# Patient Record
Sex: Female | Born: 1935 | Race: White | Hispanic: No | Smoking: Never smoker
Health system: Southern US, Community
[De-identification: ages and names within clinical notes are randomized; demographics above are authoritative.]

## PROBLEM LIST (undated history)

## (undated) DIAGNOSIS — Z87828 Personal history of other (healed) physical injury and trauma: Secondary | ICD-10-CM

## (undated) DIAGNOSIS — F32A Depression, unspecified: Secondary | ICD-10-CM

## (undated) DIAGNOSIS — E039 Hypothyroidism, unspecified: Secondary | ICD-10-CM

## (undated) DIAGNOSIS — F419 Anxiety disorder, unspecified: Secondary | ICD-10-CM

## (undated) DIAGNOSIS — M1990 Unspecified osteoarthritis, unspecified site: Secondary | ICD-10-CM

## (undated) DIAGNOSIS — I10 Essential (primary) hypertension: Secondary | ICD-10-CM

## (undated) DIAGNOSIS — M719 Bursopathy, unspecified: Secondary | ICD-10-CM

## (undated) DIAGNOSIS — K56609 Unspecified intestinal obstruction, unspecified as to partial versus complete obstruction: Secondary | ICD-10-CM

## (undated) DIAGNOSIS — R1310 Dysphagia, unspecified: Secondary | ICD-10-CM

## (undated) DIAGNOSIS — C300 Malignant neoplasm of nasal cavity: Secondary | ICD-10-CM

## (undated) DIAGNOSIS — K5900 Constipation, unspecified: Secondary | ICD-10-CM

## (undated) DIAGNOSIS — M810 Age-related osteoporosis without current pathological fracture: Secondary | ICD-10-CM

## (undated) DIAGNOSIS — M75100 Unspecified rotator cuff tear or rupture of unspecified shoulder, not specified as traumatic: Secondary | ICD-10-CM

## (undated) DIAGNOSIS — K219 Gastro-esophageal reflux disease without esophagitis: Secondary | ICD-10-CM

## (undated) HISTORY — DX: Dysphagia, unspecified: R13.10

## (undated) HISTORY — PX: OTHER SURGICAL HISTORY: SHX169

## (undated) HISTORY — DX: Hypothyroidism, unspecified: E03.9

## (undated) HISTORY — PX: ABDOMINAL HYSTERECTOMY: SHX81

## (undated) HISTORY — PX: ROTATOR CUFF REPAIR: SHX139

## (undated) HISTORY — PX: CHOLECYSTECTOMY: SHX55

## (undated) HISTORY — DX: Personal history of other (healed) physical injury and trauma: Z87.828

## (undated) HISTORY — DX: Age-related osteoporosis without current pathological fracture: M81.0

## (undated) HISTORY — DX: Unspecified intestinal obstruction, unspecified as to partial versus complete obstruction: K56.609

## (undated) HISTORY — PX: JOINT REPLACEMENT: SHX530

## (undated) HISTORY — DX: Malignant neoplasm of nasal cavity: C30.0

## (undated) HISTORY — PX: HERNIA REPAIR: SHX51

## (undated) HISTORY — DX: Bursopathy, unspecified: M71.9

## (undated) HISTORY — DX: Unspecified rotator cuff tear or rupture of unspecified shoulder, not specified as traumatic: M75.100

## (undated) HISTORY — PX: SMALL INTESTINE SURGERY: SHX150

## (undated) HISTORY — DX: Unspecified osteoarthritis, unspecified site: M19.90

## (undated) HISTORY — DX: Constipation, unspecified: K59.00

---
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
## 2011-10-29 DIAGNOSIS — E785 Hyperlipidemia, unspecified: Secondary | ICD-10-CM | POA: Insufficient documentation

## 2011-10-29 DIAGNOSIS — M1990 Unspecified osteoarthritis, unspecified site: Secondary | ICD-10-CM | POA: Insufficient documentation

## 2011-10-29 DIAGNOSIS — D649 Anemia, unspecified: Secondary | ICD-10-CM | POA: Insufficient documentation

## 2011-10-29 DIAGNOSIS — E538 Deficiency of other specified B group vitamins: Secondary | ICD-10-CM | POA: Insufficient documentation

## 2011-10-29 DIAGNOSIS — J302 Other seasonal allergic rhinitis: Secondary | ICD-10-CM | POA: Insufficient documentation

## 2011-10-29 DIAGNOSIS — K2100 Gastro-esophageal reflux disease with esophagitis, without bleeding: Secondary | ICD-10-CM | POA: Insufficient documentation

## 2013-05-03 DIAGNOSIS — R609 Edema, unspecified: Secondary | ICD-10-CM | POA: Insufficient documentation

## 2014-02-06 DIAGNOSIS — E039 Hypothyroidism, unspecified: Secondary | ICD-10-CM | POA: Insufficient documentation

## 2014-02-06 DIAGNOSIS — I10 Essential (primary) hypertension: Secondary | ICD-10-CM | POA: Insufficient documentation

## 2016-11-30 ENCOUNTER — Encounter: Payer: Self-pay | Admitting: Obstetrics and Gynecology

## 2016-11-30 ENCOUNTER — Ambulatory Visit (INDEPENDENT_AMBULATORY_CARE_PROVIDER_SITE_OTHER): Payer: 59 | Admitting: Obstetrics and Gynecology

## 2016-11-30 DIAGNOSIS — N8189 Other female genital prolapse: Secondary | ICD-10-CM

## 2016-11-30 DIAGNOSIS — R300 Dysuria: Secondary | ICD-10-CM

## 2016-11-30 DIAGNOSIS — R32 Unspecified urinary incontinence: Secondary | ICD-10-CM | POA: Insufficient documentation

## 2016-11-30 DIAGNOSIS — N952 Postmenopausal atrophic vaginitis: Secondary | ICD-10-CM

## 2016-11-30 MED ORDER — ESTROGENS, CONJUGATED 0.625 MG/GM VA CREA: 1.0000 | TOPICAL_CREAM | Freq: Every day | VAGINAL | 12 refills | Status: DC

## 2016-11-30 NOTE — Progress Notes (Signed)
Kayla Lloyd presents today with c/o vaginal pressure, urinary frequency and dysuria. Pt reports a history of chronic UTI's Was treated for such in Sept and Oct by PCP.  She voids 6-10 times a day and 1-2 times a night She wears pads and has so for the last 2 yrs She has stress and some urge incont sx for the last several yrs Has interfered with her ADL's  She is not sexual active. Denies vaginal bleeding but does have problems at times with hemorrhoids secondary to chronic constipation.   She has a hysterectomy in 1955. She has also had several abd surgeries secondary to what sounds like bowel obstruction and hernia repair.  She moved to [place] over the summer from [place]  Last pap smear was years ago as well as mammogram  She has HTN, hypothyroidism anxiety and hyperlipidemia.  [id number]  TSVD x 8 largest 8 # 15 oz  PE AF VSS Lungs clear  Heart RRR Abd soft obese GU vaginal atrophy cystocele and rectocele noted with relaxation and with valsalva Cervix and uterus absent, + bladder tenderness   A/P Urinary Freq/dysuria        Urinary incont        Vaginal atrophy  Will start Premarin vaginal cream 1/2 applicator twice a week. U/R/B reviewed Will culture urine and treat accordingly F/U in 3-4 weeks. Consider referral to UroGyn

## 2016-12-01 ENCOUNTER — Emergency Department (HOSPITAL_COMMUNITY): Payer: 59

## 2016-12-01 ENCOUNTER — Ambulatory Visit (HOSPITAL_COMMUNITY): Payer: 59

## 2016-12-01 ENCOUNTER — Encounter (HOSPITAL_COMMUNITY): Payer: Self-pay | Admitting: Emergency Medicine

## 2016-12-01 ENCOUNTER — Emergency Department (HOSPITAL_COMMUNITY)
Admission: EM | Admit: 2016-12-01 | Discharge: 2016-12-01 | Discharge status: Against Medical Advice | Payer: 59 | Attending: Emergency Medicine | Admitting: Emergency Medicine

## 2016-12-01 DIAGNOSIS — Z5321 Procedure and treatment not carried out due to patient leaving prior to being seen by health care provider: Secondary | ICD-10-CM | POA: Diagnosis not present

## 2016-12-01 HISTORY — DX: Essential (primary) hypertension: I10
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
## 2016-12-02 ENCOUNTER — Telehealth: Payer: Self-pay | Admitting: *Deleted

## 2016-12-02 NOTE — Telephone Encounter (Signed)
-----   Message from Chancy Milroy, MD sent at 12/02/2016  7:51 AM EST ----- Regarding: FW: Order Clarification I tried to change the original Rx but it would not let me I want the Rx to say 1/2 applicator vaginal twice a week Thanks Legrand Como  ----- Message ----- From: Valene Bors, CMA Sent: 12/01/2016  10:33 AM To: Chancy Milroy, MD Subject: Order Clarification                            Dr Rip Harbour  I received a call from the pharmacy needing clarification on Premarin Cream order. It states to 1 applicator daily and 1/2 applicator twice weekly.  Please clarify and I will make pharmacy aware.  Thanks, Vinnie Level

## 2016-12-02 NOTE — Telephone Encounter (Signed)
Called pharmacy and made aware of Rx clarification.

## 2016-12-29 ENCOUNTER — Encounter: Payer: Self-pay | Admitting: Obstetrics and Gynecology

## 2016-12-29 ENCOUNTER — Ambulatory Visit (INDEPENDENT_AMBULATORY_CARE_PROVIDER_SITE_OTHER): Payer: 59 | Admitting: Obstetrics and Gynecology

## 2016-12-29 VITALS — BP 111/69 | HR 101 | Wt 133.0 lb

## 2016-12-29 DIAGNOSIS — R32 Unspecified urinary incontinence: Secondary | ICD-10-CM | POA: Diagnosis not present

## 2016-12-29 DIAGNOSIS — N952 Postmenopausal atrophic vaginitis: Secondary | ICD-10-CM

## 2016-12-29 NOTE — Progress Notes (Signed)
Patient is in the office for follow up.  Pt seen first of Nov with urinary incont and vaginal atrophy. Started on Premarin vaginal cream twice a week Pt reports Sx have improved since starting the cream. Still some fullness at times, less incont Sx Tolerating Premarin cream without problems  PE  Pt declined  A/P Vaginal atrophy        Urinary incont  Will continue with Premarin cream twice a week. Urine culture from previous visit not performed, will complete today F/U in 6 months.

## 2017-01-01 LAB — URINE CULTURE

## 2017-01-04 ENCOUNTER — Other Ambulatory Visit: Payer: Self-pay

## 2017-01-04 DIAGNOSIS — N390 Urinary tract infection, site not specified: Secondary | ICD-10-CM

## 2017-01-04 MED ORDER — SULFAMETHOXAZOLE-TRIMETHOPRIM 800-160 MG PO TABS: 1.0000 | ORAL_TABLET | Freq: Two times a day (BID) | ORAL | 1 refills | Status: DC

## 2017-01-05 ENCOUNTER — Telehealth: Payer: Self-pay

## 2017-01-05 NOTE — Telephone Encounter (Signed)
Rec'vd TC from pharmacy states Septra interacts with pt's Lorsartan causing abn Potassium levels. Consulted with provider. This is our only option to treat her infection for only 7 days. Pt aware and states she has taken Bactrium and Lorsartan together in the past. She will c/b for lab visit once med is delivered or once abx is complete to check potassium level.

## 2017-01-12 ENCOUNTER — Ambulatory Visit: Payer: 59 | Attending: Internal Medicine | Admitting: Physical Therapy

## 2017-01-12 ENCOUNTER — Encounter: Payer: Self-pay | Admitting: Physical Therapy

## 2017-01-12 DIAGNOSIS — G8929 Other chronic pain: Secondary | ICD-10-CM | POA: Insufficient documentation

## 2017-01-12 DIAGNOSIS — M545 Low back pain: Secondary | ICD-10-CM | POA: Insufficient documentation

## 2017-01-12 DIAGNOSIS — R262 Difficulty in walking, not elsewhere classified: Secondary | ICD-10-CM

## 2017-01-12 NOTE — Therapy (Signed)
[address] [place], [place], [address] Phone: [phone]   Fax:  [phone]  Physical Therapy Evaluation  Patient Details  Name: Kayla Lloyd MRN: [id number] Date of Birth: [birth year] Referring Provider: Velna Hatchet   Encounter Date: 01/12/2017  PT End of Session - 01/12/17 1523    Visit Number  1    Date for PT Re-Evaluation  03/15/17    PT Start Time  1355    PT Stop Time  1440    PT Time Calculation (min)  45 min    Activity Tolerance  Patient tolerated treatment well    Behavior During Therapy  [facility] for tasks assessed/performed       Past Medical History:  Diagnosis Date  . Cancer of septum of nose ([place])   . Hypertension   . Hypothyroidism     Past Surgical History:  Procedure Laterality Date  . hysterecrtomy    . left hip surgery    . ROTATOR CUFF REPAIR Right   . SMALL INTESTINE SURGERY      There were no vitals filed for this visit.   Subjective Assessment - 01/12/17 1400    Subjective  Patient reports 23 years of LBP after a fall.  She reports multiple abdominal surgeries, most recent 1975, the granddaughter is with her and reports to me that she is having urinary issues.  Reports no recent x-rays on the spine.      Limitations  Standing;House hold activities    Patient Stated Goals  have less pain    Currently in Pain?  Yes    Pain Score  1     Pain Location  Back    Pain Orientation  Lower    Pain Descriptors / Indicators  Aching    Pain Type  Chronic pain    Pain Onset  More than a month ago    Pain Frequency  Intermittent    Aggravating Factors   any standing, bending, activity, pain up to 9-10/10, has to have an electric cart for shopping    Pain Relieving Factors  sitting, and resting    Effect of Pain on Daily Activities  limits everything, difficult to go out         [facility] PT Assessment - 01/12/17 0001      Assessment   Medical Diagnosis  LBP    Referring Provider  Velna Hatchet    Onset Date/Surgical Date  12/13/16    Prior Therapy  no      Precautions   Precautions  None      Balance Screen   Has the patient fallen in the past 6 months  Yes    How many times?  1    Has the patient had a decrease in activity level because of a fear of falling?   No    Is the patient reluctant to leave their home because of a fear of falling?   Yes      Home Environment   Additional Comments  lives with granddaughter, has a few steps into the home, does light housework      Prior Function   Level of Independence  Independent with household mobility with device    Vocation  Retired      Mining engineer Comments  fwd head, rounded shoulders      ROM / Strength   AROM / PROM / Strength  AROM;Strength  Strength   Overall Strength Comments  4-/5 for the LE's with mild pain, it was very difficult for her to activate her abs      Ambulation/Gait   Gait Comments  uses a SPC, slow shuffling gait, has uneven hips      Standardized Balance Assessment   Standardized Balance Assessment  Timed Up and Go Test      Timed Up and Go Test   Normal TUG (seconds)  33             Objective measurements completed on examination: See above findings.              PT Education - 01/12/17 1523    Education provided  Yes    Education Details  gave HEP about Kegel's, Feet on ball K2C, trunk rotation and isometric abdominals    Person(s) Educated  Patient;Child(ren)    Methods  Explanation;Demonstration;Handout    Comprehension  Verbalized understanding       PT Short Term Goals - 01/12/17 1527      PT SHORT TERM GOAL #1   Title  independent with initial HEP    Time  2    Period  Weeks    Status  New        PT Long Term Goals - 01/12/17 1527      PT LONG TERM GOAL #1   Title  decrease TUG time to 20 seconds    Time  8    Period  Weeks    Status  New      PT LONG TERM GOAL #2   Title  decrease  pain 25% with standing    Time  8    Period  Weeks    Status  New      PT LONG TERM GOAL #3   Title  independent and safe with TENS    Time  8    Period  Weeks    Status  New      PT LONG TERM GOAL #4   Title  report able to stand to cook a meal without difficulty    Time  8    Period  Weeks    Status  New             Plan - 01/12/17 1524    Clinical Impression Statement  Patient reports LBP for 23 years, she reports moving recently to live with granddaughter.  She has a shuffling gait, TUG was 33 seconds, is unsteady on feet.  She has a great difficulty activating her abdominal mms, she reports many abdominal surgeries and recent incontinence.  She has tight LE's    Clinical Presentation  Stable    Clinical Decision Making  Low    Rehab Potential  Fair    PT Frequency  2x / week    PT Duration  8 weeks    PT Treatment/Interventions  ADLs/Self Care Home Management;Electrical Stimulation;Moist Heat;Gait training;Neuromuscular re-education;Balance training;Therapeutic exercise;Therapeutic activities;Functional mobility training;Patient/family education;Manual techniques    PT Next Visit Plan  pateint reports having a TENS but they are unsure how to use it, start exercises for core stability, work on balance too    Consulted and Agree with Plan of Care  Patient       Patient will benefit from skilled therapeutic intervention in order to improve the following deficits and impairments:  Abnormal gait, Cardiopulmonary status limiting activity, Decreased activity tolerance, Decreased balance, Decreased mobility, Decreased strength, Postural dysfunction, Improper body  mechanics, Impaired flexibility, Pain, Difficulty walking, Decreased range of motion  Visit Diagnosis: Chronic bilateral low back pain without sciatica - Plan: PT plan of care cert/re-cert  Difficulty in walking, not elsewhere classified - Plan: PT plan of care cert/re-cert  G-Codes - 95/39/67 1529    Functional  Assessment Tool Used (Outpatient Only)  foto 63%    Functional Limitation  Mobility: Walking and moving around    Mobility: Walking and Moving Around Current Status [phone])  At least 60 percent but less than 80 percent impaired, limited or restricted    Mobility: Walking and Moving Around Goal Status [phone])  At least 40 percent but less than 60 percent impaired, limited or restricted        Problem List Patient Active Problem List   Diagnosis Date Noted  . Urinary incontinence in female 11/30/2016  . Pelvic relaxation 11/30/2016  . Dysuria 11/30/2016  . Vaginal atrophy 11/30/2016    Sumner Boast., PT 01/12/2017, 3:33 PM  [address] [place], [place], [address] Phone: [phone]   Fax:  [phone]  Name: Kayla Lloyd MRN: [id number] Date of Birth: 1923-10-17

## 2017-01-13 ENCOUNTER — Telehealth: Payer: Self-pay | Admitting: Pediatrics

## 2017-01-13 DIAGNOSIS — Z79899 Other long term (current) drug therapy: Secondary | ICD-10-CM

## 2017-01-13 NOTE — Telephone Encounter (Signed)
Pt left message on nurse line stating she has finished her ATB and would like to make appointment for her follow up lab work.  Per previous note pt to have K drawn after she finished Bactrim.  I called patient back and offered lab appointment for next week.  She states she will have to call me back when her great grand daughter wakes up, she will be bringing her.  I advised of next weeks office hours. She voiced understanding and states she will call back today.

## 2017-01-19 ENCOUNTER — Other Ambulatory Visit: Payer: 59

## 2017-01-19 DIAGNOSIS — Z79899 Other long term (current) drug therapy: Secondary | ICD-10-CM

## 2017-01-20 LAB — BASIC METABOLIC PANEL
BUN / CREAT RATIO: 20 (ref 12–28)
BUN: 13 mg/dL (ref 10–36)
CALCIUM: 9.5 mg/dL (ref 8.7–10.3)
CHLORIDE: 98 mmol/L (ref 96–106)
CO2: 26 mmol/L (ref 20–29)
Creatinine, Ser: 0.66 mg/dL (ref 0.57–1.00)
GFR calc non Af Amer: 77 mL/min/{1.73_m2} (ref >59)
GFR, EST AFRICAN AMERICAN: 89 mL/min/{1.73_m2} (ref >59)
GLUCOSE: 96 mg/dL (ref 65–99)
POTASSIUM: 4.8 mmol/L (ref 3.5–5.2)
Sodium: 138 mmol/L (ref 134–144)

## 2017-02-07 ENCOUNTER — Encounter: Payer: Self-pay | Admitting: Physical Therapy

## 2017-02-07 ENCOUNTER — Ambulatory Visit: Payer: 59 | Attending: Internal Medicine | Admitting: Physical Therapy

## 2017-02-07 DIAGNOSIS — G8929 Other chronic pain: Secondary | ICD-10-CM | POA: Diagnosis present

## 2017-02-07 DIAGNOSIS — R262 Difficulty in walking, not elsewhere classified: Secondary | ICD-10-CM | POA: Diagnosis not present

## 2017-02-07 DIAGNOSIS — M545 Low back pain: Secondary | ICD-10-CM | POA: Diagnosis present

## 2017-02-07 NOTE — Therapy (Signed)
[address] [place], [place], [address] Phone: [phone]   Fax:  [phone]  Physical Therapy Treatment  Patient Details  Name: Kayla Lloyd MRN: [id number] Date of Birth: Mar 01, 1923 Referring Provider: Velna Hatchet   Encounter Date: 02/07/2017  PT End of Session - 02/07/17 1521    Visit Number  2    Date for PT Re-Evaluation  03/15/17    PT Start Time  1430    PT Stop Time  1519    PT Time Calculation (min)  49 min       Past Medical History:  Diagnosis Date  . Cancer of septum of nose ([place])   . Hypertension   . Hypothyroidism     Past Surgical History:  Procedure Laterality Date  . hysterecrtomy    . left hip surgery    . ROTATOR CUFF REPAIR Right   . SMALL INTESTINE SURGERY      There were no vitals filed for this visit.  Subjective Assessment - 02/07/17 1431    Subjective  Pt reports that's her days start off in pain, reports its take a few hours to get around and get going.     Currently in Pain?  No/denies    Pain Score  0-No pain                      [facility] Adult PT Treatment/Exercise - 02/07/17 0001      High Level Balance   High Level Balance Activities  Side stepping HHA x 2      Exercises   Exercises  Lumbar      Lumbar Exercises: Aerobic   Nustep  L1 x 5 min      Lumbar Exercises: Standing   Other Standing Lumbar Exercises  march x10 CGA & HHA x2     Other Standing Lumbar Exercises  Alt box taps 2in 2x5       Lumbar Exercises: Seated   Sit to Stand  5 reps RUE use     Other Seated Lumbar Exercises  abd crunches physo ball x10; Seated HS curls yellow 2x10     Other Seated Lumbar Exercises  seated march 2x10; fitter presses 1 blue 2x10               PT Short Term Goals - 02/07/17 1521      PT SHORT TERM GOAL #1   Title  independent with initial HEP    Status  Achieved        PT Long Term Goals - 01/12/17 1527      PT LONG  TERM GOAL #1   Title  decrease TUG time to 20 seconds    Time  8    Period  Weeks    Status  New      PT LONG TERM GOAL #2   Title  decrease pain 25% with standing    Time  8    Period  Weeks    Status  New      PT LONG TERM GOAL #3   Title  independent and safe with TENS    Time  8    Period  Weeks    Status  New      PT LONG TERM GOAL #4   Title  report able to stand to cook a meal without difficulty    Time  8    Period  Weeks  Status  New            Plan - 02/07/17 1521    Clinical Impression Statement  Pt tolerated an initial progression to exercises well, cues not to lean back with seated march. Pt reports a sore sensation in her bones with sit to stand. PT really liked NuStep and fitter pressed. Instructed pt and pt granddaughter on proper TENS use ans set up. Tole pt to avoid LE rotations with LE on physo ball from HEP.   Rehab Potential  Fair    PT Frequency  2x / week    PT Duration  8 weeks    PT Treatment/Interventions  ADLs/Self Care Home Management;Electrical Stimulation;Moist Heat;Gait training;Neuromuscular re-education;Balance training;Therapeutic exercise;Therapeutic activities;Functional mobility training;Patient/family education;Manual techniques    PT Next Visit Plan  exercises for core stability, work on balance too       Patient will benefit from skilled therapeutic intervention in order to improve the following deficits and impairments:  Abnormal gait, Cardiopulmonary status limiting activity, Decreased activity tolerance, Decreased balance, Decreased mobility, Decreased strength, Postural dysfunction, Improper body mechanics, Impaired flexibility, Pain, Difficulty walking, Decreased range of motion  Visit Diagnosis: Difficulty in walking, not elsewhere classified  Chronic bilateral low back pain without sciatica     Problem List Patient Active Problem List   Diagnosis Date Noted  . Urinary incontinence in female 11/30/2016  . Pelvic  relaxation 11/30/2016  . Dysuria 11/30/2016  . Vaginal atrophy 11/30/2016    Scot Jun 02/07/2017, 3:23 PM  [address] [place], [place], [address] Phone: [phone]   Fax:  [phone]  Name: Kayla Lloyd MRN: [id number] Date of Birth: 1923/12/31

## 2017-02-14 ENCOUNTER — Encounter: Payer: Self-pay | Admitting: Physical Therapy

## 2017-02-14 ENCOUNTER — Ambulatory Visit: Payer: 59 | Admitting: Physical Therapy

## 2017-02-14 DIAGNOSIS — M545 Low back pain, unspecified: Secondary | ICD-10-CM

## 2017-02-14 DIAGNOSIS — R262 Difficulty in walking, not elsewhere classified: Secondary | ICD-10-CM

## 2017-02-14 DIAGNOSIS — G8929 Other chronic pain: Secondary | ICD-10-CM

## 2017-02-14 NOTE — Therapy (Signed)
[address] [place], [place], [address] Phone: [phone]   Fax:  [phone]  Physical Therapy Treatment  Patient Details  Name: Kayla Lloyd MRN: [id number] Date of Birth: 04/07/1923 Referring Provider: Velna Hatchet   Encounter Date: 02/14/2017  PT End of Session - 02/14/17 1426    Visit Number  3    Date for PT Re-Evaluation  03/15/17    PT Start Time  1345    PT Stop Time  1430    PT Time Calculation (min)  45 min    Activity Tolerance  Patient tolerated treatment well    Behavior During Therapy  [facility] for tasks assessed/performed       Past Medical History:  Diagnosis Date  . Cancer of septum of nose ([place])   . Hypertension   . Hypothyroidism     Past Surgical History:  Procedure Laterality Date  . hysterecrtomy    . left hip surgery    . ROTATOR CUFF REPAIR Right   . SMALL INTESTINE SURGERY      There were no vitals filed for this visit.  Subjective Assessment - 02/14/17 1351    Subjective  Pt reports that's she is doing fine. Pt's granddaughter stated that's he said the exercises hurt her back and it was hurting last night, But Pt reported that she has not done her exercises since last week. Pt stated that laying in bed hurt her back last night    Currently in Pain?  No/denies    Pain Score  0-No pain                      [facility] Adult PT Treatment/Exercise - 02/14/17 0001      High Level Balance   High Level Balance Activities  Side stepping;Backward walking HHA x2       Lumbar Exercises: Aerobic   Nustep  L3 x 6 min      Lumbar Exercises: Seated   Long Arc Quad on Chair  2 sets;10 reps;Weights;Both    LAQ on Chair Weights (lbs)  2    Sit to Stand  5 reps x2, airex on mat table, UE on knees     Other Seated Lumbar Exercises   Seated HS curls red 2x12     Other Seated Lumbar Exercises  seated march 2x10; fitter presses 1 black 2x10                PT Short Term Goals - 02/07/17 1521      PT SHORT TERM GOAL #1   Title  independent with initial HEP    Status  Achieved        PT Long Term Goals - 01/12/17 1527      PT LONG TERM GOAL #1   Title  decrease TUG time to 20 seconds    Time  8    Period  Weeks    Status  New      PT LONG TERM GOAL #2   Title  decrease pain 25% with standing    Time  8    Period  Weeks    Status  New      PT LONG TERM GOAL #3   Title  independent and safe with TENS    Time  8    Period  Weeks    Status  New      PT LONG TERM GOAL #4   Title  report able to stand to cook a meal without difficulty    Time  8    Period  Weeks    Status  New            Plan - 02/14/17 1426    Clinical Impression Statement  Pt does well with today's exercises, she was able to tolerated increase resistance with fitter press HS curls, and LAQ. No reports of increase pain. HHA x2 with balance activities.    PT Frequency  2x / week    PT Duration  8 weeks    PT Treatment/Interventions  ADLs/Self Care Home Management;Electrical Stimulation;Moist Heat;Gait training;Neuromuscular re-education;Balance training;Therapeutic exercise;Therapeutic activities;Functional mobility training;Patient/family education;Manual techniques    PT Next Visit Plan  exercises for core stability, work on balance too       Patient will benefit from skilled therapeutic intervention in order to improve the following deficits and impairments:  Abnormal gait, Cardiopulmonary status limiting activity, Decreased activity tolerance, Decreased balance, Decreased mobility, Decreased strength, Postural dysfunction, Improper body mechanics, Impaired flexibility, Pain, Difficulty walking, Decreased range of motion  Visit Diagnosis: Difficulty in walking, not elsewhere classified  Chronic bilateral low back pain without sciatica     Problem List Patient Active Problem List   Diagnosis Date Noted  . Urinary  incontinence in female 11/30/2016  . Pelvic relaxation 11/30/2016  . Dysuria 11/30/2016  . Vaginal atrophy 11/30/2016    Scot Jun, PTA 02/14/2017, 2:28 PM  [address] [place], [place], [address] Phone: [phone]   Fax:  [phone]  Name: Harvey Lingo MRN: [id number] Date of Birth: Jan 29, 1923

## 2017-02-17 ENCOUNTER — Ambulatory Visit: Payer: 59 | Admitting: Physical Therapy

## 2017-02-17 ENCOUNTER — Encounter: Payer: Self-pay | Admitting: Physical Therapy

## 2017-02-17 DIAGNOSIS — R262 Difficulty in walking, not elsewhere classified: Secondary | ICD-10-CM

## 2017-02-17 DIAGNOSIS — M545 Low back pain: Principal | ICD-10-CM

## 2017-02-17 DIAGNOSIS — G8929 Other chronic pain: Secondary | ICD-10-CM

## 2017-02-17 NOTE — Therapy (Signed)
[address] [place], [place], [address] Phone: [phone]   Fax:  [phone]  Physical Therapy Treatment  Patient Details  Name: Kayla Lloyd MRN: [id number] Date of Birth: Dec 02, 1923 Referring Provider: Velna Hatchet   Encounter Date: 02/17/2017  PT End of Session - 02/17/17 1426    Visit Number  4    Date for PT Re-Evaluation  03/15/17    PT Start Time  1340    PT Stop Time  1430    PT Time Calculation (min)  50 min    Activity Tolerance  Patient tolerated treatment well    Behavior During Therapy  [facility] for tasks assessed/performed       Past Medical History:  Diagnosis Date  . Cancer of septum of nose ([place])   . Hypertension   . Hypothyroidism     Past Surgical History:  Procedure Laterality Date  . hysterecrtomy    . left hip surgery    . ROTATOR CUFF REPAIR Right   . SMALL INTESTINE SURGERY      There were no vitals filed for this visit.  Subjective Assessment - 02/17/17 1343    Subjective  "I have been doing pretty good"    Currently in Pain?  No/denies    Pain Score  0-No pain                      [facility] Adult PT Treatment/Exercise - 02/17/17 0001      High Level Balance   High Level Balance Activities  Side stepping;Backward walking      Lumbar Exercises: Aerobic   Nustep  L3 x 6 min      Lumbar Exercises: Standing   Other Standing Lumbar Exercises  marching       Lumbar Exercises: Seated   Long Arc Quad on Chair  2 sets;10 reps;Weights;Both    LAQ on Chair Weights (lbs)  2    Sit to Stand  5 reps UE on legs,cue to keep LE from pushing on mat table     Other Seated Lumbar Exercises   Seated HS curls red 2x12; rows red 2x10    Other Seated Lumbar Exercises  ball squeezes 2x10                PT Short Term Goals - 02/07/17 1521      PT SHORT TERM GOAL #1   Title  independent with initial HEP    Status  Achieved        PT Long Term  Goals - 02/17/17 1353      PT LONG TERM GOAL #2   Title  decrease pain 25% with standing    Status  Partially Met      PT LONG TERM GOAL #3   Title  independent and safe with TENS    Status  Achieved            Plan - 02/17/17 1427    Clinical Impression Statement  Pt is progressing towards goals, she becomes very fatigue with functional activities. CGA assist with all balance interventions. No reports of increase pain. One restroom break during today's treatment.    Rehab Potential  Fair    PT Frequency  2x / week    PT Duration  8 weeks    PT Treatment/Interventions  ADLs/Self Care Home Management;Electrical Stimulation;Moist Heat;Gait training;Neuromuscular re-education;Balance training;Therapeutic exercise;Therapeutic activities;Functional mobility training;Patient/family education;Manual techniques    PT Next  Visit Plan  exercises for core stability, work on balance too       Patient will benefit from skilled therapeutic intervention in order to improve the following deficits and impairments:  Abnormal gait, Cardiopulmonary status limiting activity, Decreased activity tolerance, Decreased balance, Decreased mobility, Decreased strength, Postural dysfunction, Improper body mechanics, Impaired flexibility, Pain, Difficulty walking, Decreased range of motion  Visit Diagnosis: Chronic bilateral low back pain without sciatica  Difficulty in walking, not elsewhere classified     Problem List Patient Active Problem List   Diagnosis Date Noted  . Urinary incontinence in female 11/30/2016  . Pelvic relaxation 11/30/2016  . Dysuria 11/30/2016  . Vaginal atrophy 11/30/2016    Scot Jun, PTA 02/17/2017, 2:29 PM  [address] [place], [place], [address] Phone: [phone]   Fax:  [phone]  Name: Kayla Lloyd MRN: [id number] Date of Birth: [birth year]

## 2017-02-23 ENCOUNTER — Ambulatory Visit: Payer: 59 | Admitting: Physical Therapy

## 2017-02-23 DIAGNOSIS — G8929 Other chronic pain: Secondary | ICD-10-CM

## 2017-02-23 DIAGNOSIS — R262 Difficulty in walking, not elsewhere classified: Secondary | ICD-10-CM | POA: Diagnosis not present

## 2017-02-23 DIAGNOSIS — M545 Low back pain: Principal | ICD-10-CM

## 2017-02-23 NOTE — Therapy (Signed)
[place], [place], [address] Phone: [phone]   Fax:  [phone]  Physical Therapy Treatment  Patient Details  Name: Kayla Lloyd MRN: [id number] Date of Birth: 1923/03/23 Referring Provider: Velna Hatchet   Encounter Date: 02/23/2017  PT End of Session - 02/23/17 1453    Visit Number  5    Date for PT Re-Evaluation  03/15/17    PT Start Time  1447    PT Stop Time  1525    PT Time Calculation (min)  38 min    Activity Tolerance  Patient tolerated treatment well    Behavior During Therapy  [facility] for tasks assessed/performed       Past Medical History:  Diagnosis Date  . Cancer of septum of nose ([place])   . Hypertension   . Hypothyroidism     Past Surgical History:  Procedure Laterality Date  . hysterecrtomy    . left hip surgery    . ROTATOR CUFF REPAIR Right   . SMALL INTESTINE SURGERY      There were no vitals filed for this visit.  Subjective Assessment - 02/23/17 1453    Subjective  Pt. doing well today, with no new complaints.      Patient Stated Goals  have less pain    Currently in Pain?  No/denies    Pain Score  0-No pain    Multiple Pain Sites  No                      [facility] Adult PT Treatment/Exercise - 02/23/17 1455      Neuro Re-ed    Neuro Re-ed Details   Side stepping with 2 HH assist from therapist 2 x 20 ft; forward/backward walk with 2 HH assist x 20 ft each       Lumbar Exercises: Aerobic   Nustep  L3 x 6 min      Lumbar Exercises: Standing   Other Standing Lumbar Exercises  Alternating toe-clears to "edge of rebounder" x 10 reps each LE with SPC and CGA from therapist       Lumbar Exercises: [place] on Chair  2 sets;10 reps;Weights;Both    LAQ on Chair Weights (lbs)  2    Sit to Stand  5 reps 2 sets; UE pushoff from knees     Other Seated Lumbar Exercises  Adduction ball squeeze 5" x 10 reps     Other Seated Lumbar  Exercises  Seated march x 15 reps each side       Knee/Hip Exercises: Seated   Other Seated Knee/Hip Exercises  Toe raises x 10 reps     Other Seated Knee/Hip Exercises  Heel raise x 10 reps     Hamstring Curl  Right;Left 12reps    Hamstring Limitations  red TB       Manual Therapy   Manual Therapy  Soft tissue mobilization    Manual therapy comments  seated     Soft tissue mobilization  STM to R rhomboids area over area of reported tenderness                PT Short Term Goals - 02/07/17 1521      PT SHORT TERM GOAL #1   Title  independent with initial HEP    Status  Achieved        PT Long Term Goals - 02/17/17 1353  PT LONG TERM GOAL #2   Title  decrease pain 25% with standing    Status  Partially Met      PT LONG TERM GOAL #3   Title  independent and safe with TENS    Status  Achieved            Plan - 02/23/17 1454    Clinical Impression Statement  Pt. doing well today with no new complaints.  Tolerated mild progression of LE strengthening and addition of staggered stance balance well today.  Pt. requiring CGA assist with all standing balance activities today.  Verbalized fatigue following session today.  Seems to be progressing well toward goals at this point.    PT Treatment/Interventions  ADLs/Self Care Home Management;Electrical Stimulation;Moist Heat;Gait training;Neuromuscular re-education;Balance training;Therapeutic exercise;Therapeutic activities;Functional mobility training;Patient/family education;Manual techniques    PT Next Visit Plan  exercises for core stability, work on balance too    Consulted and Agree with Plan of Care  Patient       Patient will benefit from skilled therapeutic intervention in order to improve the following deficits and impairments:  Abnormal gait, Cardiopulmonary status limiting activity, Decreased activity tolerance, Decreased balance, Decreased mobility, Decreased strength, Postural dysfunction, Improper body  mechanics, Impaired flexibility, Pain, Difficulty walking, Decreased range of motion  Visit Diagnosis: Chronic bilateral low back pain without sciatica  Difficulty in walking, not elsewhere classified     Problem List Patient Active Problem List   Diagnosis Date Noted  . Urinary incontinence in female 11/30/2016  . Pelvic relaxation 11/30/2016  . Dysuria 11/30/2016  . Vaginal atrophy 11/30/2016    Bess Harvest, PTA 02/23/17 3:34 PM  [place] [address] [place], [place], [address] Phone: [phone]   Fax:  [phone]  Name: Kayla Lloyd MRN: [id number] Date of Birth: 1923-02-26

## 2017-02-28 ENCOUNTER — Ambulatory Visit: Payer: 59 | Attending: Internal Medicine | Admitting: Physical Therapy

## 2017-02-28 ENCOUNTER — Encounter: Payer: Self-pay | Admitting: Physical Therapy

## 2017-02-28 DIAGNOSIS — R262 Difficulty in walking, not elsewhere classified: Secondary | ICD-10-CM | POA: Diagnosis present

## 2017-02-28 DIAGNOSIS — M545 Low back pain, unspecified: Secondary | ICD-10-CM

## 2017-02-28 DIAGNOSIS — G8929 Other chronic pain: Secondary | ICD-10-CM | POA: Diagnosis present

## 2017-02-28 NOTE — Therapy (Signed)
[place], [place], [address] Phone: [phone]   Fax:  [phone]  Physical Therapy Treatment  Patient Details  Name: Kayla Lloyd MRN: [id number] Date of Birth: 1923/01/26 Referring Provider: Velna Hatchet   Encounter Date: 02/28/2017  PT End of Session - 02/28/17 1426    Visit Number  6    Date for PT Re-Evaluation  03/15/17    PT Start Time  1350    PT Stop Time  1430    PT Time Calculation (min)  40 min    Activity Tolerance  Patient tolerated treatment well    Behavior During Therapy  [facility] for tasks assessed/performed       Past Medical History:  Diagnosis Date  . Cancer of septum of nose ([place])   . Hypertension   . Hypothyroidism     Past Surgical History:  Procedure Laterality Date  . hysterecrtomy    . left hip surgery    . ROTATOR CUFF REPAIR Right   . SMALL INTESTINE SURGERY      There were no vitals filed for this visit.  Subjective Assessment - 02/28/17 1355    Subjective  Pt reports that she drove today and she rode her exercise bike last night for twenty minutes    Currently in Pain?  No/denies    Pain Score  0-No pain         [facility] PT Assessment - 02/28/17 0001      Standardized Balance Assessment   Standardized Balance Assessment  Timed Up and Go Test SPC      Timed Up and Go Test   Normal TUG (seconds)  16.29                  [facility] Adult PT Treatment/Exercise - 02/28/17 0001      High Level Balance   High Level Balance Activities  Side stepping;Backward walking      Lumbar Exercises: Aerobic   Nustep  L3 x 6 min      Lumbar Exercises: Seated   Long Arc Quad on Chair  2 sets;10 reps;Weights;Both    LAQ on Chair Weights (lbs)  3    Sit to Stand  5 reps x3 UE on knees     Other Seated Lumbar Exercises  Adduction ball squeeze 5" x 10 reps     Other Seated Lumbar Exercises  Seated HS curls red tband 2x15               PT  Short Term Goals - 02/07/17 1521      PT SHORT TERM GOAL #1   Title  independent with initial HEP    Status  Achieved        PT Long Term Goals - 02/28/17 1404      PT LONG TERM GOAL #1   Title  decrease TUG time to 20 seconds    Status  Achieved            Plan - 02/28/17 1428    Clinical Impression Statement  Pt with some hesitation with balance exercises without cane or support, but able to completed well. verbal  and tactile cues given to keep his straight with side stepping. Pt has progressed completing his TUG goal. Pt does reports some LE fatigue with curls and extensions.    Rehab Potential  Fair    PT Frequency  2x / week    PT Duration  8  weeks    PT Treatment/Interventions  ADLs/Self Care Home Management;Electrical Stimulation;Moist Heat;Gait training;Neuromuscular re-education;Balance training;Therapeutic exercise;Therapeutic activities;Functional mobility training;Patient/family education;Manual techniques    PT Next Visit Plan  exercises for core stability, work on balance too       Patient will benefit from skilled therapeutic intervention in order to improve the following deficits and impairments:  Abnormal gait, Cardiopulmonary status limiting activity, Decreased activity tolerance, Decreased balance, Decreased mobility, Decreased strength, Postural dysfunction, Improper body mechanics, Impaired flexibility, Pain, Difficulty walking, Decreased range of motion  Visit Diagnosis: Chronic bilateral low back pain without sciatica  Difficulty in walking, not elsewhere classified     Problem List Patient Active Problem List   Diagnosis Date Noted  . Urinary incontinence in female 11/30/2016  . Pelvic relaxation 11/30/2016  . Dysuria 11/30/2016  . Vaginal atrophy 11/30/2016    Scot Jun, PTA 02/28/2017, 2:30 PM  [place] [address] [place], [place], [address] Phone: [phone]    Fax:  [phone]  Name: Kayla Lloyd MRN: [id number] Date of Birth: 04/21/1923

## 2017-03-02 ENCOUNTER — Ambulatory Visit: Payer: 59 | Admitting: Physical Therapy

## 2017-03-02 ENCOUNTER — Encounter: Payer: Self-pay | Admitting: Physical Therapy

## 2017-03-02 DIAGNOSIS — M545 Low back pain: Principal | ICD-10-CM

## 2017-03-02 DIAGNOSIS — G8929 Other chronic pain: Secondary | ICD-10-CM

## 2017-03-02 DIAGNOSIS — R262 Difficulty in walking, not elsewhere classified: Secondary | ICD-10-CM

## 2017-03-02 NOTE — Therapy (Signed)
[place], [place], [address] Phone: [phone]   Fax:  [phone]  Physical Therapy Treatment  Patient Details  Name: Kayla Lloyd MRN: [id number] Date of Birth: [birth year] Referring Provider: Velna Hatchet   Encounter Date: 03/02/2017  PT End of Session - 03/02/17 1428    Visit Number  7    Date for PT Re-Evaluation  03/15/17    PT Start Time  1347    PT Stop Time  1428    PT Time Calculation (min)  41 min    Activity Tolerance  Patient tolerated treatment well    Behavior During Therapy  [facility] for tasks assessed/performed       Past Medical History:  Diagnosis Date  . Cancer of septum of nose ([place])   . Hypertension   . Hypothyroidism     Past Surgical History:  Procedure Laterality Date  . hysterecrtomy    . left hip surgery    . ROTATOR CUFF REPAIR Right   . SMALL INTESTINE SURGERY      There were no vitals filed for this visit.  Subjective Assessment - 03/02/17 1355    Subjective  "I has some sore legs last time I worked here"    Pain Score  5     Pain Location  Shoulder    Pain Orientation  Left    Pain Descriptors / Indicators  Sore                      [facility] Adult PT Treatment/Exercise - 03/02/17 0001      High Level Balance   High Level Balance Activities  Side stepping      Lumbar Exercises: Aerobic   UBE (Upper Arm Bike)  L1 20frd/2rev    Nustep  L2 x 5 min      Lumbar Exercises: Machines for Strengthening   Cybex Knee Extension  5lb 2x10    Cybex Knee Flexion  15lb 2x10      Lumbar Exercises: Standing   Other Standing Lumbar Exercises  standing march HHA x2 2x10       Lumbar Exercises: Seated   Sit to Stand  10 reps x2 from lowered UBE seat     Other Seated Lumbar Exercises  Adduction ball squeeze 2x10 reps; Hip abd x15                PT Short Term Goals - 02/07/17 1521      PT SHORT TERM GOAL #1   Title  independent with  initial HEP    Status  Achieved        PT Long Term Goals - 02/28/17 1404      PT LONG TERM GOAL #1   Title  decrease TUG time to 20 seconds    Status  Achieved            Plan - 03/02/17 1428    Clinical Impression Statement  Progressed pt to some machine level interventions, pt reports that she can really feel her muscles with seated legs and curl and extensions. Better tolerance with sit to stand from a slightly higher seat.     Rehab Potential  Fair    PT Frequency  2x / week    PT Duration  8 weeks    PT Next Visit Plan  exercises for core stability, work on balance too       Patient will benefit from  skilled therapeutic intervention in order to improve the following deficits and impairments:  Abnormal gait, Cardiopulmonary status limiting activity, Decreased activity tolerance, Decreased balance, Decreased mobility, Decreased strength, Postural dysfunction, Improper body mechanics, Impaired flexibility, Pain, Difficulty walking, Decreased range of motion  Visit Diagnosis: Chronic bilateral low back pain without sciatica  Difficulty in walking, not elsewhere classified     Problem List Patient Active Problem List   Diagnosis Date Noted  . Urinary incontinence in female 11/30/2016  . Pelvic relaxation 11/30/2016  . Dysuria 11/30/2016  . Vaginal atrophy 11/30/2016    Scot Kayla, PTA 03/02/2017, 2:32 PM  [place] [address] [place], [place], [address] Phone: [phone]   Fax:  [phone]  Name: Kayla Lloyd MRN: [id number] Date of Birth: 29-Nov-1923

## 2017-03-07 ENCOUNTER — Ambulatory Visit: Payer: 59 | Admitting: Physical Therapy

## 2017-03-09 ENCOUNTER — Ambulatory Visit: Payer: 59 | Admitting: Physical Therapy

## 2017-03-09 ENCOUNTER — Encounter: Payer: Self-pay | Admitting: Physical Therapy

## 2017-03-09 DIAGNOSIS — M545 Low back pain, unspecified: Secondary | ICD-10-CM

## 2017-03-09 DIAGNOSIS — G8929 Other chronic pain: Secondary | ICD-10-CM

## 2017-03-09 DIAGNOSIS — R262 Difficulty in walking, not elsewhere classified: Secondary | ICD-10-CM

## 2017-03-09 NOTE — Therapy (Signed)
[place], [place], [address] Phone: [phone]   Fax:  [phone]  Physical Therapy Treatment  Patient Details  Name: Kayla Lloyd MRN: [id number] Date of Birth: 17-Apr-1923 Referring Provider: Velna Hatchet   Encounter Date: 03/09/2017  PT End of Session - 03/09/17 1348    Visit Number  8    Date for PT Re-Evaluation  03/15/17    PT Start Time  1300    PT Stop Time  1348    PT Time Calculation (min)  48 min    Activity Tolerance  Patient tolerated treatment well    Behavior During Therapy  [facility] for tasks assessed/performed       Past Medical History:  Diagnosis Date  . Cancer of septum of nose ([place])   . Hypertension   . Hypothyroidism     Past Surgical History:  Procedure Laterality Date  . hysterecrtomy    . left hip surgery    . ROTATOR CUFF REPAIR Right   . SMALL INTESTINE SURGERY      There were no vitals filed for this visit.  Subjective Assessment - 03/09/17 1309    Subjective  "I am feeling pretty good today" Pt reports that she has a sick stomach Monday causing her legs to be weak.    Currently in Pain?  No/denies    Pain Score  0-No pain                      [facility] Adult PT Treatment/Exercise - 03/09/17 0001      High Level Balance   High Level Balance Activities  Marching forwards;Backward walking      Lumbar Exercises: Aerobic   Nustep  L4 x 6 min      Lumbar Exercises: Seated   Long Arc Quad on Chair  2 sets;10 reps;Weights;Both    LAQ on Chair Weights (lbs)  3    Sit to Stand  10 reps    Other Seated Lumbar Exercises  Adduction ball squeeze 2x10 reps; Hip abd x15     Other Seated Lumbar Exercises  Seated HS curls red tband 2x15, seated rows 2x10 red tband               PT Short Term Goals - 02/07/17 1521      PT SHORT TERM GOAL #1   Title  independent with initial HEP    Status  Achieved        PT Long Term Goals -  02/28/17 1404      PT LONG TERM GOAL #1   Title  decrease TUG time to 20 seconds    Status  Achieved            Plan - 03/09/17 1350    Clinical Impression Statement  Pt reports some quad soreness with LAQ. HHA x 2 during forward marching at a comitance pace, pt becomes very fatigue with this. Sit to stand performed without UE from mat table.     Rehab Potential  Fair    PT Frequency  2x / week    PT Duration  8 weeks    PT Treatment/Interventions  ADLs/Self Care Home Management;Electrical Stimulation;Moist Heat;Gait training;Neuromuscular re-education;Balance training;Therapeutic exercise;Therapeutic activities;Functional mobility training;Patient/family education;Manual techniques    PT Next Visit Plan  exercises for core stability, work on balance too       Patient will benefit from skilled therapeutic intervention in order to improve  the following deficits and impairments:  Abnormal gait, Cardiopulmonary status limiting activity, Decreased activity tolerance, Decreased balance, Decreased mobility, Decreased strength, Postural dysfunction, Improper body mechanics, Impaired flexibility, Pain, Difficulty walking, Decreased range of motion  Visit Diagnosis: Chronic bilateral low back pain without sciatica  Difficulty in walking, not elsewhere classified     Problem List Patient Active Problem List   Diagnosis Date Noted  . Urinary incontinence in female 11/30/2016  . Pelvic relaxation 11/30/2016  . Dysuria 11/30/2016  . Vaginal atrophy 11/30/2016    Scot Jun, PTA 03/09/2017, 1:51 PM  [place] [address] [place] [place], [place], [address] Phone: [phone]   Fax:  [phone]  Name: Kayla Lloyd MRN: [id number] Date of Birth: 03/31/1923

## 2017-03-23 ENCOUNTER — Ambulatory Visit: Payer: 59 | Admitting: Physical Therapy

## 2017-03-23 ENCOUNTER — Encounter: Payer: Self-pay | Admitting: Physical Therapy

## 2017-03-23 DIAGNOSIS — M545 Low back pain, unspecified: Secondary | ICD-10-CM

## 2017-03-23 DIAGNOSIS — G8929 Other chronic pain: Secondary | ICD-10-CM

## 2017-03-23 DIAGNOSIS — R262 Difficulty in walking, not elsewhere classified: Secondary | ICD-10-CM

## 2017-03-23 NOTE — Therapy (Signed)
[place], [place], [address] Phone: [phone]   Fax:  [phone]  Physical Therapy Treatment  Patient Details  Name: Kayla Lloyd MRN: [id number] Date of Birth: April 18, 1923 Referring Provider: Velna Hatchet   Encounter Date: 03/23/2017  PT End of Session - 03/23/17 1513    Visit Number  9    Date for PT Re-Evaluation  04/22/17    PT Start Time  1345    PT Stop Time  1433    PT Time Calculation (min)  48 min    Activity Tolerance  Patient tolerated treatment well    Behavior During Therapy  [facility] for tasks assessed/performed       Past Medical History:  Diagnosis Date  . Cancer of septum of nose ([place])   . Hypertension   . Hypothyroidism     Past Surgical History:  Procedure Laterality Date  . hysterecrtomy    . left hip surgery    . ROTATOR CUFF REPAIR Right   . SMALL INTESTINE SURGERY      There were no vitals filed for this visit.  Subjective Assessment - 03/23/17 1343    Subjective  Patient continues to report "legs feel weak", she reports no falls, reports some cramps in the calves    Currently in Pain?  No/denies                      [facility] Adult PT Treatment/Exercise - 03/23/17 0001      High Level Balance   High Level Balance Activities  Backward walking;Side stepping;Marching forwards;Marching backwards      Lumbar Exercises: Stretches   Gastroc Stretch  4 reps;10 seconds      Lumbar Exercises: Aerobic   Nustep  L4 x 6 min      Lumbar Exercises: Machines for Strengthening   Cybex Knee Extension  no weight with therapist holding the pin 3x 7 reps    Cybex Knee Flexion  15lb 3x10      Lumbar Exercises: Seated   Other Seated Lumbar Exercises  Adduction ball squeeze 2x10 reps; Hip abd x15     Other Seated Lumbar Exercises  physioball in lap working on isometric abs               PT Short Term Goals - 02/07/17 1521      PT SHORT TERM  GOAL #1   Title  independent with initial HEP    Status  Achieved        PT Long Term Goals - 03/23/17 1528      PT LONG TERM GOAL #1   Title  decrease TUG time to 20 seconds    Status  Achieved      PT LONG TERM GOAL #2   Title  decrease pain 25% with standing    Status  Achieved      PT LONG TERM GOAL #3   Title  independent and safe with TENS    Status  Achieved      PT LONG TERM GOAL #4   Title  report able to stand to cook a meal without difficulty    Status  Partially Met      PT LONG TERM GOAL #5   Title  walk 500 feet without rest and with a smooth gait/minimal deviation    Time  8    Period  Weeks    Status  New  Plan - 03/23/17 1523    Clinical Impression Statement  Patient has been having difficulty with her appointments as she does not drive and her family is working, she has it set up with a service that brings her through the insurance she has but it has been difficult for her to try to set it up herself.  She reports no falls, reports that she is now using a SPC that she bought.  She has aches and pains all over.  Her gait is with a very short and fast step with the right, she reports that the pain is in the right hip, seems like it would be the left as she is getting off of the left leg faster.    PT Next Visit Plan  would like to continue to work on stability, balance and function, as she reports that she has stopped some things at home due to fear of falling    Consulted and Agree with Plan of Care  Patient       Patient will benefit from skilled therapeutic intervention in order to improve the following deficits and impairments:  Abnormal gait, Cardiopulmonary status limiting activity, Decreased activity tolerance, Decreased balance, Decreased mobility, Decreased strength, Postural dysfunction, Improper body mechanics, Impaired flexibility, Pain, Difficulty walking, Decreased range of motion  Visit Diagnosis: Chronic bilateral low back pain  without sciatica - Plan: PT plan of care cert/re-cert  Difficulty in walking, not elsewhere classified - Plan: PT plan of care cert/re-cert     Problem List Patient Active Problem List   Diagnosis Date Noted  . Urinary incontinence in female 11/30/2016  . Pelvic relaxation 11/30/2016  . Dysuria 11/30/2016  . Vaginal atrophy 11/30/2016    Sumner Boast., PT 03/23/2017, 3:30 PM  [place] [address] [place], [place], [address] Phone: [phone]   Fax:  [phone]  Name: Kayla Lloyd MRN: [id number] Date of Birth: 01/07/1924

## 2017-03-31 ENCOUNTER — Encounter: Payer: 59 | Admitting: Physical Therapy

## 2017-04-01 ENCOUNTER — Ambulatory Visit: Payer: 59 | Admitting: Physical Therapy

## 2017-04-06 ENCOUNTER — Encounter: Payer: Self-pay | Admitting: Physical Therapy

## 2017-04-06 ENCOUNTER — Ambulatory Visit: Payer: 59 | Attending: Internal Medicine | Admitting: Physical Therapy

## 2017-04-06 DIAGNOSIS — R262 Difficulty in walking, not elsewhere classified: Secondary | ICD-10-CM | POA: Diagnosis present

## 2017-04-06 DIAGNOSIS — M545 Low back pain, unspecified: Secondary | ICD-10-CM

## 2017-04-06 DIAGNOSIS — G8929 Other chronic pain: Secondary | ICD-10-CM | POA: Diagnosis present

## 2017-04-06 NOTE — Therapy (Signed)
[place], [place], [address] Phone: [phone]   Fax:  [phone]  Physical Therapy Treatment  Patient Details  Name: Kayla Lloyd MRN: [id number] Date of Birth: July 22, 1923 Referring Provider: Velna Hatchet   Encounter Date: 04/06/2017  PT End of Session - 04/06/17 1450    Visit Number  10    Date for PT Re-Evaluation  04/22/17    PT Start Time  1400    PT Stop Time  1449    PT Time Calculation (min)  49 min    Activity Tolerance  Patient tolerated treatment well    Behavior During Therapy  [facility] for tasks assessed/performed       Past Medical History:  Diagnosis Date  . Cancer of septum of nose ([place])   . Hypertension   . Hypothyroidism     Past Surgical History:  Procedure Laterality Date  . hysterecrtomy    . left hip surgery    . ROTATOR CUFF REPAIR Right   . SMALL INTESTINE SURGERY      There were no vitals filed for this visit.  Subjective Assessment - 04/06/17 1352    Subjective  Patient reports that today is the first time she drove in about 9 months, she drove herself here today.  She reports that over the weekend she had to go to [place] and really was worn out for a few day.     Currently in Pain?  Yes    Pain Score  3     Pain Location  Back left shoulder    Aggravating Factors   activity    Pain Relieving Factors  rest         [facility] PT Assessment - 04/06/17 0001      Strength   Overall Strength Comments  better ability to activate abs with the ball squeeze                  [facility] Adult PT Treatment/Exercise - 04/06/17 0001      Ambulation/Gait   Gait Comments  Gait with SPC x 80 feet some cues for step length, gait with FWW 180 feet with some cues for speed      Lumbar Exercises: Aerobic   Nustep  L4 x 6 min      Lumbar Exercises: Standing   Other Standing Lumbar Exercises  using walker 2# marches and hip abduction      Lumbar  Exercises: Seated   Long Arc Quad on Chair  2 sets;10 reps;Weights;Both    LAQ on Chair Weights (lbs)  3    Sit to Stand  10 reps    Other Seated Lumbar Exercises  Adduction ball squeeze 2x10 reps;    Other Seated Lumbar Exercises  physioball in lap working on isometric abs               PT Short Term Goals - 02/07/17 1521      PT SHORT TERM GOAL #1   Title  independent with initial HEP    Status  Achieved        PT Long Term Goals - 04/06/17 1453      PT LONG TERM GOAL #4   Title  report able to stand to cook a meal without difficulty    Status  Partially Met      PT LONG TERM GOAL #5   Title  walk 500 feet without rest and with a  smooth gait/minimal deviation    Status  On-going            Plan - 04/06/17 1451    Clinical Impression Statement  Patient drove herself here today.  She walked very well with the FWW.  She still needs cues for walking with the [facility].  Her abdominals are getting stronger.  She continues to have pain with some exercises but is able to complete.  Has shoulder, leg, hip and back pain with some activities    PT Next Visit Plan  would like to continue to work on stability, balance and function, as she reports that she has stopped some things at home due to fear of falling    Consulted and Agree with Plan of Care  Patient       Patient will benefit from skilled therapeutic intervention in order to improve the following deficits and impairments:  Abnormal gait, Cardiopulmonary status limiting activity, Decreased activity tolerance, Decreased balance, Decreased mobility, Decreased strength, Postural dysfunction, Improper body mechanics, Impaired flexibility, Pain, Difficulty walking, Decreased range of motion  Visit Diagnosis: Chronic bilateral low back pain without sciatica  Difficulty in walking, not elsewhere classified     Problem List Patient Active Problem List   Diagnosis Date Noted  . Urinary incontinence in female 11/30/2016   . Pelvic relaxation 11/30/2016  . Dysuria 11/30/2016  . Vaginal atrophy 11/30/2016    Sumner Boast., PT 04/06/2017, 2:54 PM  [place] [address] [place], [place], [address] Phone: [phone]   Fax:  [phone]  Name: Kayla Lloyd MRN: [id number] Date of Birth: 01/21/1924

## 2017-04-11 ENCOUNTER — Ambulatory Visit: Payer: 59

## 2017-04-14 ENCOUNTER — Ambulatory Visit: Payer: 59 | Admitting: Physical Therapy

## 2017-04-20 ENCOUNTER — Telehealth: Payer: Self-pay

## 2017-04-20 NOTE — Telephone Encounter (Signed)
S/w pt and she stated that she is having urinary pain and incontinence again, and would like to refill antibiotic. With provider approval, s/w pharmacy to refill med and pharmacy stated that they would contact pt when rx is ready.

## 2017-04-27 ENCOUNTER — Telehealth: Payer: Self-pay | Admitting: *Deleted

## 2017-04-27 NOTE — Telephone Encounter (Signed)
Pt request appt.

## 2017-05-17 ENCOUNTER — Ambulatory Visit: Payer: 59 | Admitting: Podiatry

## 2017-05-24 ENCOUNTER — Ambulatory Visit (INDEPENDENT_AMBULATORY_CARE_PROVIDER_SITE_OTHER): Payer: 59

## 2017-05-24 ENCOUNTER — Ambulatory Visit (INDEPENDENT_AMBULATORY_CARE_PROVIDER_SITE_OTHER): Payer: 59 | Admitting: Podiatry

## 2017-05-24 ENCOUNTER — Encounter: Payer: Self-pay | Admitting: Podiatry

## 2017-05-24 DIAGNOSIS — M79671 Pain in right foot: Secondary | ICD-10-CM

## 2017-05-24 DIAGNOSIS — M722 Plantar fascial fibromatosis: Secondary | ICD-10-CM

## 2017-05-24 DIAGNOSIS — M79675 Pain in left toe(s): Secondary | ICD-10-CM | POA: Diagnosis not present

## 2017-05-24 MED ORDER — DICLOFENAC SODIUM 1 % TD GEL: 2.0000 g | Freq: Four times a day (QID) | TRANSDERMAL | 2 refills | Status: DC

## 2017-05-24 NOTE — Progress Notes (Signed)
Subjective:    Patient ID: Kayla Lloyd, female    DOB: 1923/10/28, 84 y.o.   MRN: [id number]  HPI 84 year old female presents the office today for concerns of right foot pain and she can feel a stretch to her foot at times.  She denies any recent injury or trauma to the area.  She states about 5 to 6 years ago she was seen other doctors for her right ankle.  She says it hurts on the inside aspect of her foot.  She said no recent treatment otherwise.  She has no other concerns.   Review of Systems  All other systems reviewed and are negative.  Past Medical History:  Diagnosis Date  . Cancer of septum of nose ([place])   . Hypertension   . Hypothyroidism     Past Surgical History:  Procedure Laterality Date  . hysterecrtomy    . left hip surgery    . ROTATOR CUFF REPAIR Right   . SMALL INTESTINE SURGERY       Current Outpatient Medications:  .  ALPRAZolam (XANAX) 0.25 MG tablet, TAKE 1 TABLET BY MOUTH 3 TIMES DAILY AS NEEDED FOR ANXIETY, Disp: , Rfl:  .  bisacodyl (DULCOLAX) 5 MG EC tablet, Take by mouth., Disp: , Rfl:  .  cetirizine (ZYRTEC) 10 MG tablet, TAKE 1 TABLET BY MOUTH EVERY DAY, Disp: , Rfl:  .  Cholecalciferol (VITAMIN D3) 1000 units CAPS, Take by mouth., Disp: , Rfl:  .  conjugated estrogens (PREMARIN) vaginal cream, Place 1 Applicatorful daily vaginally. Place 1/2 applicator vaginally twice a week, Disp: 42.5 g, Rfl: 12 .  Cyanocobalamin 100 MCG LOZG, Take by mouth., Disp: , Rfl:  .  denosumab (PROLIA) 60 MG/ML SOLN injection, Inject into the skin., Disp: , Rfl:  .  diclofenac sodium (VOLTAREN) 1 % GEL, Apply 2 g topically 4 (four) times daily. Rub into affected area of foot 2 to 4 times daily, Disp: 100 g, Rfl: 2 .  docusate sodium (DULCOLAX) 100 MG capsule, Take by mouth., Disp: , Rfl:  .  esomeprazole (NEXIUM) 40 MG capsule, Take by mouth., Disp: , Rfl:  .  levothyroxine (SYNTHROID, LEVOTHROID) 75 MCG tablet, Take 75 mcg daily before breakfast by mouth., Disp:  , Rfl:  .  simvastatin (ZOCOR) 40 MG tablet, Take 40 mg daily by mouth., Disp: , Rfl:  .  sulfamethoxazole-trimethoprim (BACTRIM DS,SEPTRA DS) 800-160 MG tablet, Take 1 tablet by mouth 2 (two) times daily., Disp: 14 tablet, Rfl: 1 .  valsartan-hydrochlorothiazide (DIOVAN-HCT) 80-12.5 MG tablet, Take 1 tablet daily by mouth., Disp: , Rfl:   Allergies  Allergen Reactions  . Brimonidine   . Codeine Other (See Comments)  . Levofloxacin Other (See Comments)  . Lidocaine   . Meloxicam   . Oxycodone   . Pregabalin   . Sulfa Antibiotics Other (See Comments)  . Timolol Maleate   . Zolpidem     Social History   Socioeconomic History  . Marital status: Widowed    Spouse name: Not on file  . Number of children: Not on file  . Years of education: Not on file  . Highest education level: Not on file  Occupational History  . Not on file  Social Needs  . Financial resource strain: Not on file  . Food insecurity:    Worry: Not on file    Inability: Not on file  . Transportation needs:    Medical: Not on file    Non-medical: Not on file  Tobacco  Use  . Smoking status: Never Smoker  . Smokeless tobacco: Never Used  Substance and Sexual Activity  . Alcohol use: No    Frequency: Never    Comment: wine/rare  . Drug use: No  . Sexual activity: Not Currently  Lifestyle  . Physical activity:    Days per week: Not on file    Minutes per session: Not on file  . Stress: Not on file  Relationships  . Social connections:    Talks on phone: Not on file    Gets together: Not on file    Attends religious service: Not on file    Active member of club or organization: Not on file    Attends meetings of clubs or organizations: Not on file    Relationship status: Not on file  . Intimate partner violence:    Fear of current or ex partner: Not on file    Emotionally abused: Not on file    Physically abused: Not on file    Forced sexual activity: Not on file  Other Topics Concern  . Not on  file  Social History Narrative  . Not on file        Objective:   Physical Exam  General: AAO x3, NAD  Dermatological: Skin is warm, dry and supple bilateral. Nails x 10 are well manicured; remaining integument appears unremarkable at this time. There are no open sores, no preulcerative lesions, no rash or signs of infection present.  Vascular: Dorsalis Pedis artery and Posterior Tibial artery pedal pulses are palpable bilateral with immedate capillary fill time.. There is no pain with calf compression, swelling, warmth, erythema.   Neruologic: Grossly intact via light touch bilateral.   Musculoskeletal: There is tenderness palpation on the plantar medial tubercle of the calcaneus and insertion of the right foot.  there is no course of the foot.  No other areas of tenderness.  There is no significant edema, erythema, increase in warmth.  There is no pain on the Achilles tendon noted.  No pain with lateral compression of calcaneus.  muscular strength 5/5 in all groups tested bilateral.     Assessment & Plan:  84 year old female with right foot pain, likely plantar fasciitis -Treatment options discussed including all alternatives, risks, and complications -Etiology of symptoms were discussed -X-rays were obtained and reviewed with the patient. There is no evidence of acute fracture or stress fracture identified today.   -Prescribed Voltaren gel -Discussed stretching, icing exercises daily -Discussed shoe modifications and inserts. -Follow-up in 6 weeks or sooner if needed.  Call any questions or concerns.  Trula Slade DPM

## 2017-05-25 ENCOUNTER — Telehealth: Payer: Self-pay

## 2017-05-25 NOTE — Telephone Encounter (Signed)
TC to pt regarding message. Pt states no relief w/ UTI rx refill Pt needs eval I have tried to contact the pt twice Sent to scheduling to make appt.

## 2017-05-30 ENCOUNTER — Telehealth: Payer: Self-pay

## 2017-05-30 ENCOUNTER — Other Ambulatory Visit: Payer: Self-pay | Admitting: Obstetrics and Gynecology

## 2017-05-30 MED ORDER — PHENAZOPYRIDINE HCL 200 MG PO TABS: 200.0000 mg | ORAL_TABLET | Freq: Three times a day (TID) | ORAL | 1 refills | Status: DC | PRN

## 2017-05-30 NOTE — Telephone Encounter (Signed)
Contacted pt and advised that provider sent rx for urinary pain and also advised pt that she needs to schedule follow up appt, pt agreed and stated that she would call back.

## 2017-06-02 ENCOUNTER — Ambulatory Visit: Payer: 59 | Admitting: Podiatry

## 2017-06-07 ENCOUNTER — Telehealth: Payer: Self-pay | Admitting: Podiatry

## 2017-06-07 ENCOUNTER — Encounter: Payer: Self-pay | Admitting: Obstetrics & Gynecology

## 2017-06-07 ENCOUNTER — Ambulatory Visit (INDEPENDENT_AMBULATORY_CARE_PROVIDER_SITE_OTHER): Payer: 59 | Admitting: Obstetrics & Gynecology

## 2017-06-07 VITALS — BP 100/61 | HR 92 | Temp 97.8°F | Ht 59.0 in | Wt 130.8 lb

## 2017-06-07 DIAGNOSIS — N3946 Mixed incontinence: Secondary | ICD-10-CM | POA: Diagnosis not present

## 2017-06-07 DIAGNOSIS — N390 Urinary tract infection, site not specified: Secondary | ICD-10-CM

## 2017-06-07 LAB — POCT URINALYSIS DIPSTICK
Bilirubin, UA: NEGATIVE
Glucose, UA: NEGATIVE
NITRITE UA: POSITIVE
PH UA: 6 (ref 5.0–8.0)
Spec Grav, UA: 1.015 (ref 1.010–1.025)
UROBILINOGEN UA: 1 U/dL

## 2017-06-07 MED ORDER — CEFTRIAXONE SODIUM 250 MG IJ SOLR
250.0000 mg | Freq: Once | INTRAMUSCULAR | Status: AC
  Administered 2017-06-07: 250 mg via INTRAMUSCULAR

## 2017-06-07 MED ORDER — FLAVOXATE HCL 100 MG PO TABS: 100.0000 mg | ORAL_TABLET | Freq: Three times a day (TID) | ORAL | 1 refills | Status: DC | PRN

## 2017-06-07 NOTE — Progress Notes (Signed)
Administered Rocephin and pt tolerated well.. Administrations This Visit    cefTRIAXone (ROCEPHIN) injection 250 mg    Admin Date 06/07/2017 Action Given Dose 250 mg Route Intramuscular Administered By Hinton Lovely, RN

## 2017-06-07 NOTE — Progress Notes (Signed)
Pt is here with c/o vaginal pain and burning. Pt states this has been going on since the beginning of the year and the medications she has been prescribed thus far have not work. Pt has also tried over the counter Azo and states it is not working

## 2017-06-07 NOTE — Progress Notes (Signed)
   Subjective:    Patient ID: Kayla Lloyd, female    DOB: [birth year], 84 y.o.   MRN: [id number]  HPI 84 yo widowed P8 here today with recurrent bladder infections. This has been going on since since at least 12/18. She also has urinary incontinence for at least a year. She has been seen by Dr. Matilde Sprang but she did not enjoy their interaction.    Review of Systems     Objective:   Physical Exam Breathing, conversing, and ambulating normally with a cane Well nourished, well hydrated White female, no apparent distress No CVAT      Assessment & Plan:  UTI, recurrent- just finished the pyridium and bactrim, reports no help I have prescribed urispas, IM rocephin, sent a culture Refer to urogynecology

## 2017-06-07 NOTE — Telephone Encounter (Signed)
Patients granddaughter called and stated that [place] medication is not covered by insurance and She needs to know what to do next to get her grandmothers medication covered by [facility] insurance

## 2017-06-07 NOTE — Telephone Encounter (Signed)
I told pt's grddtr, Denton Ar have pharmacy fax a PA request for the diclofenac gel to our office.

## 2017-06-07 NOTE — Addendum Note (Signed)
Addended by: Emily Filbert on: 06/07/2017 03:28 PM   Modules accepted: Orders

## 2017-06-07 NOTE — Addendum Note (Signed)
Addended byCleotilde Neer on: 06/07/2017 03:21 PM   Modules accepted: Orders

## 2017-06-08 ENCOUNTER — Telehealth: Payer: Self-pay | Admitting: Podiatry

## 2017-06-08 NOTE — Telephone Encounter (Signed)
I'm calling on behalf of my grandmother. You called her earlier in the day and you really confused her a lot. I was wondering if you could call me back and tell me what you were telling her. If you can call me back at [phone]. Thank you. Have a nice day.

## 2017-06-08 NOTE — Telephone Encounter (Signed)
PA started for diclofenac gel 1%, with CoverMyMeds.

## 2017-06-08 NOTE — Telephone Encounter (Signed)
I informed pt's grddtr, Denton Ar, I had only spoken to her not the pt yesterday, and did not see anyone from our office had spoken with pt. I told her I did see in Epic, pt had been seen by Dr. Hulan Fray yesterday and possibly that office staff had called.

## 2017-06-08 NOTE — Telephone Encounter (Signed)
[employer] denied the PA for diclofenac gel, and sent appeal forms. I completed the forms with statement, that oral antiinflammatory medications may cause and increase in GI bleeds and other problems in pt's over 82 years old, and pt is 84 yo, and clinicals.

## 2017-06-09 ENCOUNTER — Telehealth: Payer: Self-pay | Admitting: Podiatry

## 2017-06-09 LAB — URINE CULTURE

## 2017-06-09 NOTE — Telephone Encounter (Signed)
This call is in regards to some clinical questions we have on a medicine appeal. The case number is [id number]. This is regarding the diclofenac 1% gel. We faxed the clinical questions to [phone]. If you could fill that out entirely answering the questions and return to [place] by noon tomorrow, Friday, 10 Jun 2017. Any questions, you can contact us at [phone]. Thank you and have a great day.

## 2017-06-09 NOTE — Telephone Encounter (Signed)
Faxed the required four sheet to [facility] with every last other form and clinical that had been faxed to the other PA sites.

## 2017-06-10 ENCOUNTER — Other Ambulatory Visit: Payer: Self-pay

## 2017-06-10 ENCOUNTER — Other Ambulatory Visit: Payer: Self-pay | Admitting: Obstetrics & Gynecology

## 2017-06-13 ENCOUNTER — Telehealth: Payer: Self-pay

## 2017-06-13 ENCOUNTER — Other Ambulatory Visit: Payer: Self-pay | Admitting: Obstetrics & Gynecology

## 2017-06-13 ENCOUNTER — Telehealth: Payer: Self-pay | Admitting: Podiatry

## 2017-06-13 MED ORDER — TETRACYCLINE HCL 500 MG PO CAPS: 500.0000 mg | ORAL_CAPSULE | Freq: Four times a day (QID) | ORAL | 0 refills | Status: DC

## 2017-06-13 MED ORDER — NONFORMULARY OR COMPOUNDED ITEM: 2 refills | Status: DC

## 2017-06-13 NOTE — Progress Notes (Signed)
Tetracycline called in

## 2017-06-13 NOTE — Addendum Note (Signed)
Addended by: Harriett Sine D on: 06/13/2017 01:47 PM   Modules accepted: Orders

## 2017-06-13 NOTE — Telephone Encounter (Signed)
I informed pt's grddtr, Kayla Lloyd pt's insurance would not cover the Diclofenac gel, Dr. Jacqualyn Posey had ordered the a compound from [facility], and they would call her with the insurance coverage and delivery information. Kayla Lloyd states understanding.

## 2017-06-13 NOTE — Telephone Encounter (Signed)
[employer] DENIED COVERAGE OF VOLTAREN GEL 1%.

## 2017-06-13 NOTE — Telephone Encounter (Signed)
Faxed rx to [facility]. 

## 2017-06-13 NOTE — Telephone Encounter (Signed)
TC from patient who let me know how pleased she was with seeing Dr.Dove pt continued to speak highly of her visit with Dr.Dove  she has finished her first bottle of (10 tabs ) Urispas.  She has began to notice relief finally  Pt wants to know should she continue second refill Rx.

## 2017-06-13 NOTE — Telephone Encounter (Signed)
This is [name] calling on behalf of Kayla Lloyd. I'm calling to see if the prior authorization for her foot cream was sent to her insurance company? Please call me back at [phone]. Thank you.

## 2017-06-15 ENCOUNTER — Other Ambulatory Visit: Payer: Self-pay | Admitting: Obstetrics & Gynecology

## 2017-06-15 ENCOUNTER — Telehealth: Payer: Self-pay

## 2017-06-15 NOTE — Telephone Encounter (Signed)
Patient already got the Urispas medication, she is on her second bottle.

## 2017-06-15 NOTE — Telephone Encounter (Signed)
-----   Message from Gretchen Short, [place] sent at 06/15/2017  8:08 AM EDT ----- Regarding: Kayla Lloyd I received a call from [facility] stating Kayla Lloyd was not a formulary medication and the pharmacy has faxed over alternate medications. Please see if fax came over. If not, the pharamacy will need to be called to follow up.   Thanks [employer]

## 2017-06-22 ENCOUNTER — Other Ambulatory Visit: Payer: Self-pay

## 2017-06-22 MED ORDER — DOXYCYCLINE HYCLATE 100 MG PO CAPS: 100.0000 mg | ORAL_CAPSULE | Freq: Two times a day (BID) | ORAL | 0 refills | Status: DC

## 2017-06-22 NOTE — Progress Notes (Signed)
I received a call from "Juliann Pulse" at [facility] asking to switch from Tetracycline to Doxycycline. She said insurance will not cover Tetracycline. Consulted with Dr. Hulan Fray. Change approved.

## 2017-07-05 ENCOUNTER — Ambulatory Visit (INDEPENDENT_AMBULATORY_CARE_PROVIDER_SITE_OTHER): Payer: 59 | Admitting: Podiatry

## 2017-07-05 DIAGNOSIS — M722 Plantar fascial fibromatosis: Secondary | ICD-10-CM | POA: Diagnosis not present

## 2017-07-06 ENCOUNTER — Telehealth: Payer: Self-pay | Admitting: *Deleted

## 2017-07-06 NOTE — Telephone Encounter (Signed)
Heather - [facility] states without insurance self-pay PT is $180.00-200.00 without skilled nursing task, but with pt's insurance Medicare, [employer] and medicaid it should be covered. Faxed orders to [facility].

## 2017-07-06 NOTE — Telephone Encounter (Signed)
There really isn't a wound or anything. There is no way to just get home PT?

## 2017-07-06 NOTE — Telephone Encounter (Signed)
-----   Message from Trula Slade, DPM sent at 07/05/2017  5:14 PM EDT ----- Can we try for home PT for plantar fasciitis, history of falls? Thanks.

## 2017-07-06 NOTE — Telephone Encounter (Signed)
Left message requesting a call back from [facility] for cost information for PT for plantar fasciitis and gait and balance training as personal pay.

## 2017-07-06 NOTE — Telephone Encounter (Signed)
She won't do self pay most likely from what they were telling me and going to formal PT is too much for her. Just thought it would be good to try to get someone to come out to help. Thanks for checking.

## 2017-07-07 NOTE — Progress Notes (Signed)
Subjective: 84 year old female presents the office for follow-up evaluation of right foot pain.  She states her pain has resolved he has been using a compound cream.  She has some occasional pain at nighttime to her heels but overall she is doing much better.  She denies any swelling or redness.  She does have a history of falls x1 but this is not been recent.  She is asking about shoes again today.  She has no other concerns. Denies any systemic complaints such as fevers, chills, nausea, vomiting. No acute changes since last appointment, and no other complaints at this time.   Objective: AAO x3, NAD DP/PT pulses palpable bilaterally, CRT less than 3 seconds At this time there is no area of tenderness identified to bilateral lower extremities.  There is no overlying edema, erythema, increased warmth.  There is no pain on the course or insertion of plantar fascia.  Achilles tendon appears to be intact.  Peroneals are intact.  No other areas of tenderness.  No edema to the foot. No open lesions or pre-ulcerative lesions.  No pain with calf compression, swelling, warmth, erythema  Assessment: Resolved bilateral foot pain  Plan: -All treatment options discussed with the patient including all alternatives, risks, complications.  -Continue with general stretching exercises for her feet.  We will try get home physical therapy for her to help with this and also given her history of fall this would be beneficial for her.  She is also not shoes today and she cannot find any shoes at the store to fit her feet as she does get some swelling to her legs which is been a chronic issue.  I have Liliane Channel evaluate her today to be measured for potential shoes. -She would like to be scheduled for routine care.  I will see her back at that time for routine care sooner if any issues are to arise. -Patient encouraged to call the office with any questions, concerns, change in symptoms.   Trula Slade DPM

## 2017-07-11 ENCOUNTER — Telehealth: Payer: Self-pay | Admitting: Podiatry

## 2017-07-11 DIAGNOSIS — R1313 Dysphagia, pharyngeal phase: Secondary | ICD-10-CM | POA: Insufficient documentation

## 2017-07-11 NOTE — Telephone Encounter (Signed)
This is [name] with [facility]. I'm calling in regards to the referral we received on Kayla Lloyd. I wanted to let Dr. Jacqualyn Posey know that we would be able to go out and see the pt tomorrow, Tuesday 18 June if that is okay with him. If someone would please give me a call back so I can document who did receive this message at [phone]. Thank you.

## 2017-07-11 NOTE — Telephone Encounter (Signed)
Left message with Lajuana Matte - [facility] that it would be okay to being pt care tomorrow.

## 2017-07-13 ENCOUNTER — Telehealth: Payer: Self-pay | Admitting: Podiatry

## 2017-07-13 DIAGNOSIS — Z9181 History of falling: Secondary | ICD-10-CM | POA: Diagnosis not present

## 2017-07-13 DIAGNOSIS — M722 Plantar fascial fibromatosis: Secondary | ICD-10-CM | POA: Diagnosis not present

## 2017-07-13 NOTE — Telephone Encounter (Signed)
I informed pt Dr. Jacqualyn Posey would be glad for her to have the PT as recommended. Jackelyn Poling states also pt has some significant leg length difference and is going to make an appt with Dr. Jacqualyn Posey.

## 2017-07-13 NOTE — Telephone Encounter (Signed)
This is Debbie, Community education officer with [facility]. I have a referral from Dr. Jacqualyn Posey on Ms. Mcclane. She was referred to [place] for plantar fasciitis and some balance issues. I saw her today and I just needed to clarify/confirm verbal orders. For physical therapy to continue with therapy for the plantar fasciitis and a balance program. I wanted to go twice a week for nine weeks which would be 18 visits. If you could get that confirmed and let me know. My number is [phone] this is my cell number so a verbal order could be left. Thank you.

## 2017-07-15 ENCOUNTER — Other Ambulatory Visit: Payer: Self-pay | Admitting: Otolaryngology

## 2017-07-15 DIAGNOSIS — R1310 Dysphagia, unspecified: Secondary | ICD-10-CM

## 2017-07-22 ENCOUNTER — Ambulatory Visit
Admission: RE | Admit: 2017-07-22 | Discharge: 2017-07-22 | Disposition: A | Discharge status: Home / Self Care | Payer: 59 | Source: Ambulatory Visit | Attending: Otolaryngology | Admitting: Otolaryngology

## 2017-07-22 DIAGNOSIS — R1310 Dysphagia, unspecified: Secondary | ICD-10-CM

## 2017-08-11 ENCOUNTER — Telehealth: Payer: Self-pay | Admitting: Podiatry

## 2017-08-11 DIAGNOSIS — M722 Plantar fascial fibromatosis: Secondary | ICD-10-CM

## 2017-08-11 DIAGNOSIS — R2681 Unsteadiness on feet: Secondary | ICD-10-CM

## 2017-08-11 NOTE — Telephone Encounter (Signed)
Faxed orders for walker with seat to [facility] and emailed to A. Barnet [place].

## 2017-08-11 NOTE — Addendum Note (Signed)
Addended by: Harriett Sine D on: 08/11/2017 02:39 PM   Modules accepted: Orders

## 2017-08-11 NOTE — Telephone Encounter (Signed)
I spoke with pt and informed her I would order the walker requested through [facility]. A female in the background stated the physical therapist stated it could be ordered from [facility] and delivered. I told pt, [facility] is who we send orders for durable medical equipment like walkers and they would contact her with pick up information and insurance coverage. Pt states understanding.

## 2017-08-11 NOTE — Telephone Encounter (Signed)
In need of a referral to be seen at [facility] for a walker that has a seat on it. Please call patient back at [phone]

## 2017-08-15 ENCOUNTER — Encounter: Payer: Self-pay | Admitting: Gastroenterology

## 2017-08-16 ENCOUNTER — Ambulatory Visit (INDEPENDENT_AMBULATORY_CARE_PROVIDER_SITE_OTHER): Payer: 59 | Admitting: Podiatry

## 2017-08-16 ENCOUNTER — Encounter: Payer: Self-pay | Admitting: Podiatry

## 2017-08-16 DIAGNOSIS — B351 Tinea unguium: Secondary | ICD-10-CM

## 2017-08-16 DIAGNOSIS — M79675 Pain in left toe(s): Secondary | ICD-10-CM | POA: Diagnosis not present

## 2017-08-16 DIAGNOSIS — M79674 Pain in right toe(s): Secondary | ICD-10-CM

## 2017-08-17 NOTE — Progress Notes (Signed)
Subjective: 84 y.o. returns the office today for painful, elongated, thickened toenails which she cannot trim herself. Denies any redness or drainage around the nails. She is still doing PT and has been helpful. She just got her scooter today. She states the shoes are fitting well and not causing any issues. She still has the compound cream and is helpful but it is too expensive to keep getting. Denies any acute changes since last appointment and no new complaints today. Denies any systemic complaints such as fevers, chills, nausea, vomiting.   PCP: Elenor Quinones C, DO  Objective: AAO 3, NAD DP/PT pulses palpable, CRT less than 3 seconds Nails hypertrophic, dystrophic, elongated, brittle, discolored 10. There is tenderness overlying the nails 1-5 bilaterally. There is no surrounding erythema or drainage along the nail sites. Erythema from irritation on the dorsal 2nd PIPJ without any skin breakdown. Hammertoe contracture is present.  No open lesions or pre-ulcerative lesions are identified. No other areas of tenderness bilateral lower extremities. No overlying edema, erythema, increased warmth. No pain with calf compression, swelling, warmth, erythema.  Assessment: Ms. Kundert presents with symptomatic onychomycosis  Plan: -Treatment options including alternatives, risks, complications were discussed -Nails sharply debrided 10 without complication/bleeding. -Continue PT as this has been helpful. She got her scooter today. She does have the compound cream which helps but due to cost we discussed Aspercreme.  -Discussed daily foot inspection. If there are any changes, to call the office immediately.  -Follow-up in 3 months or sooner if any problems are to arise. In the meantime, encouraged to call the office with any questions, concerns, changes symptoms.  Celesta Gentile, DPM

## 2017-09-13 DIAGNOSIS — M2550 Pain in unspecified joint: Secondary | ICD-10-CM | POA: Diagnosis not present

## 2017-09-13 DIAGNOSIS — M7050 Other bursitis of knee, unspecified knee: Secondary | ICD-10-CM | POA: Diagnosis not present

## 2017-09-13 DIAGNOSIS — M810 Age-related osteoporosis without current pathological fracture: Secondary | ICD-10-CM | POA: Diagnosis not present

## 2017-09-13 DIAGNOSIS — M1990 Unspecified osteoarthritis, unspecified site: Secondary | ICD-10-CM | POA: Diagnosis not present

## 2017-09-13 DIAGNOSIS — M25551 Pain in right hip: Secondary | ICD-10-CM | POA: Diagnosis not present

## 2017-09-13 DIAGNOSIS — M25562 Pain in left knee: Secondary | ICD-10-CM | POA: Diagnosis not present

## 2017-09-13 DIAGNOSIS — M7061 Trochanteric bursitis, right hip: Secondary | ICD-10-CM | POA: Diagnosis not present

## 2017-09-20 DIAGNOSIS — M810 Age-related osteoporosis without current pathological fracture: Secondary | ICD-10-CM | POA: Diagnosis not present

## 2017-09-21 DIAGNOSIS — N329 Bladder disorder, unspecified: Secondary | ICD-10-CM | POA: Diagnosis not present

## 2017-09-21 DIAGNOSIS — N390 Urinary tract infection, site not specified: Secondary | ICD-10-CM | POA: Diagnosis not present

## 2017-09-21 DIAGNOSIS — Z87440 Personal history of urinary (tract) infections: Secondary | ICD-10-CM | POA: Diagnosis not present

## 2017-09-21 DIAGNOSIS — N3281 Overactive bladder: Secondary | ICD-10-CM | POA: Insufficient documentation

## 2017-09-23 DIAGNOSIS — M19212 Secondary osteoarthritis, left shoulder: Secondary | ICD-10-CM | POA: Diagnosis not present

## 2017-09-27 DIAGNOSIS — H02055 Trichiasis without entropian left lower eyelid: Secondary | ICD-10-CM | POA: Diagnosis not present

## 2017-09-27 DIAGNOSIS — H02052 Trichiasis without entropian right lower eyelid: Secondary | ICD-10-CM | POA: Diagnosis not present

## 2017-09-30 DIAGNOSIS — R399 Unspecified symptoms and signs involving the genitourinary system: Secondary | ICD-10-CM | POA: Diagnosis not present

## 2017-10-12 DIAGNOSIS — L259 Unspecified contact dermatitis, unspecified cause: Secondary | ICD-10-CM | POA: Diagnosis not present

## 2017-10-12 DIAGNOSIS — L570 Actinic keratosis: Secondary | ICD-10-CM | POA: Diagnosis not present

## 2017-10-12 DIAGNOSIS — L821 Other seborrheic keratosis: Secondary | ICD-10-CM | POA: Diagnosis not present

## 2017-10-13 ENCOUNTER — Other Ambulatory Visit (INDEPENDENT_AMBULATORY_CARE_PROVIDER_SITE_OTHER): Payer: Medicare HMO

## 2017-10-13 ENCOUNTER — Encounter: Payer: Self-pay | Admitting: Gastroenterology

## 2017-10-13 ENCOUNTER — Ambulatory Visit (INDEPENDENT_AMBULATORY_CARE_PROVIDER_SITE_OTHER): Payer: Medicare HMO | Admitting: Gastroenterology

## 2017-10-13 VITALS — BP 124/64 | HR 72 | Ht 59.0 in | Wt 132.1 lb

## 2017-10-13 DIAGNOSIS — R933 Abnormal findings on diagnostic imaging of other parts of digestive tract: Secondary | ICD-10-CM | POA: Diagnosis not present

## 2017-10-13 DIAGNOSIS — K219 Gastro-esophageal reflux disease without esophagitis: Secondary | ICD-10-CM

## 2017-10-13 DIAGNOSIS — R1310 Dysphagia, unspecified: Secondary | ICD-10-CM | POA: Diagnosis not present

## 2017-10-13 DIAGNOSIS — K649 Unspecified hemorrhoids: Secondary | ICD-10-CM | POA: Diagnosis not present

## 2017-10-13 DIAGNOSIS — R1319 Other dysphagia: Secondary | ICD-10-CM

## 2017-10-13 DIAGNOSIS — K625 Hemorrhage of anus and rectum: Secondary | ICD-10-CM | POA: Diagnosis not present

## 2017-10-13 LAB — CBC
HCT: 39 % (ref 36.0–46.0)
Hemoglobin: 13 g/dL (ref 12.0–15.0)
MCHC: 33.4 g/dL (ref 30.0–36.0)
MCV: 94.8 fl (ref 78.0–100.0)
PLATELETS: 148 10*3/uL — AB (ref 150.0–400.0)
RBC: 4.12 Mil/uL (ref 3.87–5.11)
RDW: 14.4 % (ref 11.5–15.5)
WBC: 6.1 10*3/uL (ref 4.0–10.5)

## 2017-10-13 LAB — PROTIME-INR
INR: 1 ratio (ref 0.8–1.0)
PROTHROMBIN TIME: 12.1 s (ref 9.6–13.1)

## 2017-10-13 LAB — FERRITIN: FERRITIN: 38.3 ng/mL (ref 10.0–291.0)

## 2017-10-13 NOTE — Patient Instructions (Signed)
Normal BMI (Body Mass Index- based on height and weight) is between 23 and 30. Your BMI today is Body mass index is 26.69 kg/m. Marland Kitchen Please consider follow up  regarding your BMI with your Primary Care Provider.  Your provider has requested that you go to the basement level for lab work before leaving today. Press "B" on the elevator. The lab is located at the first door on the left as you exit the elevator.  We will contact you in the next few weeks to schedule a hospital endoscopy/dilation  for November  Thank you for entrusting me with your care and choosing [facility].  Dr Rush Landmark

## 2017-10-13 NOTE — Progress Notes (Signed)
[place] VISIT   Primary Care Provider Velna Hatchet, [place] [address] [phone]  Referring Provider Vernell Leep, [place] [address] [phone]  Patient Profile: Kayla Lloyd is a 84 y.o. female with a pmh significant for hypertension, hypothyroidism, prior bowel obstruction (no resection many years ago), melanoma of the nose, status post cholecystectomy, status post hysterectomy, sleep apnea.  The patient presents to the [facility] for an evaluation and management of problem(s) noted below:  Problem List 1. Abnormal barium swallow   2. Esophageal dysphagia   3. Gastroesophageal reflux disease, esophagitis presence not specified     History of Present Illness: This is the patient's first visit to the [facility] [place] [facility].  She describes multiple GI issues over the course of her life however, her biggest issue at today's visit includes her dysphasia, GERD, infrequent abdominal pain, and bloating.  She has dealt with issues of constipation and hemorrhoids/hemorrhoidal bleeding in the past.  She describes being given a diagnosis of irritable bowel syndrome years ago.  She describes a long-standing issue of dysphasia to solid foods as well as pills that occurs approximately every 2 weeks such that she has to regurgitate her food and that frequent of an occasion.  She does not have any issues to liquids.  Weight has overall been stable over the course the last few years.  Describes no significant issues of coffee-ground emesis or hematemesis.  She has pyrosis which is relatively controlled on her current dose of PPI (Nexium 40 mg daily).  She does not often have breakthrough.  In June 2019, she had a barium swallow performed for issues of dysphasia and although no significant stricture was noted with a 13 mm barium tablet did not pass.  She describes when she was in her early 4s  she had a bowel obstruction but did not undergo any resection.  She has bowel movements every other day.  She cannot recall if she has ever had an upper endoscopy performed.  She is previously had a colonoscopy she believes in [place] 18 to 20 years ago but does not recall the records or if she is previously had polyps.  GI Review of Systems Positive as above Negative for odynophagia, early satiety, melena, overt hematochezia, jaundice  Review of Systems General: Denies fevers/chills/weight loss HEENT: Denies oral lesions Cardiovascular: Denies chest pain Pulmonary: Denies shortness of breath Gastroenterological: See HPI Genitourinary: Denies darkened urine Hematological: Positive for easy bruising/bleeding Dermatological: Denies new skin changes Psychological: Always has some mild anxiety but is stable Musculoskeletal: Denies new significant arthralgias   Medications Current Outpatient Medications  Medication Sig Dispense Refill  . ALPRAZolam (XANAX) 0.25 MG tablet TAKE 1 TABLET BY MOUTH 3 TIMES DAILY AS NEEDED FOR ANXIETY    . bisacodyl (DULCOLAX) 5 MG EC tablet Take by mouth.    . cetirizine (ZYRTEC) 10 MG tablet TAKE 1 TABLET BY MOUTH EVERY DAY    . Cholecalciferol (VITAMIN D3) 1000 units CAPS Take by mouth.    . conjugated estrogens (PREMARIN) vaginal cream Place 1 Applicatorful daily vaginally. Place 1/2 applicator vaginally twice a week 42.5 g 12  . Cyanocobalamin 100 MCG LOZG Take by mouth.    . denosumab (PROLIA) 60 MG/ML SOLN injection Inject into the skin.    Marland Kitchen diclofenac sodium (VOLTAREN) 1 % GEL Apply 2 g topically 4 (four) times daily. Rub into affected area of foot 2 to 4 times daily  100 g 2  . docusate sodium (DULCOLAX) 100 MG capsule Take by mouth.    . doxycycline (VIBRAMYCIN) 100 MG capsule Take 1 capsule (100 mg total) by mouth 2 (two) times daily. 14 capsule 0  . esomeprazole (NEXIUM) 40 MG capsule Take by mouth.    . flavoxATE (URISPAS) 100 MG  tablet TAKE 1 TABLET BY MOUTH 3 TIMES DAILY AS NEEDED FOR BLADDER SPASMS 10 tablet 1  . levothyroxine (SYNTHROID, LEVOTHROID) 75 MCG tablet Take 75 mcg daily before breakfast by mouth.    . NONFORMULARY OR COMPOUNDED ITEM [facility]:  Diclofenac 3%, Baclofen 2%, NO LIDOCAINE, apply 1-2 grams to affected area 3-4 time daily. 120 each 2  . phenazopyridine (PYRIDIUM) 200 MG tablet Take 1 tablet (200 mg total) by mouth 3 (three) times daily as needed for pain (urethral spasm). 10 tablet 1  . simvastatin (ZOCOR) 40 MG tablet Take 40 mg daily by mouth.    . sulfamethoxazole-trimethoprim (BACTRIM DS,SEPTRA DS) 800-160 MG tablet Take 1 tablet by mouth 2 (two) times daily. 14 tablet 1  . tetracycline (ACHROMYCIN,SUMYCIN) 500 MG capsule Take 1 capsule (500 mg total) by mouth 4 (four) times daily. 28 capsule 0  . valsartan-hydrochlorothiazide (DIOVAN-HCT) 80-12.5 MG tablet Take 1 tablet daily by mouth.     No current facility-administered medications for this visit.     Allergies Allergies  Allergen Reactions  . Brimonidine   . Codeine Other (See Comments)  . Levofloxacin Other (See Comments)  . Lidocaine   . Meloxicam   . Oxycodone   . Pregabalin   . Sulfa Antibiotics Other (See Comments)  . Timolol Maleate   . Zolpidem     Histories Past Medical History:  Diagnosis Date  . Bowel obstruction ([place]) 1950  . Cancer of septum of nose (HCC)    melanoma  . Hypertension   . Hypothyroidism    Past Surgical History:  Procedure Laterality Date  . CHOLECYSTECTOMY    . hysterecrtomy    . left hip surgery    . ROTATOR CUFF REPAIR Right   . SMALL INTESTINE SURGERY     Social History   Socioeconomic History  . Marital status: Widowed    Spouse name: Not on file  . Number of children: 8  . Years of education: Not on file  . Highest education level: Not on file  Occupational History  . Not on file  Social Needs  . Financial resource strain: Not on file  . Food insecurity:     Worry: Not on file    Inability: Not on file  . Transportation needs:    Medical: Not on file    Non-medical: Not on file  Tobacco Use  . Smoking status: Never Smoker  . Smokeless tobacco: Never Used  Substance and Sexual Activity  . Alcohol use: No    Frequency: Never    Comment: wine/rare  . Drug use: No  . Sexual activity: Not Currently  Lifestyle  . Physical activity:    Days per week: Not on file    Minutes per session: Not on file  . Stress: Not on file  Relationships  . Social connections:    Talks on phone: Not on file    Gets together: Not on file    Attends religious service: Not on file    Active member of club or organization: Not on file    Attends meetings of clubs or organizations: Not on file    Relationship status: Not on  file  . Intimate partner violence:    Fear of current or ex partner: Not on file    Emotionally abused: Not on file    Physically abused: Not on file    Forced sexual activity: Not on file  Other Topics Concern  . Not on file  Social History Narrative  . Not on file   Family History  Problem Relation Age of Onset  . Uterine cancer Mother   . Hypertension Father   . Colon cancer Neg Hx   . Esophageal cancer Neg Hx   . Liver disease Neg Hx   . Inflammatory bowel disease Neg Hx   . Pancreatic cancer Neg Hx   . Rectal cancer Neg Hx   . Stomach cancer Neg Hx    I have reviewed her medical, social, and family history in detail and updated the electronic medical record as necessary.    PHYSICAL EXAMINATION  BP 124/64   Pulse 72   Ht 4\' 11"  (1.499 m)   Wt 132 lb 2 oz (59.9 kg)   BMI 26.69 kg/m  Wt Readings from Last 3 Encounters:  10/13/17 132 lb 2 oz (59.9 kg)  06/07/17 130 lb 12.8 oz (59.3 kg)  12/29/16 133 lb (60.3 kg)  GEN: NAD, appears younger than stated age, doesn't appear chronically ill, accompanied by granddaughter PSYCH: Cooperative, without pressured speech EYE: Conjunctivae pink, sclerae anicteric ENT: MMM,  without oral ulcers, no erythema or exudates noted NECK: Supple CV: RR without R/Gs  RESP: No adventitious sounds GI: NABS, soft, NT/ND, without rebound or guarding, no HSM appreciated, surgical scar appreciated in mid abdomen MSK/EXT: Very minimal trace bilateral lower extremity edema SKIN: No jaundice NEURO:  Alert & Oriented x 3, no focal deficits   REVIEW OF DATA  I reviewed the following data at the time of this encounter:  GI Procedures and Studies  No relevant studies to review  Laboratory Studies  Reviewed in epic  Imaging Studies  June 2019 barium swallow IMPRESSION: 1. Nonspecific esophageal motility disorder. 2. No hiatal hernia or GE reflux demonstrated. 3. No obvious mass or stricture. However, the 13 mm barium pill would not pass into the stomach and there must be some narrowing of the distal esophagus that is not well demonstrated.   ASSESSMENT  Ms. Albin is a 84 y.o. female with a pmh significant for hypertension, hypothyroidism, prior bowel obstruction (no resection many years ago), melanoma of the nose, status post cholecystectomy, status post hysterectomy, sleep apnea.  The patient is seen today for evaluation and management of:  1. Abnormal barium swallow   2. Esophageal dysphagia   3. Gastroesophageal reflux disease, esophagitis presence not specified    This is a patient who clinically has described issues of ongoing, long-standing, medically controlled GERD who describes issues of esophageal dysphasia.  She has underwent a barium study earlier this year that showed no overt stricture however 30 mm barium tablet did not pass.  Discussed with the patient and her granddaughter that an upper endoscopy is a reasonable next step to rule out stricturing disease as well as malignancy and other etiologies of esophageal dysphasia such as EOE.  We also briefly discussed that sometimes structurally can be no abnormalities but dysmotility is can be existent.  The risks  and benefits of endoscopic evaluation were discussed with the patient; these include but are not limited to the risk of perforation, infection, bleeding, missed lesions, lack of diagnosis, severe illness requiring hospitalization, as well as anesthesia and  sedation related illnesses.  The patient and granddaughter are agreeable to proceed.  I will obtain iron studies/iron indices to ensure that the patient will not need any other procedures if overt iron deficiency anemia is found.  All patient questions were answered, to the best of my ability, and the patient agrees to the aforementioned plan of action with follow-up as indicated.   PLAN  1. Esophageal dysphagia - Plan for EGD with possible Dilation in [place]; Future - CBC; Future - Ferritin; Future - Iron Binding Cap (TIBC); Future  2. Abnormal barium swallow  3. Gastroesophageal reflux disease, esophagitis presence not specified - Continue Nexium QD  Orders Placed This Encounter  Procedures  . Protime-INR  . CBC  . Ferritin  . Iron Binding Cap (TIBC)    New Prescriptions   No medications on file   Modified Medications   No medications on file    Planned Follow Up: No follow-ups on file.   Justice Britain, MD [facility] Office # [phone]

## 2017-10-14 LAB — IRON AND TIBC
Iron Saturation: 21 % (ref 15–55)
Iron: 61 ug/dL (ref 27–139)
TIBC: 284 ug/dL (ref 250–450)
UIBC: 223 ug/dL (ref 118–369)

## 2017-10-17 ENCOUNTER — Encounter: Payer: Self-pay | Admitting: Gastroenterology

## 2017-10-17 DIAGNOSIS — R933 Abnormal findings on diagnostic imaging of other parts of digestive tract: Secondary | ICD-10-CM | POA: Insufficient documentation

## 2017-10-17 DIAGNOSIS — K219 Gastro-esophageal reflux disease without esophagitis: Secondary | ICD-10-CM | POA: Insufficient documentation

## 2017-10-17 DIAGNOSIS — R1310 Dysphagia, unspecified: Secondary | ICD-10-CM | POA: Insufficient documentation

## 2017-10-28 ENCOUNTER — Telehealth: Payer: Self-pay

## 2017-10-28 NOTE — Telephone Encounter (Signed)
Returned call and pt stated that she would like an alternate rx for flavoxATE. Pt reports GI upset and still having bladder spasms. Advised that I would route to provider for review.

## 2017-11-07 DIAGNOSIS — M810 Age-related osteoporosis without current pathological fracture: Secondary | ICD-10-CM | POA: Diagnosis not present

## 2017-11-07 DIAGNOSIS — M2550 Pain in unspecified joint: Secondary | ICD-10-CM | POA: Diagnosis not present

## 2017-11-07 DIAGNOSIS — M7061 Trochanteric bursitis, right hip: Secondary | ICD-10-CM | POA: Diagnosis not present

## 2017-11-07 DIAGNOSIS — M1990 Unspecified osteoarthritis, unspecified site: Secondary | ICD-10-CM | POA: Diagnosis not present

## 2017-11-07 DIAGNOSIS — M25551 Pain in right hip: Secondary | ICD-10-CM | POA: Diagnosis not present

## 2017-11-10 ENCOUNTER — Other Ambulatory Visit: Payer: Self-pay | Admitting: Gastroenterology

## 2017-11-10 DIAGNOSIS — K219 Gastro-esophageal reflux disease without esophagitis: Secondary | ICD-10-CM

## 2017-11-10 DIAGNOSIS — R1310 Dysphagia, unspecified: Secondary | ICD-10-CM

## 2017-11-10 DIAGNOSIS — R933 Abnormal findings on diagnostic imaging of other parts of digestive tract: Secondary | ICD-10-CM

## 2017-11-10 DIAGNOSIS — R1319 Other dysphagia: Secondary | ICD-10-CM

## 2017-11-10 NOTE — Progress Notes (Signed)
Patient was seen in office 10/13/17,and was ordered for a hospital EGD in November. Patient is scheduled for 12/07/17, 11:00am at [place] over instructions by phone and mailed out. Patient voiced understanding.

## 2017-11-14 DIAGNOSIS — H02052 Trichiasis without entropian right lower eyelid: Secondary | ICD-10-CM | POA: Diagnosis not present

## 2017-11-14 DIAGNOSIS — H02055 Trichiasis without entropian left lower eyelid: Secondary | ICD-10-CM | POA: Diagnosis not present

## 2017-11-17 ENCOUNTER — Ambulatory Visit: Payer: 59 | Admitting: Podiatry

## 2017-11-18 ENCOUNTER — Ambulatory Visit (INDEPENDENT_AMBULATORY_CARE_PROVIDER_SITE_OTHER): Payer: Medicare HMO | Admitting: Podiatry

## 2017-11-18 ENCOUNTER — Ambulatory Visit: Payer: 59 | Admitting: Podiatry

## 2017-11-18 DIAGNOSIS — M79674 Pain in right toe(s): Secondary | ICD-10-CM

## 2017-11-18 DIAGNOSIS — B351 Tinea unguium: Secondary | ICD-10-CM | POA: Diagnosis not present

## 2017-11-18 DIAGNOSIS — M79675 Pain in left toe(s): Secondary | ICD-10-CM

## 2017-11-18 DIAGNOSIS — M2041 Other hammer toe(s) (acquired), right foot: Secondary | ICD-10-CM

## 2017-11-20 NOTE — Progress Notes (Signed)
Subjective: 84 y.o. returns the office today for painful, elongated, thickened toenails which she cannot trim herself. Denies any redness or drainage around the nails.  Overall the foot is doing better.  She has some occasional cramping, stiff on right foot.  She states that when she stretches that she will occasionally get a cramp. Denies any systemic complaints such as fevers, chills, nausea, vomiting.   PCP: Elenor Quinones C, DO  Objective: AAO 3, NAD DP/PT pulses palpable, CRT less than 3 seconds Nails hypertrophic, dystrophic, elongated, brittle, discolored 10. There is tenderness overlying the nails 1-5 bilaterally. There is no surrounding erythema or drainage along the nail sites. Hammertoe on the right 2nd toe with mild erythema from irritation but no skin breakdown.   No open lesions or pre-ulcerative lesions are identified. No pain with calf compression, swelling, warmth, erythema.  Assessment: Ms. Pennel presents with symptomatic onychomycosis  Plan: -Treatment options including alternatives, risks, complications were discussed -Nails sharply debrided 10 without complication/bleeding. -Offloading to the right second toe. -Continue, stretching exercises at home which we discussed today.  Overall her foot pain is much better. -Discussed daily foot inspection. If there are any changes, to call the office immediately.  -Follow-up in 3 months or sooner if any problems are to arise. In the meantime, encouraged to call the office with any questions, concerns, changes symptoms.  Celesta Gentile, DPM

## 2017-12-07 ENCOUNTER — Ambulatory Visit (HOSPITAL_COMMUNITY)
Admission: RE | Admit: 2017-12-07 | Discharge: 2017-12-07 | Disposition: A | Discharge status: Home / Self Care | Payer: Medicare HMO | Source: Ambulatory Visit | Attending: Gastroenterology | Admitting: Gastroenterology

## 2017-12-07 ENCOUNTER — Encounter (HOSPITAL_COMMUNITY): Payer: Self-pay | Admitting: *Deleted

## 2017-12-07 ENCOUNTER — Other Ambulatory Visit: Payer: Self-pay

## 2017-12-07 ENCOUNTER — Ambulatory Visit (HOSPITAL_COMMUNITY): Payer: Medicare HMO | Admitting: Anesthesiology

## 2017-12-07 ENCOUNTER — Encounter (HOSPITAL_COMMUNITY)
Admission: RE | Disposition: A | Discharge status: Home / Self Care | Payer: Self-pay | Source: Ambulatory Visit | Attending: Gastroenterology

## 2017-12-07 DIAGNOSIS — E039 Hypothyroidism, unspecified: Secondary | ICD-10-CM | POA: Insufficient documentation

## 2017-12-07 DIAGNOSIS — Z8049 Family history of malignant neoplasm of other genital organs: Secondary | ICD-10-CM | POA: Insufficient documentation

## 2017-12-07 DIAGNOSIS — K2950 Unspecified chronic gastritis without bleeding: Secondary | ICD-10-CM | POA: Insufficient documentation

## 2017-12-07 DIAGNOSIS — R1319 Other dysphagia: Secondary | ICD-10-CM

## 2017-12-07 DIAGNOSIS — K219 Gastro-esophageal reflux disease without esophagitis: Secondary | ICD-10-CM | POA: Diagnosis not present

## 2017-12-07 DIAGNOSIS — R12 Heartburn: Secondary | ICD-10-CM | POA: Diagnosis not present

## 2017-12-07 DIAGNOSIS — K259 Gastric ulcer, unspecified as acute or chronic, without hemorrhage or perforation: Secondary | ICD-10-CM | POA: Insufficient documentation

## 2017-12-07 DIAGNOSIS — Z9049 Acquired absence of other specified parts of digestive tract: Secondary | ICD-10-CM | POA: Diagnosis not present

## 2017-12-07 DIAGNOSIS — Z8249 Family history of ischemic heart disease and other diseases of the circulatory system: Secondary | ICD-10-CM | POA: Diagnosis not present

## 2017-12-07 DIAGNOSIS — Z882 Allergy status to sulfonamides status: Secondary | ICD-10-CM | POA: Insufficient documentation

## 2017-12-07 DIAGNOSIS — I10 Essential (primary) hypertension: Secondary | ICD-10-CM | POA: Insufficient documentation

## 2017-12-07 DIAGNOSIS — Z885 Allergy status to narcotic agent status: Secondary | ICD-10-CM | POA: Diagnosis not present

## 2017-12-07 DIAGNOSIS — R1310 Dysphagia, unspecified: Secondary | ICD-10-CM

## 2017-12-07 DIAGNOSIS — R933 Abnormal findings on diagnostic imaging of other parts of digestive tract: Secondary | ICD-10-CM

## 2017-12-07 DIAGNOSIS — K3189 Other diseases of stomach and duodenum: Secondary | ICD-10-CM | POA: Diagnosis not present

## 2017-12-07 DIAGNOSIS — K209 Esophagitis, unspecified: Secondary | ICD-10-CM | POA: Diagnosis not present

## 2017-12-07 DIAGNOSIS — Z9889 Other specified postprocedural states: Secondary | ICD-10-CM | POA: Insufficient documentation

## 2017-12-07 DIAGNOSIS — K228 Other specified diseases of esophagus: Secondary | ICD-10-CM | POA: Diagnosis not present

## 2017-12-07 DIAGNOSIS — R1314 Dysphagia, pharyngoesophageal phase: Secondary | ICD-10-CM | POA: Insufficient documentation

## 2017-12-07 DIAGNOSIS — Z85820 Personal history of malignant melanoma of skin: Secondary | ICD-10-CM | POA: Insufficient documentation

## 2017-12-07 DIAGNOSIS — K210 Gastro-esophageal reflux disease with esophagitis: Secondary | ICD-10-CM | POA: Diagnosis not present

## 2017-12-07 DIAGNOSIS — Z888 Allergy status to other drugs, medicaments and biological substances status: Secondary | ICD-10-CM | POA: Diagnosis not present

## 2017-12-07 HISTORY — PX: BIOPSY: SHX5522

## 2017-12-07 HISTORY — PX: ESOPHAGOGASTRODUODENOSCOPY (EGD) WITH PROPOFOL: SHX5813

## 2017-12-07 SURGERY — ESOPHAGOGASTRODUODENOSCOPY (EGD) WITH PROPOFOL
Anesthesia: Monitor Anesthesia Care

## 2017-12-07 MED ORDER — LACTATED RINGERS IV SOLN
INTRAVENOUS | Status: DC
  Administered 2017-12-07: 1000 mL via INTRAVENOUS

## 2017-12-07 MED ORDER — ESOMEPRAZOLE MAGNESIUM 40 MG PO CPDR: 40.0000 mg | DELAYED_RELEASE_CAPSULE | Freq: Two times a day (BID) | ORAL | 2 refills | Status: DC

## 2017-12-07 MED ORDER — ONDANSETRON HCL 4 MG/2ML IJ SOLN
INTRAMUSCULAR | Status: DC | PRN
  Administered 2017-12-07: 4 mg via INTRAVENOUS

## 2017-12-07 MED ORDER — PROPOFOL 10 MG/ML IV BOLUS
INTRAVENOUS | Status: AC
  Filled 2017-12-07: qty 40

## 2017-12-07 MED ORDER — PROPOFOL 500 MG/50ML IV EMUL
INTRAVENOUS | Status: DC | PRN
  Administered 2017-12-07: 75 ug/kg/min via INTRAVENOUS

## 2017-12-07 MED ORDER — SODIUM CHLORIDE 0.9 % IV SOLN: INTRAVENOUS | Status: DC

## 2017-12-07 SURGICAL SUPPLY — 14 items

## 2017-12-07 NOTE — Op Note (Signed)
[facility] Patient Name: Kayla Lloyd Procedure Date: 12/07/2017 MRN: [id number] Attending MD: Justice Britain , MD Date of Birth: 1923/02/20 CSN: [id number] Age: 84 Admit Type: Outpatient Procedure:                Upper GI endoscopy Indications:              Esophageal dysphagia, Heartburn, Abnormal UGI series Providers:                Justice Britain, MD, Vista Lawman, RN, Tinnie Gens, Technician, Virgia Land, CRNA Referring MD:              Medicines:                Monitored Anesthesia Care Complications:            No immediate complications. Estimated Blood Loss:     Estimated blood loss was minimal. Procedure:                Pre-Anesthesia Assessment:                           - Prior to the procedure, a History and Physical                            was performed, and patient medications and                            allergies were reviewed. The patient's tolerance of                            previous anesthesia was also reviewed. The risks                            and benefits of the procedure and the sedation                            options and risks were discussed with the patient.                            All questions were answered, and informed consent                            was obtained. Prior Anticoagulants: The patient has                            taken no previous anticoagulant or antiplatelet                            agents. ASA Grade Assessment: III - A patient with                            severe systemic disease. After reviewing the risks  and benefits, the patient was deemed in                            satisfactory condition to undergo the procedure.                           After obtaining informed consent, the endoscope was                            passed under direct vision. Throughout the                            procedure, the patient's blood  pressure, pulse, and                            oxygen saturations were monitored continuously. The                            GIF-H190 ([id number]) Olympus adult endoscope was                            introduced through the mouth, and advanced to the                            second part of duodenum. The upper GI endoscopy was                            accomplished without difficulty. The patient                            tolerated the procedure. Scope In: Scope Out: Findings:      No gross lesions were noted in the proximal esophagus.      Diffuse moderate mucosal changes characterized by discoloration and       white-paper flaking were found in the mid-esophagus and in the distal       esophagus. Cells for cytology were obtained by brushing to rule out       Monolial esophagitis.      Biopsies were taken with a cold forceps in the proximal esophagus and in       the mid-esophagus for histology to rule out EoE and placed in a separate       jar. Biopsies were taken with a cold forceps in the distal esophagus for       histology to rule out EoE and placed in a separate jar.      The Z-line was regular and was found 39 cm from the incisors.      There is no endoscopic evidence of stenosis or stricture in the entire       esophagus.      A few dispersed, small non-bleeding erosions were found in the gastric       antrum. There were no stigmata of recent bleeding.      No other gross lesions were noted in the entire examined stomach.       Biopsies were taken with a cold forceps for histology and Helicobacter       pylori testing from  the antrum/incisura/greater curve/lesser curve.      No gross lesions were noted in the duodenal bulb, in the first portion       of the duodenum and in the second portion of the duodenum. Impression:               - No gross lesions in proximal esophagus.                            Discolored, white-paper flaking mucosa in the                             esophagus. Cells for cytology obtained to rule out                            Candida. EoE biopsies obtained.                           - Z-line regular, 39 cm from the incisors.                           - Non-bleeding erosive gastropathy. Otherwise no                            gross lesions in the stomach. Biopsied for HP.                           - No gross lesions in the duodenal bulb, in the                            first portion of the duodenum and in the second                            portion of the duodenum. Moderate Sedation:      Not Applicable - Patient had care per Anesthesia. Recommendation:           - The patient will be observed post-procedure,                            until all discharge criteria are met.                           - Discharge patient to home.                           - Patient has a contact number available for                            emergencies. The signs and symptoms of potential                            delayed complications were discussed with the                            patient. Return to normal activities tomorrow.  Written discharge instructions were provided to the                            patient.                           - Resume previous diet.                           - Continue present medications.                           - Await pathology results.                           - Repeat upper endoscopy for surveillance based on                            pathology results.                           - Increase Nexium to 40 mg BID (Rx to be sent to                            pharmacy) for next 27-month and if positive for HP                            then will need treatment and then decrease back to                            once daily.                           - Dependent on patient's results, if she does not                            have Monolial esophagitis then will need to                             consider Manometry for further evaluation of                            dysphagia symptoms.                           - The findings and recommendations were discussed                            with the patient.                           - The findings and recommendations were discussed                            with the patient's family. Procedure Code(s):        --- Professional ---  60479, Esophagogastroduodenoscopy, flexible,                            transoral; with biopsy, single or multiple Diagnosis Code(s):        --- Professional ---                           K22.8, Other specified diseases of esophagus                           K20.9, Esophagitis, unspecified                           K31.89, Other diseases of stomach and duodenum                           R13.14, Dysphagia, pharyngoesophageal phase                           R12, Heartburn                           R93.3, Abnormal findings on diagnostic imaging of                            other parts of digestive tract CPT copyright 2018 American [employer] Association. All rights reserved. The codes documented in this report are preliminary and upon coder review may  be revised to meet current compliance requirements. Justice Britain, MD 12/07/2017 12:00:14 PM Number of Addenda: 0

## 2017-12-07 NOTE — Transfer of Care (Signed)
Immediate Anesthesia Transfer of Care Note  Patient: Kayla Lloyd  Procedure(s) Performed: ESOPHAGOGASTRODUODENOSCOPY (EGD) WITH PROPOFOL (N/A ) BIOPSY ESOPHAGEAL BRUSHING  Patient Location: PACU  Anesthesia Type:MAC  Level of Consciousness: awake, alert  and oriented  Airway & Oxygen Therapy: Patient Spontanous Breathing and Patient connected to face mask oxygen  Post-op Assessment: Report given to RN and Post -op Vital signs reviewed and stable  Post vital signs: Reviewed and stable  Last Vitals:  Vitals Value Taken Time  BP    Temp    Pulse    Resp    SpO2      Last Pain:  Vitals:   12/07/17 1045  TempSrc: Oral  PainSc: 0-No pain         Complications: No apparent anesthesia complications

## 2017-12-07 NOTE — Anesthesia Postprocedure Evaluation (Signed)
Anesthesia Post Note  Patient: [name]  Procedure(s) Performed: ESOPHAGOGASTRODUODENOSCOPY (EGD) WITH PROPOFOL (N/A ) BIOPSY ESOPHAGEAL BRUSHING     Patient location during evaluation: PACU Anesthesia Type: MAC Level of consciousness: awake and alert Pain management: pain level controlled Vital Signs Assessment: post-procedure vital signs reviewed and stable Respiratory status: spontaneous breathing, nonlabored ventilation and respiratory function stable Cardiovascular status: stable and blood pressure returned to baseline Anesthetic complications: no    Last Vitals:  Vitals:   12/07/17 1140 12/07/17 1150  BP: (!) 124/39 (!) 127/59  Pulse: 69 74  Resp: (!) 23 17  Temp: 36.7 C   SpO2: 93% 100%    Last Pain:  Vitals:   12/07/17 1150  TempSrc:   PainSc: 0-No pain                 Audry Pili

## 2017-12-07 NOTE — Anesthesia Preprocedure Evaluation (Addendum)
Anesthesia Evaluation  Patient identified by MRN, date of birth, ID band Patient awake    Reviewed: Allergy & Precautions, NPO status , Patient's Chart, lab work & pertinent test results  History of Anesthesia Complications Negative for: history of anesthetic complications  Airway Mallampati: II  TM Distance: >3 FB Neck ROM: Full    Dental  (+) Lower Dentures, Upper Dentures   Pulmonary neg pulmonary ROS,    breath sounds clear to auscultation       Cardiovascular hypertension, Pt. on medications  Rhythm:Regular Rate:Normal     Neuro/Psych negative neurological ROS  negative psych ROS   GI/Hepatic Neg liver ROS, GERD  Medicated,  Endo/Other  Hypothyroidism   Renal/GU negative Renal ROS     Musculoskeletal negative musculoskeletal ROS (+)   Abdominal   Peds  Hematology negative hematology ROS (+)   Anesthesia Other Findings   Reproductive/Obstetrics                            Anesthesia Physical Anesthesia Plan  ASA: II  Anesthesia Plan: MAC   Post-op Pain Management:    Induction: Intravenous  PONV Risk Score and Plan: 2 and Propofol infusion and Treatment may vary due to age or medical condition  Airway Management Planned: Nasal Cannula and Natural Airway  Additional Equipment: None  Intra-op Plan:   Post-operative Plan:   Informed Consent: I have reviewed the patients History and Physical, chart, labs and discussed the procedure including the risks, benefits and alternatives for the proposed anesthesia with the patient or authorized representative who has indicated his/her understanding and acceptance.     Plan Discussed with: CRNA and Anesthesiologist  Anesthesia Plan Comments:        Anesthesia Quick Evaluation

## 2017-12-07 NOTE — H&P (Signed)
GASTROENTEROLOGY OUTPATIENT PROCEDURE H&P NOTE   Primary Care Physician: Velna Hatchet, MD  HPI: Kayla Lloyd is a 84 y.o. female who presents for EGD with possible dilation.  Past Medical History:  Diagnosis Date  . Bowel obstruction ([place]) 1950  . Cancer of septum of nose (HCC)    melanoma  . Hypertension   . Hypothyroidism    Past Surgical History:  Procedure Laterality Date  . CHOLECYSTECTOMY    . hysterecrtomy    . left hip surgery    . ROTATOR CUFF REPAIR Right   . SMALL INTESTINE SURGERY     Current Facility-Administered Medications  Medication Dose Route Frequency Provider Last Rate Last Dose  . 0.9 %  sodium chloride infusion   Intravenous Continuous Mansouraty, Telford Nab., MD      . lactated ringers infusion   Intravenous Continuous Mansouraty, Telford Nab., MD       Allergies  Allergen Reactions  . Brimonidine   . Codeine Other (See Comments)  . Levofloxacin Other (See Comments)  . Lidocaine   . Meloxicam   . Oxycodone   . Pregabalin   . Sulfa Antibiotics Other (See Comments)  . Timolol Maleate   . Zolpidem    Family History  Problem Relation Age of Onset  . Uterine cancer Mother   . Hypertension Father   . Colon cancer Neg Hx   . Esophageal cancer Neg Hx   . Liver disease Neg Hx   . Inflammatory bowel disease Neg Hx   . Pancreatic cancer Neg Hx   . Rectal cancer Neg Hx   . Stomach cancer Neg Hx    Social History   Socioeconomic History  . Marital status: Widowed    Spouse name: Not on file  . Number of children: 8  . Years of education: Not on file  . Highest education level: Not on file  Occupational History  . Not on file  Social Needs  . Financial resource strain: Not on file  . Food insecurity:    Worry: Not on file    Inability: Not on file  . Transportation needs:    Medical: Not on file    Non-medical: Not on file  Tobacco Use  . Smoking status: Never Smoker  . Smokeless tobacco: Never Used  Substance and  Sexual Activity  . Alcohol use: No    Frequency: Never    Comment: wine/rare  . Drug use: No  . Sexual activity: Not Currently  Lifestyle  . Physical activity:    Days per week: Not on file    Minutes per session: Not on file  . Stress: Not on file  Relationships  . Social connections:    Talks on phone: Not on file    Gets together: Not on file    Attends religious service: Not on file    Active member of club or organization: Not on file    Attends meetings of clubs or organizations: Not on file    Relationship status: Not on file  . Intimate partner violence:    Fear of current or ex partner: Not on file    Emotionally abused: Not on file    Physically abused: Not on file    Forced sexual activity: Not on file  Other Topics Concern  . Not on file  Social History Narrative  . Not on file    Physical Exam: Vital signs in last 24 hours:     GEN: NAD EYE: Sclerae anicteric  ENT: MMM CV: RR without R/Gs  RESP: CTAB posteriorly GI: Soft, NT/ND NEURO:  Alert & Oriented x 3  Lab Results: No results for input(s): WBC, HGB, HCT, PLT in the last 72 hours. BMET No results for input(s): NA, K, CL, CO2, GLUCOSE, BUN, CREATININE, CALCIUM in the last 72 hours. LFT No results for input(s): PROT, ALBUMIN, AST, ALT, ALKPHOS, BILITOT, BILIDIR, IBILI in the last 72 hours. PT/INR No results for input(s): LABPROT, INR in the last 72 hours.   Impression / Plan: This is a 84 y.o.female who presents for EGD with possible dilation  The risks and benefits of endoscopic evaluation were discussed with the patient; these include but are not limited to the risk of perforation, infection, bleeding, missed lesions, lack of diagnosis, severe illness requiring hospitalization, as well as anesthesia and sedation related illnesses.  The patient is agreeable to proceed.    Justice Britain, MD [facility] Office # [phone]

## 2017-12-07 NOTE — Discharge Instructions (Signed)
YOU HAD AN ENDOSCOPIC PROCEDURE TODAY: Refer to the procedure report and other information in the discharge instructions given to you for any specific questions about what was found during the examination. If this information does not answer your questions, please call [facility] office at [phone] to clarify.   YOU SHOULD EXPECT: Some feelings of bloating in the abdomen. Passage of more gas than usual. Walking can help get rid of the air that was put into your GI tract during the procedure and reduce the bloating. If you had a lower endoscopy (such as a colonoscopy or flexible sigmoidoscopy) you may notice spotting of blood in your stool or on the toilet paper. Some abdominal soreness may be present for a day or two, also.  DIET: Your first meal following the procedure should be a light meal and then it is ok to progress to your normal diet. A half-sandwich or bowl of soup is an example of a good first meal. Heavy or fried foods are harder to digest and may make you feel nauseous or bloated. Drink plenty of fluids but you should avoid alcoholic beverages for 24 hours. If you had a esophageal dilation, please see attached instructions for diet.    ACTIVITY: Your care partner should take you home directly after the procedure. You should plan to take it easy, moving slowly for the rest of the day. You can resume normal activity the day after the procedure however YOU SHOULD NOT DRIVE, use power tools, machinery or perform tasks that involve climbing or major physical exertion for 24 hours (because of the sedation medicines used during the test).   SYMPTOMS TO REPORT IMMEDIATELY: A gastroenterologist can be reached at any hour. Please call [phone]  for any of the following symptoms:   Following upper endoscopy (EGD, EUS, ERCP, esophageal dilation) Vomiting of blood or coffee ground material  New, significant abdominal pain  New, significant chest pain or pain under the shoulder blades  Painful or  persistently difficult swallowing  New shortness of breath  Black, tarry-looking or red, bloody stools  FOLLOW UP:  If any biopsies were taken you will be contacted by phone or by letter within the next 1-3 weeks. Call [phone]  if you have not heard about the biopsies in 3 weeks.  Please also call with any specific questions about appointments or follow up tests.  

## 2017-12-09 ENCOUNTER — Telehealth: Payer: Self-pay | Admitting: Gastroenterology

## 2017-12-12 ENCOUNTER — Encounter: Payer: Self-pay | Admitting: Gastroenterology

## 2017-12-12 NOTE — Telephone Encounter (Signed)
Spoke to patient to inform her that dr Rush Landmark wants her to increase her Nexium to twice daily. After reviewing results from H Pylori he may decrease back to 1 tab daily. Patient voiced understanding.

## 2017-12-13 ENCOUNTER — Telehealth: Payer: Self-pay | Admitting: Gastroenterology

## 2017-12-13 NOTE — Telephone Encounter (Signed)
The pt was advised to take her nexium twice daily.  AM and PM.  The pt has been advised of the information and verbalized understanding.

## 2017-12-19 DIAGNOSIS — I10 Essential (primary) hypertension: Secondary | ICD-10-CM | POA: Diagnosis not present

## 2017-12-19 DIAGNOSIS — E038 Other specified hypothyroidism: Secondary | ICD-10-CM | POA: Diagnosis not present

## 2017-12-30 ENCOUNTER — Other Ambulatory Visit: Payer: Self-pay | Admitting: Internal Medicine

## 2017-12-30 DIAGNOSIS — E7849 Other hyperlipidemia: Secondary | ICD-10-CM | POA: Diagnosis not present

## 2017-12-30 DIAGNOSIS — R1032 Left lower quadrant pain: Secondary | ICD-10-CM

## 2017-12-30 DIAGNOSIS — D508 Other iron deficiency anemias: Secondary | ICD-10-CM | POA: Diagnosis not present

## 2017-12-30 DIAGNOSIS — I10 Essential (primary) hypertension: Secondary | ICD-10-CM | POA: Diagnosis not present

## 2017-12-30 DIAGNOSIS — F418 Other specified anxiety disorders: Secondary | ICD-10-CM | POA: Diagnosis not present

## 2017-12-30 DIAGNOSIS — Z0000 Encounter for general adult medical examination without abnormal findings: Secondary | ICD-10-CM | POA: Diagnosis not present

## 2017-12-30 DIAGNOSIS — E038 Other specified hypothyroidism: Secondary | ICD-10-CM | POA: Diagnosis not present

## 2017-12-30 DIAGNOSIS — M160 Bilateral primary osteoarthritis of hip: Secondary | ICD-10-CM | POA: Diagnosis not present

## 2017-12-30 DIAGNOSIS — M25512 Pain in left shoulder: Secondary | ICD-10-CM | POA: Diagnosis not present

## 2017-12-30 DIAGNOSIS — M810 Age-related osteoporosis without current pathological fracture: Secondary | ICD-10-CM | POA: Diagnosis not present

## 2018-01-02 ENCOUNTER — Telehealth: Payer: Self-pay | Admitting: Gastroenterology

## 2018-01-02 ENCOUNTER — Other Ambulatory Visit: Payer: Self-pay | Admitting: Obstetrics and Gynecology

## 2018-01-02 DIAGNOSIS — N952 Postmenopausal atrophic vaginitis: Secondary | ICD-10-CM

## 2018-01-03 ENCOUNTER — Ambulatory Visit
Admission: RE | Admit: 2018-01-03 | Discharge: 2018-01-03 | Disposition: A | Discharge status: Home / Self Care | Payer: Medicare HMO | Source: Ambulatory Visit | Attending: Internal Medicine | Admitting: Internal Medicine

## 2018-01-03 ENCOUNTER — Telehealth: Payer: Self-pay

## 2018-01-03 ENCOUNTER — Other Ambulatory Visit: Payer: Self-pay | Admitting: Gastroenterology

## 2018-01-03 DIAGNOSIS — R1032 Left lower quadrant pain: Secondary | ICD-10-CM

## 2018-01-03 DIAGNOSIS — K5790 Diverticulosis of intestine, part unspecified, without perforation or abscess without bleeding: Secondary | ICD-10-CM | POA: Diagnosis not present

## 2018-01-03 MED ORDER — IOPAMIDOL (ISOVUE-300) INJECTION 61%
100.0000 mL | Freq: Once | INTRAVENOUS | Status: AC | PRN
  Administered 2018-01-03: 100 mL via INTRAVENOUS

## 2018-01-03 MED ORDER — ESOMEPRAZOLE MAGNESIUM 40 MG PO CPDR: 40.0000 mg | DELAYED_RELEASE_CAPSULE | Freq: Two times a day (BID) | ORAL | 6 refills | Status: DC

## 2018-01-03 NOTE — Telephone Encounter (Signed)
Sent Nexium twice daily to pharmacy with refills

## 2018-01-03 NOTE — Telephone Encounter (Signed)
Attempted to return call, no answer, unable to leave vm.

## 2018-01-04 ENCOUNTER — Telehealth: Payer: Self-pay | Admitting: Gastroenterology

## 2018-01-04 ENCOUNTER — Telehealth: Payer: Self-pay

## 2018-01-04 NOTE — Telephone Encounter (Signed)
Returned call, pt would like refill on vaginal cream, routed to provider for review.

## 2018-01-04 NOTE — Telephone Encounter (Signed)
Spoke to patient. PA has been started

## 2018-01-05 ENCOUNTER — Telehealth: Payer: Self-pay

## 2018-01-05 DIAGNOSIS — N952 Postmenopausal atrophic vaginitis: Secondary | ICD-10-CM

## 2018-01-05 MED ORDER — ESTROGENS, CONJUGATED 0.625 MG/GM VA CREA: 1.0000 | TOPICAL_CREAM | Freq: Every day | VAGINAL | 3 refills | Status: DC

## 2018-01-05 NOTE — Telephone Encounter (Signed)
Spoke with pt and advised that provider approved refill for Premarin.

## 2018-01-05 NOTE — Telephone Encounter (Signed)
Ok to refill Premarin Cream with 3 refills.

## 2018-01-09 DIAGNOSIS — N3001 Acute cystitis with hematuria: Secondary | ICD-10-CM | POA: Diagnosis not present

## 2018-01-24 ENCOUNTER — Ambulatory Visit: Payer: Medicare HMO | Admitting: Gastroenterology

## 2018-02-08 ENCOUNTER — Ambulatory Visit: Payer: Medicare HMO | Admitting: Gastroenterology

## 2018-02-08 DIAGNOSIS — K219 Gastro-esophageal reflux disease without esophagitis: Secondary | ICD-10-CM | POA: Diagnosis not present

## 2018-02-08 DIAGNOSIS — R933 Abnormal findings on diagnostic imaging of other parts of digestive tract: Secondary | ICD-10-CM | POA: Diagnosis not present

## 2018-02-08 DIAGNOSIS — M722 Plantar fascial fibromatosis: Secondary | ICD-10-CM | POA: Diagnosis not present

## 2018-02-08 DIAGNOSIS — R1310 Dysphagia, unspecified: Secondary | ICD-10-CM | POA: Diagnosis not present

## 2018-02-08 DIAGNOSIS — R269 Unspecified abnormalities of gait and mobility: Secondary | ICD-10-CM | POA: Diagnosis not present

## 2018-02-09 ENCOUNTER — Encounter: Payer: Self-pay | Admitting: Gastroenterology

## 2018-02-09 ENCOUNTER — Other Ambulatory Visit (HOSPITAL_COMMUNITY): Payer: Self-pay

## 2018-02-09 ENCOUNTER — Ambulatory Visit (INDEPENDENT_AMBULATORY_CARE_PROVIDER_SITE_OTHER): Payer: Medicare HMO | Admitting: Gastroenterology

## 2018-02-09 VITALS — BP 108/64 | HR 70 | Ht 59.0 in | Wt 129.2 lb

## 2018-02-09 DIAGNOSIS — K219 Gastro-esophageal reflux disease without esophagitis: Secondary | ICD-10-CM

## 2018-02-09 DIAGNOSIS — R1310 Dysphagia, unspecified: Secondary | ICD-10-CM

## 2018-02-09 MED ORDER — ESOMEPRAZOLE MAGNESIUM 40 MG PO CPDR: 40.0000 mg | DELAYED_RELEASE_CAPSULE | Freq: Two times a day (BID) | ORAL | 3 refills | Status: DC

## 2018-02-09 NOTE — Progress Notes (Signed)
[facility] VISIT   Primary Care Provider Velna Hatchet, [place] [address] [phone]  Patient Profile: Kayla Lloyd is a 85 y.o. female with a pmh significant for hypertension, hypothyroidism, prior bowel obstruction (no resection many years ago), melanoma of the nose, status post cholecystectomy, status post hysterectomy, sleep apnea.  The patient presents to the [facility] for an evaluation and management of problem(s) noted below:  Problem List 1. Dysphagia, unspecified type   2. Gastroesophageal reflux disease, esophagitis presence not specified     History of Present Illness Please see initial consultation note for full details of HPI and plan of action.  Interval History The patient returns for scheduled follow-up.  Overall, the patient is stable from her initial presentation.  She is still experiencing at times issues of a sensation of solid food dysphagia.  She has to move her "stomach in the subxiphoid region to the right turn her head backwards and swallow distinctly to allow things to pass at times.  She has had an approximate 2 pound weight loss over this course in time but has not really felt any different or change in her clothing.  The patient denies overt acid reflux issues currently.  She has had issues with trying to get the high-dose Nexium and we had notation in the chart that a prior authorization had been in process but it looks like this was denied however we never received information about that.  We will work on trying to understand that further.  So she is only been taking Nexium once daily.  Patient feels that pills are still getting caught in the back of her throat just as they pass beyond the esophagus.  She is never had a speech-language pathology evaluation but she has had the barium study that was performed before her procedure.  Otherwise she remains as active as she can be  and wants to stay as healthy as possible.  She states that she is noting throat clearing in the morning is persisting and at times there is a significant amount of clear phlegm that she is bringing up.  GI Review of Systems Positive as above Negative for true odynophagia, early satiety, abdominal pain, melena, hematochezia, change in bowel habits  Review of Systems General: Denies fevers/chills HEENT: States that she has noted issues with a carpeting on her tongue at times that she needs to scrape off Cardiovascular: Denies chest pain Pulmonary: Denies shortness of breath Gastroenterological: See HPI Genitourinary: Denies darkened urine Hematological: Positive for easy bruising/bleeding Dermatological: Denies jaundice Psychological: Mood is stable   Medications Current Outpatient Medications  Medication Sig Dispense Refill  . ALPRAZolam (XANAX) 0.25 MG tablet Take 0.25 mg by mouth 3 (three) times daily.     . BC FAST PAIN RELIEF [phone] MG PACK Take 1 packet by mouth daily as needed (for pain.).    Marland Kitchen bisacodyl (DULCOLAX) 5 MG EC tablet Take 5 mg by mouth daily as needed (for constipation.).     Marland Kitchen castor oil liquid Take 15 mLs by mouth daily as needed for moderate constipation.    . cetirizine (ZYRTEC) 10 MG tablet Take 10 mg by mouth daily.     . Cholecalciferol (VITAMIN D3) 50 MCG (2000 UT) TABS Take 2,000 Units by mouth daily.    Marland Kitchen conjugated estrogens (PREMARIN) vaginal cream Place 1 Applicatorful vaginally daily. Place 1/2 applicator vaginally twice a week 42.5 g 3  . docusate sodium (DULCOLAX) 100 MG capsule Take  100 mg by mouth 2 (two) times daily as needed (for constipation).     Marland Kitchen esomeprazole (NEXIUM) 40 MG capsule Take 1 capsule (40 mg total) by mouth 2 (two) times daily. 60 capsule 6  . Ginkgo Biloba 120 MG CAPS Take 120 mg by mouth daily.    . Lactobacillus (PROBIOTIC ACIDOPHILUS PO) Take 1 capsule by mouth daily.    Marland Kitchen levothyroxine (SYNTHROID, LEVOTHROID) 75 MCG  tablet Take 75 mcg daily before breakfast by mouth.    . Liniments (SALONPAS PAIN RELIEF PATCH EX) Place 1 patch onto the skin daily as needed (for shoulder pain.).    Marland Kitchen loperamide (IMODIUM) 2 MG capsule Take by mouth as needed for diarrhea or loose stools.    Marland Kitchen losartan (COZAAR) 100 MG tablet Take 100 mg by mouth daily.  5  . Magnesium 500 MG TABS Take 500 mg by mouth 3 (three) times a week.    . Menthol, Topical Analgesic, 154 MG PADS Place 1 each onto the skin daily as needed (for hip pain.).    Marland Kitchen PROLIA 60 MG/ML SOSY injection Take 60 mg by mouth every 6 (six) months.  0  . Pumpkin Seed-Soy Germ (AZO BLADDER CONTROL/GO-LESS PO) Take 1 tablet by mouth daily as needed (for bladder pain.).    Marland Kitchen simvastatin (ZOCOR) 40 MG tablet Take 40 mg daily by mouth.    . traMADol (ULTRAM) 50 MG tablet Take 50 mg by mouth every 6 (six) hours as needed (pain).    Marland Kitchen trolamine salicylate (ASPERCREME) 10 % cream Apply 1 application topically 3 (three) times daily as needed for muscle pain.    . vitamin C (ASCORBIC ACID) 500 MG tablet Take 500 mg by mouth daily.    Marland Kitchen esomeprazole (NEXIUM) 40 MG capsule Take 1 capsule (40 mg total) by mouth 2 (two) times daily. 60 capsule 3   No current facility-administered medications for this visit.     Allergies Allergies  Allergen Reactions  . Brimonidine   . Codeine Other (See Comments)  . Levofloxacin Other (See Comments)  . Lidocaine   . Meloxicam   . Oxycodone   . Pregabalin   . Sulfa Antibiotics Other (See Comments)  . Timolol Maleate   . Zolpidem     Histories Past Medical History:  Diagnosis Date  . Bowel obstruction ([place]) 1950  . Cancer of septum of nose (HCC)    melanoma  . Hypertension   . Hypothyroidism    Past Surgical History:  Procedure Laterality Date  . BIOPSY  12/07/2017   Procedure: BIOPSY;  Surgeon: Rush Landmark Telford Nab., MD;  Location: [facility];  Service: Gastroenterology;;  . CHOLECYSTECTOMY    . ESOPHAGOGASTRODUODENOSCOPY  (EGD) WITH PROPOFOL N/A 12/07/2017   Procedure: ESOPHAGOGASTRODUODENOSCOPY (EGD) WITH PROPOFOL;  Surgeon: Rush Landmark Telford Nab., MD;  Location: [facility];  Service: Gastroenterology;  Laterality: N/A;  May need Dilation  . hysterecrtomy    . left hip surgery    . ROTATOR CUFF REPAIR Right   . SMALL INTESTINE SURGERY     Social History   Socioeconomic History  . Marital status: Widowed    Spouse name: Not on file  . Number of children: 8  . Years of education: Not on file  . Highest education level: Not on file  Occupational History  . Not on file  Social Needs  . Financial resource strain: Not on file  . Food insecurity:    Worry: Not on file    Inability: Not on file  .  Transportation needs:    Medical: Not on file    Non-medical: Not on file  Tobacco Use  . Smoking status: Never Smoker  . Smokeless tobacco: Never Used  Substance and Sexual Activity  . Alcohol use: No    Frequency: Never    Comment: wine/rare  . Drug use: No  . Sexual activity: Not Currently  Lifestyle  . Physical activity:    Days per week: Not on file    Minutes per session: Not on file  . Stress: Not on file  Relationships  . Social connections:    Talks on phone: Not on file    Gets together: Not on file    Attends religious service: Not on file    Active member of club or organization: Not on file    Attends meetings of clubs or organizations: Not on file    Relationship status: Not on file  . Intimate partner violence:    Fear of current or ex partner: Not on file    Emotionally abused: Not on file    Physically abused: Not on file    Forced sexual activity: Not on file  Other Topics Concern  . Not on file  Social History Narrative  . Not on file   Family History  Problem Relation Age of Onset  . Uterine cancer Mother   . Hypertension Father   . Colon cancer Neg Hx   . Esophageal cancer Neg Hx   . Liver disease Neg Hx   . Inflammatory bowel disease Neg Hx   . Pancreatic  cancer Neg Hx   . Rectal cancer Neg Hx   . Stomach cancer Neg Hx    I have reviewed her medical, social, and family history in detail and updated the electronic medical record as necessary.    PHYSICAL EXAMINATION  BP 108/64   Pulse 70   Ht 4\' 11"  (1.499 m)   Wt 129 lb 3 oz (58.6 kg)   BMI 26.09 kg/m  Wt Readings from Last 3 Encounters:  02/09/18 129 lb 3 oz (58.6 kg)  12/07/17 132 lb 0.9 oz (59.9 kg)  10/13/17 132 lb 2 oz (59.9 kg)  GEN: NAD, appears younger than stated age, doesn't appear chronically ill, accompanied by granddaughter PSYCH: Cooperative, without pressured speech EYE: Conjunctivae pink, sclerae anicteric ENT: MMM, without oral ulcers, no erythema or exudates noted and no evidence of thrush NECK: Supple CV: RR without R/Gs RESP: Clear to auscultation bilaterally without adventitious sounds present GI: NABS, soft, NT/ND, without rebound or guarding, no HSM appreciated, surgical scar appreciated in mid abdomen MSK/EXT: No significant lower extremity edema today SKIN: No jaundice NEURO:  Alert & Oriented x 3, no focal deficits   REVIEW OF DATA  I reviewed the following data at the time of this encounter:  GI Procedures and Studies  November 2019 EGD - No gross lesions in proximal esophagus. Discolored, white-paper flaking mucosa in the esophagus. Cells for cytology obtained to rule out Candida. EoE biopsies obtained. - Z-line regular, 39 cm from the incisors. - Non-bleeding erosive gastropathy. Otherwise no gross lesions in the stomach. Biopsied for HP. - No gross lesions in the duodenal bulb, in the first portion of the duodenum and in the second portion of the duodenum. Diagnosis 1. Stomach, biopsy - MILD CHRONIC GASTRITIS. - NEGATIVE FOR HELICOBACTER PYLORI. 2. Esophagus, biopsy, distal - SQUAMOUS ESOPHAGEAL EPITHELIUM WITH NO SIGNIFICANT PATHOLOGIC FINDINGS. - NEGATIVE FOR INCREASED INTRAEPITHELIAL EOSINOPHILS. 3. Esophagus, biopsy, mid proximal -  SQUAMOUS ESOPHAGEAL EPITHELIUM WITH NO SIGNIFICANT PATHOLOGIC FINDINGS. - NEGATIVE FOR INCREASED INTRAEPITHELIAL EOSINOPHILS. Diagnosis BRUSHING, ESOPHAGEAL (SPECIMEN 1 OF 1 COLLECTED 12/07/17): NO MALIGNANT CELLS IDENTIFIED.  Laboratory Studies  Reviewed in epic  Imaging Studies  No relevant studies to review   ASSESSMENT  Ms. Stille is a 85 y.o. female with a pmh significant for hypertension, hypothyroidism, prior bowel obstruction (no resection many years ago), melanoma of the nose, status post cholecystectomy, status post hysterectomy, sleep apnea.  The patient is seen today for evaluation and management of:  1. Dysphagia, unspecified type   2. Gastroesophageal reflux disease, esophagitis presence not specified    The patient is hemodynamically stable.  The patient's symptoms are not overtly true esophageal dysphagia but she may also have a component of oropharyngeal dysphagia.  Would like to further evaluate this with a modified barium swallow with speech language pathology evaluation as well.  If this is negative then I think an esophageal manometry may be reasonable for the patient.  I discussed with her and her granddaughter that the likelihood of finding a lesion that we can endoscopically treat would be low but if she had spasm that was occurring at times it may be we can try other medications to help with that.  If this is not the case then we would just monitor the patient closely and see how her symptoms were doing.  For now the patient is doing well otherwise.  We will work on trying to understand the prior authorization issues because I truly wanted the patient to be on twice daily PPI to have mitigated any of her symptoms as well.  If she has issues with obtaining a prior authorization happy to do a peer to peer to consider the twice daily dosing of Nexium versus the role of a transition to another PPI for twice daily dosing.  All patient questions were answered, to the best of my  ability, and the patient agrees to the aforementioned plan of action with follow-up as indicated.   PLAN  Proceed with Nexium 40 mg twice daily if necessary prior authorization can be considered SLP evaluation with modified barium swallow Consider manometry in the future   Orders Placed This Encounter  Procedures  . SLP modified barium swallow    New Prescriptions   ESOMEPRAZOLE (NEXIUM) 40 MG CAPSULE    Take 1 capsule (40 mg total) by mouth 2 (two) times daily.   Modified Medications   No medications on file    Planned Follow Up: No follow-ups on file.   Justice Britain, MD [facility] Office # [phone]

## 2018-02-09 NOTE — Patient Instructions (Addendum)
You have been scheduled for a modified barium swallow on 02/23/18 at 1pm at [facility]. Please arrive 15 minutes prior to your test for registration. You will go to  Radiology (1st Floor) for your appointment. Should you need to cancel or reschedule your appointment, please contact [phone] [address] [place]) or [phone] [place]). _____________________________________________________________________ A Modified Barium Swallow Study, or MBS, is a special x-ray that is taken to check swallowing skills. It is carried out by a Stage manager and a Psychologist, clinical (SLP). During this test, yourmouth, throat, and esophagus, a muscular tube which connects your mouth to your stomach, is checked. The test will help you, your doctor, and the SLP plan what types of foods and liquids are easier for you to swallow. The SLP will also identify positions and ways to help you swallow more easily and safely. What will happen during an MBS? You will be taken to an x-ray room and seated comfortably. You will be asked to swallow small amounts of food and liquid mixed with barium. Barium is a liquid or paste that allows images of your mouth, throat and esophagus to be seen on x-ray. The x-ray captures moving images of the food you are swallowing as it travels from your mouth through your throat and into your esophagus. This test helps identify whether food or liquid is entering your lungs (aspiration). The test also shows which part of your mouth or throat lacks strength or coordination to move the food or liquid in the right direction. This test typically takes 30 minutes to 1 hour to complete. _______________________________________________________________________ We have sent the following medications to your pharmacy for you to pick up at your convenience: Nexium  Please contact the office to schedule a follow up appointment for the end of February  Thank you for entrusting me with your care and  choosing [facility] care.  Dr Rush Landmark

## 2018-02-10 ENCOUNTER — Encounter: Payer: Self-pay | Admitting: Gastroenterology

## 2018-02-16 ENCOUNTER — Ambulatory Visit: Payer: Medicare HMO | Admitting: Podiatry

## 2018-02-21 ENCOUNTER — Encounter: Payer: Self-pay | Admitting: Podiatry

## 2018-02-21 ENCOUNTER — Ambulatory Visit (INDEPENDENT_AMBULATORY_CARE_PROVIDER_SITE_OTHER): Payer: Medicare HMO | Admitting: Podiatry

## 2018-02-21 DIAGNOSIS — B351 Tinea unguium: Secondary | ICD-10-CM

## 2018-02-21 DIAGNOSIS — M7661 Achilles tendinitis, right leg: Secondary | ICD-10-CM | POA: Diagnosis not present

## 2018-02-21 DIAGNOSIS — M79675 Pain in left toe(s): Secondary | ICD-10-CM

## 2018-02-21 DIAGNOSIS — M79674 Pain in right toe(s): Secondary | ICD-10-CM

## 2018-02-21 NOTE — Patient Instructions (Signed)

## 2018-02-22 NOTE — Progress Notes (Signed)
Subjective: 85 y.o. returns the office today for painful, elongated, thickened toenails which she cannot trim herelf. Denies any redness or drainage around the nails.  She also states that she is using pain more to the back of her ankle she points on the Achilles tendon.  She states that she gets pain to this area when do a lot of walking in the mornings when she first gets up.  She states it feels like it is pulling.  Denies any recent injury or trauma she denies any swelling.  No treatment.  Denies any acute changes since last appointment and no other complaints today. Denies any systemic complaints such as fevers, chills, nausea, vomiting.   PCP: Velna Hatchet, MD  Objective: AAO 3, NAD DP/PT pulses palpable, CRT less than 3 seconds Nails hypertrophic, dystrophic, elongated, brittle, discolored 10. There is tenderness overlying the nails 1-5 bilaterally. There is no surrounding erythema or drainage along the nail sites. No open lesions or pre-ulcerative lesions are identified. There is mild tenderness palpation on the Achilles tendon on the mid substance but overall the tendon appears to be intact.  There is a negative Thompson test today.  There is no pain with lateral compression of calcaneus.  No pain to the plantar heel.  No other areas of tenderness elicited at this time.  Equinus is present No other areas of tenderness bilateral lower extremities. No overlying edema, erythema, increased warmth. No pain with calf compression, swelling, warmth, erythema.  Assessment: Patient presents with symptomatic onychomycosis; right Achilles tendinitis  Plan: -Treatment options including alternatives, risks, complications were discussed -Nails sharply debrided 10 without complication/bleeding. -In regards to the Achilles tendinitis I dispensed a night splint.  I want her to continue with stretching, icing exercises well we discussed ice to the area.  She has a heel lift in her shoe which is been  somewhat helpful and continue with this as well. -Discussed daily foot inspection. If there are any changes, to call the office immediately.  -Follow-up in 3 months or sooner if any problems are to arise. In the meantime, encouraged to call the office with any questions, concerns, changes symptoms.  Celesta Gentile, DPM

## 2018-02-23 ENCOUNTER — Ambulatory Visit (HOSPITAL_COMMUNITY)
Admission: RE | Admit: 2018-02-23 | Discharge: 2018-02-23 | Disposition: A | Discharge status: Home / Self Care | Payer: Medicare HMO | Source: Ambulatory Visit | Attending: Gastroenterology | Admitting: Gastroenterology

## 2018-02-23 DIAGNOSIS — R1310 Dysphagia, unspecified: Secondary | ICD-10-CM | POA: Insufficient documentation

## 2018-02-23 DIAGNOSIS — K219 Gastro-esophageal reflux disease without esophagitis: Secondary | ICD-10-CM | POA: Diagnosis not present

## 2018-03-02 ENCOUNTER — Other Ambulatory Visit: Payer: Self-pay | Admitting: *Deleted

## 2018-03-02 MED ORDER — PHENAZOPYRIDINE HCL 200 MG PO TABS: 200.0000 mg | ORAL_TABLET | Freq: Three times a day (TID) | ORAL | 1 refills | Status: DC | PRN

## 2018-03-08 DIAGNOSIS — N9489 Other specified conditions associated with female genital organs and menstrual cycle: Secondary | ICD-10-CM | POA: Diagnosis not present

## 2018-03-08 DIAGNOSIS — N819 Female genital prolapse, unspecified: Secondary | ICD-10-CM | POA: Diagnosis not present

## 2018-03-08 DIAGNOSIS — N3946 Mixed incontinence: Secondary | ICD-10-CM | POA: Diagnosis not present

## 2018-03-08 DIAGNOSIS — N952 Postmenopausal atrophic vaginitis: Secondary | ICD-10-CM | POA: Diagnosis not present

## 2018-03-08 DIAGNOSIS — R8271 Bacteriuria: Secondary | ICD-10-CM | POA: Diagnosis not present

## 2018-03-08 DIAGNOSIS — K5900 Constipation, unspecified: Secondary | ICD-10-CM | POA: Diagnosis not present

## 2018-03-11 DIAGNOSIS — K219 Gastro-esophageal reflux disease without esophagitis: Secondary | ICD-10-CM | POA: Diagnosis not present

## 2018-03-11 DIAGNOSIS — M722 Plantar fascial fibromatosis: Secondary | ICD-10-CM | POA: Diagnosis not present

## 2018-03-11 DIAGNOSIS — R1310 Dysphagia, unspecified: Secondary | ICD-10-CM | POA: Diagnosis not present

## 2018-03-11 DIAGNOSIS — R269 Unspecified abnormalities of gait and mobility: Secondary | ICD-10-CM | POA: Diagnosis not present

## 2018-03-11 DIAGNOSIS — R933 Abnormal findings on diagnostic imaging of other parts of digestive tract: Secondary | ICD-10-CM | POA: Diagnosis not present

## 2018-03-20 DIAGNOSIS — M810 Age-related osteoporosis without current pathological fracture: Secondary | ICD-10-CM | POA: Diagnosis not present

## 2018-03-20 DIAGNOSIS — M1990 Unspecified osteoarthritis, unspecified site: Secondary | ICD-10-CM | POA: Diagnosis not present

## 2018-03-20 DIAGNOSIS — Z79899 Other long term (current) drug therapy: Secondary | ICD-10-CM | POA: Diagnosis not present

## 2018-03-20 DIAGNOSIS — M7061 Trochanteric bursitis, right hip: Secondary | ICD-10-CM | POA: Diagnosis not present

## 2018-03-20 DIAGNOSIS — M25551 Pain in right hip: Secondary | ICD-10-CM | POA: Diagnosis not present

## 2018-03-20 DIAGNOSIS — M2550 Pain in unspecified joint: Secondary | ICD-10-CM | POA: Diagnosis not present

## 2018-04-09 DIAGNOSIS — R1310 Dysphagia, unspecified: Secondary | ICD-10-CM | POA: Diagnosis not present

## 2018-04-09 DIAGNOSIS — M722 Plantar fascial fibromatosis: Secondary | ICD-10-CM | POA: Diagnosis not present

## 2018-04-09 DIAGNOSIS — R269 Unspecified abnormalities of gait and mobility: Secondary | ICD-10-CM | POA: Diagnosis not present

## 2018-04-09 DIAGNOSIS — K219 Gastro-esophageal reflux disease without esophagitis: Secondary | ICD-10-CM | POA: Diagnosis not present

## 2018-05-02 DIAGNOSIS — M810 Age-related osteoporosis without current pathological fracture: Secondary | ICD-10-CM | POA: Diagnosis not present

## 2018-05-10 DIAGNOSIS — R1310 Dysphagia, unspecified: Secondary | ICD-10-CM | POA: Diagnosis not present

## 2018-05-10 DIAGNOSIS — R269 Unspecified abnormalities of gait and mobility: Secondary | ICD-10-CM | POA: Diagnosis not present

## 2018-05-10 DIAGNOSIS — M722 Plantar fascial fibromatosis: Secondary | ICD-10-CM | POA: Diagnosis not present

## 2018-05-10 DIAGNOSIS — K219 Gastro-esophageal reflux disease without esophagitis: Secondary | ICD-10-CM | POA: Diagnosis not present

## 2018-05-23 ENCOUNTER — Encounter: Payer: Self-pay | Admitting: Podiatry

## 2018-05-23 ENCOUNTER — Other Ambulatory Visit: Payer: Self-pay

## 2018-05-23 ENCOUNTER — Ambulatory Visit (INDEPENDENT_AMBULATORY_CARE_PROVIDER_SITE_OTHER): Payer: Medicare HMO | Admitting: Podiatry

## 2018-05-23 VITALS — Temp 97.5°F

## 2018-05-23 DIAGNOSIS — M79675 Pain in left toe(s): Secondary | ICD-10-CM

## 2018-05-23 DIAGNOSIS — M7661 Achilles tendinitis, right leg: Secondary | ICD-10-CM | POA: Diagnosis not present

## 2018-05-23 DIAGNOSIS — M79674 Pain in right toe(s): Secondary | ICD-10-CM | POA: Diagnosis not present

## 2018-05-23 DIAGNOSIS — B351 Tinea unguium: Secondary | ICD-10-CM

## 2018-05-23 DIAGNOSIS — G629 Polyneuropathy, unspecified: Secondary | ICD-10-CM | POA: Diagnosis not present

## 2018-05-23 MED ORDER — GABAPENTIN 100 MG PO CAPS: 100.0000 mg | ORAL_CAPSULE | Freq: Every day | ORAL | 0 refills | Status: DC

## 2018-05-23 MED ORDER — GABAPENTIN 100 MG PO CAPS: 100.0000 mg | ORAL_CAPSULE | Freq: Three times a day (TID) | ORAL | 0 refills | Status: DC

## 2018-05-23 NOTE — Patient Instructions (Signed)
Gabapentin capsules or tablets What is this medicine? GABAPENTIN (GA ba pen tin) is used to control seizures in certain types of epilepsy. It is also used to treat certain types of nerve pain. This medicine may be used for other purposes; ask your health care provider or pharmacist if you have questions. COMMON BRAND NAME(S): Active-PAC with Gabapentin, Gabarone, Neurontin What should I tell my health care provider before I take this medicine? They need to know if you have any of these conditions: -kidney disease -suicidal thoughts, plans, or attempt; a previous suicide attempt by you or a family member -an unusual or allergic reaction to gabapentin, other medicines, foods, dyes, or preservatives -pregnant or trying to get pregnant -breast-feeding How should I use this medicine? Take this medicine by mouth with a glass of water. Follow the directions on the prescription label. You can take it with or without food. If it upsets your stomach, take it with food. Take your medicine at regular intervals. Do not take it more often than directed. Do not stop taking except on your doctor's advice. If you are directed to break the 600 or 800 mg tablets in half as part of your dose, the extra half tablet should be used for the next dose. If you have not used the extra half tablet within 28 days, it should be thrown away. A special MedGuide will be given to you by the pharmacist with each prescription and refill. Be sure to read this information carefully each time. Talk to your pediatrician regarding the use of this medicine in children. While this drug may be prescribed for children as young as 3 years for selected conditions, precautions do apply. Overdosage: If you think you have taken too much of this medicine contact a poison control center or emergency room at once. NOTE: This medicine is only for you. Do not share this medicine with others. What if I miss a dose? If you miss a dose, take it as soon  as you can. If it is almost time for your next dose, take only that dose. Do not take double or extra doses. What may interact with this medicine? Do not take this medicine with any of the following medications: -other gabapentin products This medicine may also interact with the following medications: -alcohol -antacids -antihistamines for allergy, cough and cold -certain medicines for anxiety or sleep -certain medicines for depression or psychotic disturbances -homatropine; hydrocodone -naproxen -narcotic medicines (opiates) for pain -phenothiazines like chlorpromazine, mesoridazine, prochlorperazine, thioridazine This list may not describe all possible interactions. Give your health care provider a list of all the medicines, herbs, non-prescription drugs, or dietary supplements you use. Also tell them if you smoke, drink alcohol, or use illegal drugs. Some items may interact with your medicine. What should I watch for while using this medicine? Visit your doctor or health care professional for regular checks on your progress. You may want to keep a record at home of how you feel your condition is responding to treatment. You may want to share this information with your doctor or health care professional at each visit. You should contact your doctor or health care professional if your seizures get worse or if you have any new types of seizures. Do not stop taking this medicine or any of your seizure medicines unless instructed by your doctor or health care professional. Stopping your medicine suddenly can increase your seizures or their severity. Wear a medical identification bracelet or chain if you are taking this medicine for   seizures, and carry a card that lists all your medications. You may get drowsy, dizzy, or have blurred vision. Do not drive, use machinery, or do anything that needs mental alertness until you know how this medicine affects you. To reduce dizzy or fainting spells, do not  sit or stand up quickly, especially if you are an older patient. Alcohol can increase drowsiness and dizziness. Avoid alcoholic drinks. Your mouth may get dry. Chewing sugarless gum or sucking hard candy, and drinking plenty of water will help. The use of this medicine may increase the chance of suicidal thoughts or actions. Pay special attention to how you are responding while on this medicine. Any worsening of mood, or thoughts of suicide or dying should be reported to your health care professional right away. Women who become pregnant while using this medicine may enroll in the [place] Antiepileptic Drug Pregnancy Registry by calling 1-888-233-2334. This registry collects information about the safety of antiepileptic drug use during pregnancy. What side effects may I notice from receiving this medicine? Side effects that you should report to your doctor or health care professional as soon as possible: -allergic reactions like skin rash, itching or hives, swelling of the face, lips, or tongue -worsening of mood, thoughts or actions of suicide or dying Side effects that usually do not require medical attention (report to your doctor or health care professional if they continue or are bothersome): -constipation -difficulty walking or controlling muscle movements -dizziness -nausea -slurred speech -tiredness -tremors -weight gain This list may not describe all possible side effects. Call your doctor for medical advice about side effects. You may report side effects to FDA at 1-800-FDA-1088. Where should I keep my medicine? Keep out of reach of children. This medicine may cause accidental overdose and death if it taken by other adults, children, or pets. Mix any unused medicine with a substance like cat litter or coffee grounds. Then throw the medicine away in a sealed container like a sealed bag or a coffee can with a lid. Do not use the medicine after the expiration date. Store at room  temperature between 15 and 30 degrees C (59 and 86 degrees F). NOTE: This sheet is a summary. It may not cover all possible information. If you have questions about this medicine, talk to your doctor, pharmacist, or health care provider.  2019 Elsevier/Gold Standard (2017-06-16 13:21:44)  

## 2018-05-25 NOTE — Progress Notes (Addendum)
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  The patient said that she is describing more burning pain to the back of her leg mostly at nighttime.  No significant swelling.  The pain is waking her up at night and she describes a hot sensation when it wakes her up.  She also has had orthotics ordered but she has not yet received them.  She is still been stretching daily.  Her granddaughter also has been rubbing her legs with over-the-counter pain cream. Denies any systemic complaints such as fevers, chills, nausea, vomiting. No acute changes since last appointment, and no other complaints at this time.   Objective: AAO x3, NAD DP/PT pulses palpable bilaterally, CRT less than 3 seconds There is mild escalation of the course of the Achilles tendon but the majority of her symptoms today she described more burning pain to the area that wakes her up at nighttime.  Thompson test was negative.  No edema, erythema to the Achilles tendon.  No area pinpoint tenderness identified Nails are hypertrophic, dystrophic with yellow-brown discoloration.  Subjectively there is tenderness nails 1-5 bilaterally.  No edema, erythema to the toenails. No open lesions or pre-ulcerative lesions.  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  -Nails debrided x10 without any complications or bleeding -In regards to the Achilles tendon pain I want her to continue stretching, icing daily.  Also due to the burning we discussed gabapentin.  She has been on this previously for unrelated issues.  After discussion she was to try this again.  We will  start gabapentin 100 mg at nighttime discussed side effects medication.  She lives with her granddaughter and daughter and they are to monitor her closely for any side effects.  She is to call me next week and see how she is doing and we may go up on the dose depending how she is tolerating the medicine.  RTC 3 months or sooner if needed  Trula Slade DPM

## 2018-06-01 ENCOUNTER — Telehealth: Payer: Self-pay | Admitting: Podiatry

## 2018-06-01 NOTE — Telephone Encounter (Signed)
Called and stated that she is doing well with Gabapentin, takes it at night but it is going well. No higher dose needed.

## 2018-06-09 DIAGNOSIS — R1310 Dysphagia, unspecified: Secondary | ICD-10-CM | POA: Diagnosis not present

## 2018-06-09 DIAGNOSIS — R269 Unspecified abnormalities of gait and mobility: Secondary | ICD-10-CM | POA: Diagnosis not present

## 2018-06-09 DIAGNOSIS — K219 Gastro-esophageal reflux disease without esophagitis: Secondary | ICD-10-CM | POA: Diagnosis not present

## 2018-06-09 DIAGNOSIS — M722 Plantar fascial fibromatosis: Secondary | ICD-10-CM | POA: Diagnosis not present

## 2018-06-15 DIAGNOSIS — M810 Age-related osteoporosis without current pathological fracture: Secondary | ICD-10-CM | POA: Diagnosis not present

## 2018-06-15 DIAGNOSIS — M2550 Pain in unspecified joint: Secondary | ICD-10-CM | POA: Diagnosis not present

## 2018-06-15 DIAGNOSIS — Z79899 Other long term (current) drug therapy: Secondary | ICD-10-CM | POA: Diagnosis not present

## 2018-06-15 DIAGNOSIS — M25551 Pain in right hip: Secondary | ICD-10-CM | POA: Diagnosis not present

## 2018-06-15 DIAGNOSIS — M7061 Trochanteric bursitis, right hip: Secondary | ICD-10-CM | POA: Diagnosis not present

## 2018-06-15 DIAGNOSIS — M1990 Unspecified osteoarthritis, unspecified site: Secondary | ICD-10-CM | POA: Diagnosis not present

## 2018-06-30 DIAGNOSIS — M810 Age-related osteoporosis without current pathological fracture: Secondary | ICD-10-CM | POA: Diagnosis not present

## 2018-06-30 DIAGNOSIS — K219 Gastro-esophageal reflux disease without esophagitis: Secondary | ICD-10-CM | POA: Diagnosis not present

## 2018-06-30 DIAGNOSIS — M160 Bilateral primary osteoarthritis of hip: Secondary | ICD-10-CM | POA: Diagnosis not present

## 2018-06-30 DIAGNOSIS — M722 Plantar fascial fibromatosis: Secondary | ICD-10-CM | POA: Diagnosis not present

## 2018-06-30 DIAGNOSIS — I10 Essential (primary) hypertension: Secondary | ICD-10-CM | POA: Diagnosis not present

## 2018-06-30 DIAGNOSIS — N390 Urinary tract infection, site not specified: Secondary | ICD-10-CM | POA: Diagnosis not present

## 2018-07-10 DIAGNOSIS — M722 Plantar fascial fibromatosis: Secondary | ICD-10-CM | POA: Diagnosis not present

## 2018-07-10 DIAGNOSIS — R269 Unspecified abnormalities of gait and mobility: Secondary | ICD-10-CM | POA: Diagnosis not present

## 2018-07-10 DIAGNOSIS — K219 Gastro-esophageal reflux disease without esophagitis: Secondary | ICD-10-CM | POA: Diagnosis not present

## 2018-07-10 DIAGNOSIS — R1310 Dysphagia, unspecified: Secondary | ICD-10-CM | POA: Diagnosis not present

## 2018-08-09 DIAGNOSIS — M722 Plantar fascial fibromatosis: Secondary | ICD-10-CM | POA: Diagnosis not present

## 2018-08-09 DIAGNOSIS — R269 Unspecified abnormalities of gait and mobility: Secondary | ICD-10-CM | POA: Diagnosis not present

## 2018-08-09 DIAGNOSIS — R1310 Dysphagia, unspecified: Secondary | ICD-10-CM | POA: Diagnosis not present

## 2018-08-09 DIAGNOSIS — K219 Gastro-esophageal reflux disease without esophagitis: Secondary | ICD-10-CM | POA: Diagnosis not present

## 2018-08-17 DIAGNOSIS — N390 Urinary tract infection, site not specified: Secondary | ICD-10-CM | POA: Diagnosis not present

## 2018-08-22 ENCOUNTER — Other Ambulatory Visit: Payer: Self-pay

## 2018-08-22 ENCOUNTER — Telehealth: Payer: Self-pay | Admitting: *Deleted

## 2018-08-22 ENCOUNTER — Ambulatory Visit (INDEPENDENT_AMBULATORY_CARE_PROVIDER_SITE_OTHER): Payer: Medicare HMO | Admitting: Podiatry

## 2018-08-22 ENCOUNTER — Encounter: Payer: Self-pay | Admitting: Podiatry

## 2018-08-22 VITALS — Temp 98.2°F

## 2018-08-22 DIAGNOSIS — B351 Tinea unguium: Secondary | ICD-10-CM

## 2018-08-22 DIAGNOSIS — M7661 Achilles tendinitis, right leg: Secondary | ICD-10-CM | POA: Diagnosis not present

## 2018-08-22 DIAGNOSIS — M79675 Pain in left toe(s): Secondary | ICD-10-CM

## 2018-08-22 DIAGNOSIS — M79661 Pain in right lower leg: Secondary | ICD-10-CM

## 2018-08-22 DIAGNOSIS — M79674 Pain in right toe(s): Secondary | ICD-10-CM

## 2018-08-22 DIAGNOSIS — M7989 Other specified soft tissue disorders: Secondary | ICD-10-CM

## 2018-08-22 DIAGNOSIS — M79662 Pain in left lower leg: Secondary | ICD-10-CM

## 2018-08-24 ENCOUNTER — Ambulatory Visit (HOSPITAL_COMMUNITY)
Admission: RE | Admit: 2018-08-24 | Discharge: 2018-08-24 | Disposition: A | Discharge status: Home / Self Care | Payer: Medicare HMO | Source: Ambulatory Visit | Attending: Internal Medicine | Admitting: Internal Medicine

## 2018-08-24 ENCOUNTER — Telehealth: Payer: Self-pay | Admitting: Podiatry

## 2018-08-24 ENCOUNTER — Other Ambulatory Visit: Payer: Self-pay

## 2018-08-24 DIAGNOSIS — M79662 Pain in left lower leg: Secondary | ICD-10-CM

## 2018-08-24 DIAGNOSIS — M79661 Pain in right lower leg: Secondary | ICD-10-CM | POA: Insufficient documentation

## 2018-08-24 NOTE — Telephone Encounter (Signed)
Thanks, please let the patient know.

## 2018-08-24 NOTE — Telephone Encounter (Signed)
Pt had test done for her right ankle and leg today and it was no signs of blood clots. She wanted Dr. Jacqualyn Posey to know.

## 2018-08-24 NOTE — Telephone Encounter (Signed)
Kayla Lloyd "THIS [place] MEDICAID MEMBER DOES NOT REQUIRE PRIOR AUTHORIZATION FOR OUTPATIENT RADIOLOGY THROUGH [place] OR [place] [facility] AT THIS TIME."  Orders for B/L lower extremities venous doppler [phone] faxed to [facility].

## 2018-08-25 DIAGNOSIS — H5200 Hypermetropia, unspecified eye: Secondary | ICD-10-CM | POA: Diagnosis not present

## 2018-08-28 ENCOUNTER — Telehealth: Payer: Self-pay | Admitting: *Deleted

## 2018-08-28 NOTE — Telephone Encounter (Signed)
-----   Message from Trula Slade, DPM sent at 08/28/2018  7:09 AM EDT ----- Negative for blood clot. Please let her know. Thanks.

## 2018-08-30 NOTE — Progress Notes (Signed)
Subjective: 85 year old female presents the office with her granddaughter for concerns of thick, painful, elongated toenails that she cannot trim her self.  Patient is describing some swelling and cramping to the back of the right calf.  No recent injury.  She is still doing exercises with tightness.  She has stopped gabapentin due to side effects.  This was causing her to become very lightheaded. Denies any systemic complaints such as fevers, chills, nausea, vomiting. No acute changes since last appointment, and no other complaints at this time.   Objective: AAO x3, NAD DP/PT pulses palpable bilaterally, CRT less than 3 seconds Nails are hypertrophic, dystrophic with yellow-brown discoloration.  Subjectively there is tenderness nails 1-5 bilaterally.  No edema, erythema to the toenails.  Mild swelling on the right lower extremity compared to left.  She describes tender sensation is mild no calf compression.  Calf is supple. No open lesions or pre-ulcerative lesions.  No pain with calf compression, swelling, warmth, erythema  Assessment: Symptomatic onychomycosis, neuropathy, right calf pain/swelling  Plan: -All treatment options discussed with the patient including all alternatives, risks, complications.  -Nails debrided x10 without any complications or bleeding -Order venous duplex to rule out DVT although low suspicion. -Continue stretching exercises for Achilles numbness.  Continue with icing as well and supportive shoes.  Return in about 3 months (around 11/22/2018).  Trula Slade DPM

## 2018-09-05 ENCOUNTER — Ambulatory Visit (INDEPENDENT_AMBULATORY_CARE_PROVIDER_SITE_OTHER): Payer: Medicare HMO | Admitting: Licensed Clinical Social Worker

## 2018-09-05 DIAGNOSIS — F419 Anxiety disorder, unspecified: Secondary | ICD-10-CM

## 2018-09-08 DIAGNOSIS — N9489 Other specified conditions associated with female genital organs and menstrual cycle: Secondary | ICD-10-CM | POA: Diagnosis not present

## 2018-09-08 DIAGNOSIS — K5900 Constipation, unspecified: Secondary | ICD-10-CM | POA: Diagnosis not present

## 2018-09-08 DIAGNOSIS — N952 Postmenopausal atrophic vaginitis: Secondary | ICD-10-CM | POA: Diagnosis not present

## 2018-09-08 DIAGNOSIS — N3946 Mixed incontinence: Secondary | ICD-10-CM | POA: Diagnosis not present

## 2018-09-08 DIAGNOSIS — N819 Female genital prolapse, unspecified: Secondary | ICD-10-CM | POA: Diagnosis not present

## 2018-09-09 DIAGNOSIS — K219 Gastro-esophageal reflux disease without esophagitis: Secondary | ICD-10-CM | POA: Diagnosis not present

## 2018-09-09 DIAGNOSIS — R1310 Dysphagia, unspecified: Secondary | ICD-10-CM | POA: Diagnosis not present

## 2018-09-09 DIAGNOSIS — M722 Plantar fascial fibromatosis: Secondary | ICD-10-CM | POA: Diagnosis not present

## 2018-09-09 DIAGNOSIS — R269 Unspecified abnormalities of gait and mobility: Secondary | ICD-10-CM | POA: Diagnosis not present

## 2018-09-19 DIAGNOSIS — H353133 Nonexudative age-related macular degeneration, bilateral, advanced atrophic without subfoveal involvement: Secondary | ICD-10-CM | POA: Diagnosis not present

## 2018-09-19 DIAGNOSIS — H04123 Dry eye syndrome of bilateral lacrimal glands: Secondary | ICD-10-CM | POA: Diagnosis not present

## 2018-09-19 DIAGNOSIS — H35373 Puckering of macula, bilateral: Secondary | ICD-10-CM | POA: Diagnosis not present

## 2018-09-20 ENCOUNTER — Telehealth: Payer: Self-pay | Admitting: Gastroenterology

## 2018-09-20 NOTE — Telephone Encounter (Signed)
Pt reported feeling pressure and pain in her abdomen.  She stated that she had a bowel obstruction in 1950 and is afraid of It happening again.

## 2018-09-20 NOTE — Telephone Encounter (Signed)
The pt states she has lower abd pressure and has very hard stools.  She takes some stool softener and "castor oil"  She says she has always had issues with constipation and also had a bowel obstruction in the 50's.  She is passing gas and stool (hard stool)- she does not have a fever or nausea.  She was scheduled to see Nevin Bloodgood on 9/2 and she also has an appt scheduled with PCP.  She will go to the ED if pain becomes severe.

## 2018-09-27 ENCOUNTER — Ambulatory Visit (INDEPENDENT_AMBULATORY_CARE_PROVIDER_SITE_OTHER): Payer: Medicare HMO | Admitting: Nurse Practitioner

## 2018-09-27 ENCOUNTER — Other Ambulatory Visit: Payer: Self-pay

## 2018-09-27 ENCOUNTER — Encounter: Payer: Self-pay | Admitting: Nurse Practitioner

## 2018-09-27 VITALS — BP 126/62 | HR 72 | Temp 98.0°F | Ht 59.0 in | Wt 127.0 lb

## 2018-09-27 DIAGNOSIS — K5909 Other constipation: Secondary | ICD-10-CM | POA: Diagnosis not present

## 2018-09-27 NOTE — Progress Notes (Signed)
Attending Physician's Attestation   I have reviewed the chart.   I agree with the Advanced Practitioner's note, impression, and recommendations with any updates as below.  If patient's dysphagia persists, would consider EGD with dilation.  If gas pains and discomfort persist although higher risk may consider role of diagnostic colonoscopy.  Otherwise if able to monitor and do well on prescribed therapies would be ideal to hold on repeat endoscopic evaluation if possible.  Justice Britain, MD [facility] Advanced Endoscopy Office # [id number]

## 2018-09-27 NOTE — Progress Notes (Signed)
Chief Complaint:    Constipation.   IMPRESSION and PLAN:     85 yo female with chronic constipation and left sided "gas" pains relieved with Gas-x and flatus.  -Just started daily fiber supplement, let's see how that works -Increase water to 6 glasses / day -Increase stool softener to 2 a day.  -Do not use MOM on a regular basis. Use only as a rescue option, preferable no more than once a week.  -low gas diet given -follow up prn  HPI:     Patient is a 85 yo female known to Dr. Rush Landmark. Her PMH is significant for HTN, hypothyroidism, melanoma, bowel obstruction, sleep apnea, cholecystectomy. She established care with [place] Sept 2019 for evaluation of dysphagia (chronic) and an abnormal esophagram with lodging of barium pill in distal esophagus. EGD performed, no gross esophageal lesions. Some white flaky material found there but biopsies negative . Gastric biopsies + for mild chronic gastritis without H.pylori.  Patient is here to get help with constipation. She takes a daily stool softener and variety of agents as needed including MOM, castor oil, dulcolax suppositories. Generally requires something for constipation 3-4 times a week. Just started fiber yesterday.   In addition to above she has been having left sided gas pain. Pain is sharp, alleviated with flatus. Gas-x helps too.   Her appetite is good, generally feels okay. She still has episodes of solid food dysphagia sometimes. Feels it is related to a fall on cement she had in the 1990's.   Review of systems:     No chest pain, no SOB, no fevers, no urinary sx   Past Medical History:  Diagnosis Date  . Bowel obstruction ([place]) 1950  . Cancer of septum of nose ([facility])    melanoma  . Hypertension   . Hypothyroidism     Patient's surgical history, family medical history, social history, medications and allergies were all reviewed in Epic    Current Outpatient Medications  Medication Sig Dispense Refill  .  ALPRAZolam (XANAX) 0.25 MG tablet Take 0.25 mg by mouth 3 (three) times daily.     . BC FAST PAIN RELIEF 650-195-33.3 MG PACK Take 1 packet by mouth daily as needed (for pain.).    Marland Kitchen bisacodyl (DULCOLAX) 5 MG EC tablet Take 5 mg by mouth daily as needed (for constipation.).     Marland Kitchen castor oil liquid Take 15 mLs by mouth daily as needed for moderate constipation.    . cetirizine (ZYRTEC) 10 MG tablet Take 10 mg by mouth daily.     . Cholecalciferol (VITAMIN D3) 50 MCG (2000 UT) TABS Take 2,000 Units by mouth daily.    Marland Kitchen conjugated estrogens (PREMARIN) vaginal cream Place 1 Applicatorful vaginally daily. Place 1/2 applicator vaginally twice a week 42.5 g 3  . docusate sodium (DULCOLAX) 100 MG capsule Take 100 mg by mouth 2 (two) times daily as needed (for constipation).     Marland Kitchen esomeprazole (NEXIUM) 40 MG capsule Take 1 capsule (40 mg total) by mouth 2 (two) times daily. 60 capsule 6  . esomeprazole (NEXIUM) 40 MG capsule Take 1 capsule (40 mg total) by mouth 2 (two) times daily. 60 capsule 3  . gabapentin (NEURONTIN) 100 MG capsule Take 1 capsule (100 mg total) by mouth at bedtime. 90 capsule 0  . Ginkgo Biloba 120 MG CAPS Take 120 mg by mouth daily.    . Lactobacillus (PROBIOTIC ACIDOPHILUS PO) Take 1 capsule by mouth daily.    Marland Kitchen  levothyroxine (SYNTHROID, LEVOTHROID) 75 MCG tablet Take 75 mcg daily before breakfast by mouth.    . Liniments (SALONPAS PAIN RELIEF PATCH EX) Place 1 patch onto the skin daily as needed (for shoulder pain.).    Marland Kitchen loperamide (IMODIUM) 2 MG capsule Take by mouth as needed for diarrhea or loose stools.    Marland Kitchen losartan (COZAAR) 100 MG tablet Take 100 mg by mouth daily.  5  . Magnesium 500 MG TABS Take 500 mg by mouth 3 (three) times a week.    . Menthol, Topical Analgesic, 154 MG PADS Place 1 each onto the skin daily as needed (for hip pain.).    Marland Kitchen phenazopyridine (PYRIDIUM) 200 MG tablet Take 1 tablet (200 mg total) by mouth 3 (three) times daily as needed for pain (urethral  spasm). 10 tablet 1  . PROLIA 60 MG/ML SOSY injection Take 60 mg by mouth every 6 (six) months.  0  . Pumpkin Seed-Soy Germ (AZO BLADDER CONTROL/GO-LESS PO) Take 1 tablet by mouth daily as needed (for bladder pain.).    Marland Kitchen simvastatin (ZOCOR) 40 MG tablet Take 40 mg daily by mouth.    . traMADol (ULTRAM) 50 MG tablet Take 50 mg by mouth every 6 (six) hours as needed (pain).    Marland Kitchen trolamine salicylate (ASPERCREME) 10 % cream Apply 1 application topically 3 (three) times daily as needed for muscle pain.    . vitamin C (ASCORBIC ACID) 500 MG tablet Take 500 mg by mouth daily.     No current facility-administered medications for this visit.     Physical Exam:     BP 126/62   Pulse 72   Temp 98 F (36.7 C)   Ht 4\' 11"  (1.499 m)   Wt 127 lb (57.6 kg)   BMI 25.65 kg/m   GENERAL:  Pleasant female in a wheelchair in NAD PSYCH: : Cooperative, normal affect EENT:  conjunctiva pink, mucous membranes moist, neck supple without masses CARDIAC:  RRR,  no peripheral edema PULM: Normal respiratory effort, lungs CTA bilaterally, no wheezing ABDOMEN:  Nondistended, soft, nontender.Limited exam in w/c. Normal bowel sounds SKIN:  turgor, no lesions seen NEURO: Alert and oriented x 3, no focal neurologic deficits   Tye Savoy , NP 09/27/2018, 1:42 PM

## 2018-09-27 NOTE — Patient Instructions (Signed)
If you are age 83 or older, your body mass index should be between 23-30. Your Body mass index is 25.65 kg/m. If this is out of the aforementioned range listed, please consider follow up with your Primary Care Provider.  If you are age 20 or younger, your body mass index should be between 19-25. Your Body mass index is 25.65 kg/m. If this is out of the aformentioned range listed, please consider follow up with your Primary Care Provider.   You have been given a Low Gas Diet.  Use Milk of Magnesia only when necessary.  Do not use more than once a week.  Continue Fiber in 8 ounces of water daily.  INCREASE water to 6 glasses daily.  Increase stool softener to 2 a day.  Call in three weeks if not better.  Thank you for choosing me and [facility].   Tye Savoy, NP

## 2018-09-28 ENCOUNTER — Ambulatory Visit: Payer: Medicare HMO | Admitting: Licensed Clinical Social Worker

## 2018-10-10 DIAGNOSIS — M722 Plantar fascial fibromatosis: Secondary | ICD-10-CM | POA: Diagnosis not present

## 2018-10-10 DIAGNOSIS — H04123 Dry eye syndrome of bilateral lacrimal glands: Secondary | ICD-10-CM | POA: Diagnosis not present

## 2018-10-10 DIAGNOSIS — R269 Unspecified abnormalities of gait and mobility: Secondary | ICD-10-CM | POA: Diagnosis not present

## 2018-10-10 DIAGNOSIS — K219 Gastro-esophageal reflux disease without esophagitis: Secondary | ICD-10-CM | POA: Diagnosis not present

## 2018-10-10 DIAGNOSIS — R1310 Dysphagia, unspecified: Secondary | ICD-10-CM | POA: Diagnosis not present

## 2018-10-18 ENCOUNTER — Other Ambulatory Visit: Payer: Self-pay

## 2018-10-18 ENCOUNTER — Encounter: Payer: Self-pay | Admitting: Family Medicine

## 2018-10-18 ENCOUNTER — Ambulatory Visit (INDEPENDENT_AMBULATORY_CARE_PROVIDER_SITE_OTHER): Payer: Medicare HMO | Admitting: Family Medicine

## 2018-10-18 VITALS — BP 130/71 | HR 83 | Temp 98.5°F | Ht 59.0 in | Wt 126.8 lb

## 2018-10-18 DIAGNOSIS — I10 Essential (primary) hypertension: Secondary | ICD-10-CM

## 2018-10-18 DIAGNOSIS — E782 Mixed hyperlipidemia: Secondary | ICD-10-CM | POA: Diagnosis not present

## 2018-10-18 DIAGNOSIS — E039 Hypothyroidism, unspecified: Secondary | ICD-10-CM | POA: Diagnosis not present

## 2018-10-18 DIAGNOSIS — Z760 Encounter for issue of repeat prescription: Secondary | ICD-10-CM | POA: Diagnosis not present

## 2018-10-18 NOTE — Progress Notes (Signed)
 New Patient Office Visit  Subjective:  Patient ID: Kayla Lloyd, female    DOB: 01/20/1924  Age: 85 y.o. MRN: [id number]  CC:  Chief Complaint  Patient presents with  . Establish Care  . tramadol    wants to talk about it    HPI Kayla Lloyd presents for   Enc for Med refill She picked up a new tramadol and the medication was a different pill, color and shape She states that she developed a sharp umbilical pain after taking the tramadol She also takes alprazolam tid  She takes her medications in a pill box  Hypertension: Patient here for follow-up of elevated blood pressure. She is not exercising and is adherent to low salt diet.  Blood pressure is well controlled at home. Cardiac symptoms none. Patient denies chest pain, claudication, dyspnea, fatigue, lower extremity edema and orthopnea.  Cardiovascular risk factors: hypertension. Use of agents associated with hypertension: none. History of target organ damage: none.  BP Readings from Last 3 Encounters:  10/18/18 130/71  09/27/18 126/62  02/09/18 108/64     She states that she picked up the tramadol 50mg this morning She reports that at times she feels like her bowels lock up on her She has a history of bowel obstruction   Thyroid: Patient presents for evaluation of hypothyroidism. Current symptoms include denies fatigue, weight changes, heat/cold intolerance, bowel/skin changes or CVS symptoms.  Lab Results  Component Value Date   TSH 1.680 10/18/2018      Past Medical History:  Diagnosis Date  . Bowel obstruction (HCC) 1950  . Cancer of septum of nose (HCC)    melanoma  . Hypertension   . Hypothyroidism     Past Surgical History:  Procedure Laterality Date  . BIOPSY  12/07/2017   Procedure: BIOPSY;  Surgeon: Mansouraty, Gabriel Jr., MD;  Location: [facility];  Service: Gastroenterology;;  . CHOLECYSTECTOMY    . ESOPHAGOGASTRODUODENOSCOPY (EGD) WITH PROPOFOL N/A 12/07/2017   Procedure: ESOPHAGOGASTRODUODENOSCOPY (EGD) WITH PROPOFOL;  Surgeon: Mansouraty, Gabriel Jr., MD;  Location: [facility];  Service: Gastroenterology;  Laterality: N/A;  May need Dilation  . hysterecrtomy    . left hip surgery    . ROTATOR CUFF REPAIR Right   . SMALL INTESTINE SURGERY      Family History  Problem Relation Age of Onset  . Uterine cancer Mother   . Hypertension Father   . Colon cancer Neg Hx   . Esophageal cancer Neg Hx   . Liver disease Neg Hx   . Inflammatory bowel disease Neg Hx   . Pancreatic cancer Neg Hx   . Rectal cancer Neg Hx   . Stomach cancer Neg Hx     Social History   Socioeconomic History  . Marital status: Widowed    Spouse name: Not on file  . Number of children: 8  . Years of education: Not on file  . Highest education level: Not on file  Occupational History  . Not on file  Social Needs  . Financial resource strain: Not on file  . Food insecurity    Worry: Not on file    Inability: Not on file  . Transportation needs    Medical: Not on file    Non-medical: Not on file  Tobacco Use  . Smoking status: Never Smoker  . Smokeless tobacco: Never Used  Substance and Sexual Activity  . Alcohol use: No    Frequency: Never    Comment: wine/rare  . Drug   use: No  . Sexual activity: Not Currently  Lifestyle  . Physical activity    Days per week: Not on file    Minutes per session: Not on file  . Stress: Not on file  Relationships  . Social connections    Talks on phone: Not on file    Gets together: Not on file    Attends religious service: Not on file    Active member of club or organization: Not on file    Attends meetings of clubs or organizations: Not on file    Relationship status: Not on file  . Intimate partner violence    Fear of current or ex partner: Not on file    Emotionally abused: Not on file    Physically abused: Not on file    Forced sexual activity: Not on file  Other Topics Concern  . Not on file  Social History  Narrative  . Not on file    ROS Review of Systems Review of Systems  Constitutional: Negative for activity change, appetite change, chills and fever.  HENT: Negative for congestion, nosebleeds, trouble swallowing and voice change.   Respiratory: Negative for cough, shortness of breath and wheezing.   Gastrointestinal: Negative for diarrhea, nausea and vomiting.  Genitourinary: Negative for difficulty urinating, dysuria, flank pain and hematuria.  Musculoskeletal: Negative for back pain, joint swelling and neck pain.  Neurological: Negative for dizziness, speech difficulty, light-headedness and numbness.  See HPI. All other review of systems negative.   Objective:   Today's Vitals: BP 130/71 (BP Location: Right Arm, Patient Position: Sitting, Cuff Size: Normal)   Pulse 83   Temp 98.5 F (36.9 C) (Oral)   Ht 4' 11" (1.499 m)   Wt 126 lb 12.8 oz (57.5 kg)   SpO2 95%   BMI 25.61 kg/m   Physical Exam Physical Exam  Constitutional: Oriented to person, place, and time. Appears well-developed and well-nourished.  HENT:  Head: Normocephalic and atraumatic.  Eyes: Conjunctivae and EOM are normal.  Cardiovascular: Normal rate, regular rhythm, normal heart sounds and intact distal pulses.  No murmur heard. Pulmonary/Chest: Effort normal and breath sounds normal. No stridor. No respiratory distress. Has no wheezes.  Abdomen: soft, nontender, no rebound, no masses Neurological: Is alert and oriented to person, place, and time.  Skin: Skin is warm. Capillary refill takes less than 2 seconds.  Psychiatric: Has a normal mood and affect. Behavior is normal. Judgment and thought content normal.   Assessment & Plan:   Problem List Items Addressed This Visit      Cardiovascular and Mediastinum   Essential hypertension - Primary Discussed med monitoring, no symptoms of hypotension   Relevant Orders   CMP14+EGFR (Completed)     Endocrine   Thyroid activity decreased - continue to  monitor    Relevant Orders   TSH (Completed)     Other   Hyperlipidemia - stable, CPM   Relevant Orders   CMP14+EGFR (Completed)   Lipid panel (Completed)      Outpatient Encounter Medications as of 10/18/2018  Medication Sig  . ALPRAZolam (XANAX) 0.25 MG tablet Take 0.25 mg by mouth 3 (three) times daily.   . BC FAST PAIN RELIEF 650-195-33.3 MG PACK Take 1 packet by mouth daily as needed (for pain.).  . bisacodyl (DULCOLAX) 5 MG EC tablet Take 5 mg by mouth daily as needed (for constipation.).   . castor oil liquid Take 15 mLs by mouth daily as needed for moderate constipation.  . cetirizine (  ZYRTEC) 10 MG tablet Take 10 mg by mouth daily.   . Cholecalciferol (VITAMIN D3) 50 MCG (2000 UT) TABS Take 2,000 Units by mouth daily.  . conjugated estrogens (PREMARIN) vaginal cream Place 1 Applicatorful vaginally daily. Place 1/2 applicator vaginally twice a week  . docusate sodium (DULCOLAX) 100 MG capsule Take 100 mg by mouth 2 (two) times daily as needed (for constipation).   . esomeprazole (NEXIUM) 40 MG capsule Take 1 capsule (40 mg total) by mouth 2 (two) times daily.  . Ginkgo Biloba 120 MG CAPS Take 120 mg by mouth daily.  . Lactobacillus (PROBIOTIC ACIDOPHILUS PO) Take 1 capsule by mouth daily.  . levothyroxine (SYNTHROID, LEVOTHROID) 75 MCG tablet Take 75 mcg daily before breakfast by mouth.  . Liniments (SALONPAS PAIN RELIEF PATCH EX) Place 1 patch onto the skin daily as needed (for shoulder pain.).  . loperamide (IMODIUM) 2 MG capsule Take by mouth as needed for diarrhea or loose stools.  . losartan (COZAAR) 100 MG tablet Take 100 mg by mouth daily.  . Magnesium 500 MG TABS Take 500 mg by mouth 3 (three) times a week.  . phenazopyridine (PYRIDIUM) 200 MG tablet Take 1 tablet (200 mg total) by mouth 3 (three) times daily as needed for pain (urethral spasm).  . PROLIA 60 MG/ML SOSY injection Take 60 mg by mouth every 6 (six) months.  . Pumpkin Seed-Soy Germ (AZO BLADDER  CONTROL/GO-LESS PO) Take 1 tablet by mouth daily as needed (for bladder pain.).  . simvastatin (ZOCOR) 40 MG tablet Take 40 mg daily by mouth.  . traMADol (ULTRAM) 50 MG tablet Take 50 mg by mouth every 6 (six) hours as needed (pain).  . trolamine salicylate (ASPERCREME) 10 % cream Apply 1 application topically 3 (three) times daily as needed for muscle pain.  . vitamin C (ASCORBIC ACID) 500 MG tablet Take 500 mg by mouth daily.  . Menthol, Topical Analgesic, 154 MG PADS Place 1 each onto the skin daily as needed (for hip pain.).  . [DISCONTINUED] esomeprazole (NEXIUM) 40 MG capsule Take 1 capsule (40 mg total) by mouth 2 (two) times daily.  . [DISCONTINUED] gabapentin (NEURONTIN) 100 MG capsule Take 1 capsule (100 mg total) by mouth at bedtime. (Patient not taking: Reported on 10/18/2018)   No facility-administered encounter medications on file as of 10/18/2018.     Follow-up: No follow-ups on file.   Zoe A Stallings, MD 

## 2018-10-18 NOTE — Patient Instructions (Signed)
° ° ° °  If you have lab work done today you will be contacted with your lab results within the next 2 weeks.  If you have not heard from us then please contact us. The fastest way to get your results is to register for My Chart. ° ° °IF you received an x-ray today, you will receive an invoice from [facility]. Please contact [facility] at [phone] with questions or concerns regarding your invoice.  ° °IF you received labwork today, you will receive an invoice from [facility]. Please contact [facility] at [phone] with questions or concerns regarding your invoice.  ° °Our billing staff will not be able to assist you with questions regarding bills from these companies. ° °You will be contacted with the lab results as soon as they are available. The fastest way to get your results is to activate your My Chart account. Instructions are located on the last page of this paperwork. If you have not heard from us regarding the results in 2 weeks, please contact this office. °  ° ° ° °

## 2018-10-19 LAB — CMP14+EGFR
ALT: 32 IU/L (ref 0–32)
AST: 17 IU/L (ref 0–40)
Albumin/Globulin Ratio: 1.7 (ref 1.2–2.2)
Albumin: 4.7 g/dL — ABNORMAL HIGH (ref 3.5–4.6)
Alkaline Phosphatase: 88 IU/L (ref 39–117)
BUN/Creatinine Ratio: 16 (ref 12–28)
BUN: 16 mg/dL (ref 10–36)
Bilirubin Total: 0.7 mg/dL (ref 0.0–1.2)
CO2: 24 mmol/L (ref 20–29)
Calcium: 9.7 mg/dL (ref 8.7–10.3)
Chloride: 101 mmol/L (ref 96–106)
Creatinine, Ser: 1.03 mg/dL — ABNORMAL HIGH (ref 0.57–1.00)
GFR calc Af Amer: 55 mL/min/{1.73_m2} — ABNORMAL LOW (ref >59)
GFR calc non Af Amer: 47 mL/min/{1.73_m2} — ABNORMAL LOW (ref >59)
Globulin, Total: 2.7 g/dL (ref 1.5–4.5)
Glucose: 78 mg/dL (ref 65–99)
Potassium: 4.6 mmol/L (ref 3.5–5.2)
Sodium: 138 mmol/L (ref 134–144)
Total Protein: 7.4 g/dL (ref 6.0–8.5)

## 2018-10-19 LAB — LIPID PANEL
Chol/HDL Ratio: 3.9 ratio (ref 0.0–4.4)
Cholesterol, Total: 189 mg/dL (ref 100–199)
HDL: 49 mg/dL (ref >39)
LDL Chol Calc (NIH): 129 mg/dL — ABNORMAL HIGH (ref 0–99)
Triglycerides: 60 mg/dL (ref 0–149)
VLDL Cholesterol Cal: 11 mg/dL (ref 5–40)

## 2018-10-19 LAB — TSH: TSH: 1.68 u[IU]/mL (ref 0.450–4.500)

## 2018-11-09 DIAGNOSIS — M810 Age-related osteoporosis without current pathological fracture: Secondary | ICD-10-CM | POA: Diagnosis not present

## 2018-11-09 DIAGNOSIS — K219 Gastro-esophageal reflux disease without esophagitis: Secondary | ICD-10-CM | POA: Diagnosis not present

## 2018-11-09 DIAGNOSIS — M722 Plantar fascial fibromatosis: Secondary | ICD-10-CM | POA: Diagnosis not present

## 2018-11-09 DIAGNOSIS — R269 Unspecified abnormalities of gait and mobility: Secondary | ICD-10-CM | POA: Diagnosis not present

## 2018-11-09 DIAGNOSIS — R1310 Dysphagia, unspecified: Secondary | ICD-10-CM | POA: Diagnosis not present

## 2018-11-21 DIAGNOSIS — H04123 Dry eye syndrome of bilateral lacrimal glands: Secondary | ICD-10-CM | POA: Diagnosis not present

## 2018-11-22 ENCOUNTER — Ambulatory Visit (INDEPENDENT_AMBULATORY_CARE_PROVIDER_SITE_OTHER): Payer: Medicare HMO | Admitting: Podiatry

## 2018-11-22 ENCOUNTER — Encounter: Payer: Self-pay | Admitting: Podiatry

## 2018-11-22 ENCOUNTER — Other Ambulatory Visit: Payer: Self-pay

## 2018-11-22 DIAGNOSIS — M79674 Pain in right toe(s): Secondary | ICD-10-CM | POA: Diagnosis not present

## 2018-11-22 DIAGNOSIS — B351 Tinea unguium: Secondary | ICD-10-CM

## 2018-11-22 DIAGNOSIS — M79675 Pain in left toe(s): Secondary | ICD-10-CM

## 2018-11-22 NOTE — Progress Notes (Signed)
Subjective: Kayla Lloyd is seen today for follow up painful, elongated, thickened toenails 1-5 b/l feet that she cannot cut. Pain interferes with daily activities. Aggravating factor includes wearing enclosed shoe gear and relieved with periodic debridement.  She is accompanied by her granddaughter on today's visit. She voices no new pedal problems on today's visit.  Current Outpatient Medications on File Prior to Visit  Medication Sig  . ALPRAZolam (XANAX) 0.25 MG tablet Take 0.25 mg by mouth 3 (three) times daily.   . BC FAST PAIN RELIEF 650-195-33.3 MG PACK Take 1 packet by mouth daily as needed (for pain.).  Marland Kitchen bisacodyl (DULCOLAX) 5 MG EC tablet Take 5 mg by mouth daily as needed (for constipation.).   Marland Kitchen castor oil liquid Take 15 mLs by mouth daily as needed for moderate constipation.  . CEQUA 0.09 % SOLN Apply 1 drop to eye 2 (two) times daily.  . cetirizine (ZYRTEC) 10 MG tablet Take 10 mg by mouth daily.   . Cholecalciferol (VITAMIN D3) 50 MCG (2000 UT) TABS Take 2,000 Units by mouth daily.  Marland Kitchen conjugated estrogens (PREMARIN) vaginal cream Place 1 Applicatorful vaginally daily. Place 1/2 applicator vaginally twice a week  . docusate sodium (DULCOLAX) 100 MG capsule Take 100 mg by mouth 2 (two) times daily as needed (for constipation).   Marland Kitchen esomeprazole (NEXIUM) 40 MG capsule Take 1 capsule (40 mg total) by mouth 2 (two) times daily.  . Ginkgo Biloba 120 MG CAPS Take 120 mg by mouth daily.  . Lactobacillus (PROBIOTIC ACIDOPHILUS PO) Take 1 capsule by mouth daily.  Marland Kitchen levothyroxine (SYNTHROID, LEVOTHROID) 75 MCG tablet Take 75 mcg daily before breakfast by mouth.  . Liniments (SALONPAS PAIN RELIEF PATCH EX) Place 1 patch onto the skin daily as needed (for shoulder pain.).  Marland Kitchen loperamide (IMODIUM) 2 MG capsule Take by mouth as needed for diarrhea or loose stools.  Marland Kitchen losartan (COZAAR) 100 MG tablet Take 100 mg by mouth daily.  . Magnesium 500 MG TABS Take 500 mg by mouth 3 (three)  times a week.  . Menthol, Topical Analgesic, 154 MG PADS Place 1 each onto the skin daily as needed (for hip pain.).  Marland Kitchen pantoprazole (PROTONIX) 40 MG tablet   . phenazopyridine (PYRIDIUM) 200 MG tablet Take 1 tablet (200 mg total) by mouth 3 (three) times daily as needed for pain (urethral spasm).  Marland Kitchen PROLIA 60 MG/ML SOSY injection Take 60 mg by mouth every 6 (six) months.  . Pumpkin Seed-Soy Germ (AZO BLADDER CONTROL/GO-LESS PO) Take 1 tablet by mouth daily as needed (for bladder pain.).  Marland Kitchen RESTASIS 0.05 % ophthalmic emulsion   . simvastatin (ZOCOR) 40 MG tablet Take 40 mg daily by mouth.  . traMADol (ULTRAM) 50 MG tablet Take 50 mg by mouth every 6 (six) hours as needed (pain).  Marland Kitchen trolamine salicylate (ASPERCREME) 10 % cream Apply 1 application topically 3 (three) times daily as needed for muscle pain.  . vitamin C (ASCORBIC ACID) 500 MG tablet Take 500 mg by mouth daily.   No current facility-administered medications on file prior to visit.      Allergies  Allergen Reactions  . Brimonidine   . Codeine Other (See Comments)  . Levofloxacin Other (See Comments)  . Lidocaine   . Meloxicam   . Oxycodone   . Pregabalin   . Sulfa Antibiotics Other (See Comments)  . Timolol Maleate   . Zolpidem      Objective:  Vascular Examination: Capillary refill time <3 seconds b/l.  Dorsalis pedis present b/l.  Posterior tibial pulses present b/l.  Digital hair absent b/l.  Skin temperature gradient WNL b/l.   Venous insufficiency b/l.  Dermatological Examination: Skin thin, shiny and atrophic b/l.  Toenails 1-5 b/l discolored, thick, dystrophic with subungual debris and pain with palpation to nailbeds due to thickness of nails.  Musculoskeletal: Muscle strength 5/5 to all LE muscle groups b/l.  No gross bony deformities b/l.  No pain, crepitus or joint limitation noted with ROM.   Neurological Examination: Protective sensation intact 5/5 with 10 gram monofilament  bilaterally.  Assessment: Painful onychomycosis toenails 1-5 b/l   Plan: 1. Toenails 1-5 b/l were debrided in length and girth without iatrogenic bleeding. 2. Patient to continue soft, supportive shoe gear 3. Patient to report any pedal injuries to medical professional immediately. 4. Follow up 3 months.  5. Patient/POA to call should there be a concern in the interim.

## 2018-12-01 DIAGNOSIS — Z0100 Encounter for examination of eyes and vision without abnormal findings: Secondary | ICD-10-CM | POA: Diagnosis not present

## 2018-12-10 DIAGNOSIS — R269 Unspecified abnormalities of gait and mobility: Secondary | ICD-10-CM | POA: Diagnosis not present

## 2018-12-10 DIAGNOSIS — R1310 Dysphagia, unspecified: Secondary | ICD-10-CM | POA: Diagnosis not present

## 2018-12-10 DIAGNOSIS — M722 Plantar fascial fibromatosis: Secondary | ICD-10-CM | POA: Diagnosis not present

## 2018-12-10 DIAGNOSIS — K219 Gastro-esophageal reflux disease without esophagitis: Secondary | ICD-10-CM | POA: Diagnosis not present

## 2018-12-12 DIAGNOSIS — R8290 Unspecified abnormal findings in urine: Secondary | ICD-10-CM | POA: Diagnosis not present

## 2018-12-12 DIAGNOSIS — N952 Postmenopausal atrophic vaginitis: Secondary | ICD-10-CM | POA: Diagnosis not present

## 2018-12-12 DIAGNOSIS — N3946 Mixed incontinence: Secondary | ICD-10-CM | POA: Diagnosis not present

## 2018-12-12 DIAGNOSIS — R82998 Other abnormal findings in urine: Secondary | ICD-10-CM | POA: Diagnosis not present

## 2018-12-12 DIAGNOSIS — N9489 Other specified conditions associated with female genital organs and menstrual cycle: Secondary | ICD-10-CM | POA: Diagnosis not present

## 2018-12-18 DIAGNOSIS — M7061 Trochanteric bursitis, right hip: Secondary | ICD-10-CM | POA: Diagnosis not present

## 2018-12-18 DIAGNOSIS — M810 Age-related osteoporosis without current pathological fracture: Secondary | ICD-10-CM | POA: Diagnosis not present

## 2018-12-18 DIAGNOSIS — M2550 Pain in unspecified joint: Secondary | ICD-10-CM | POA: Diagnosis not present

## 2018-12-18 DIAGNOSIS — M25551 Pain in right hip: Secondary | ICD-10-CM | POA: Diagnosis not present

## 2018-12-18 DIAGNOSIS — Z79899 Other long term (current) drug therapy: Secondary | ICD-10-CM | POA: Diagnosis not present

## 2018-12-18 DIAGNOSIS — M1990 Unspecified osteoarthritis, unspecified site: Secondary | ICD-10-CM | POA: Diagnosis not present

## 2019-01-03 ENCOUNTER — Telehealth: Payer: Self-pay | Admitting: Family Medicine

## 2019-01-03 ENCOUNTER — Other Ambulatory Visit: Payer: Self-pay

## 2019-01-03 ENCOUNTER — Other Ambulatory Visit: Payer: Self-pay | Admitting: Family Medicine

## 2019-01-03 DIAGNOSIS — J302 Other seasonal allergic rhinitis: Secondary | ICD-10-CM

## 2019-01-03 NOTE — Telephone Encounter (Signed)
Medication Refill - Medication: simvastatin (ZOCOR) 40 MG tablet  Patient is completely out of medication  Preferred Pharmacy (with phone number or street name):  [facility] - [place], [place] - [address] [phone] (Phone) [phone] (Fax)     Agent: Please be advised that RX refills may take up to 3 business days. We ask that you follow-up with your pharmacy.

## 2019-01-03 NOTE — Progress Notes (Signed)
Patient is requesting a refill of the following medications: Requested Prescriptions   Pending Prescriptions Disp Refills  . ALPRAZolam (XANAX) 0.25 MG tablet 30 tablet     Sig: Take 1 tablet (0.25 mg total) by mouth 3 (three) times daily.  . cetirizine (ZYRTEC) 10 MG tablet       Sig: Take 1 tablet (10 mg total) by mouth daily.    Date of patient request: 01/03/2019 Last office visit:10/18/2018 Date of last refill: 09/30/16 alprazolam and 11/30/16 cetirizine Last refill amount: no quantity noted Follow up time period per chart: N/a

## 2019-01-03 NOTE — Telephone Encounter (Signed)
Requested medication (s) are due for refill today: yes  Requested medication (s) are on the active medication list: yes  Last refill:  11/30/2016  Future visit scheduled: yes  Notes to clinic: last filled by historical provider    Requested Prescriptions  Pending Prescriptions Disp Refills   simvastatin (ZOCOR) 40 MG tablet 30 tablet     Sig: Take 1 tablet (40 mg total) by mouth daily.     Cardiovascular:  Antilipid - Statins Failed - 01/03/2019  2:57 PM      Failed - LDL in normal range and within 360 days    LDL Chol Calc (NIH)  Date Value Ref Range Status  10/18/2018 129 (H) 0 - 99 mg/dL Final         Passed - Total Cholesterol in normal range and within 360 days    Cholesterol, Total  Date Value Ref Range Status  10/18/2018 189 100 - 199 mg/dL Final         Passed - HDL in normal range and within 360 days    HDL  Date Value Ref Range Status  10/18/2018 49 >39 mg/dL Final         Passed - Triglycerides in normal range and within 360 days    Triglycerides  Date Value Ref Range Status  10/18/2018 60 0 - 149 mg/dL Final         Passed - Patient is not pregnant      Passed - Valid encounter within last 12 months    Recent Outpatient Visits          2 months ago Essential hypertension   [facility] at [place], MD      Future Appointments            In 9 months Forrest Moron, MD Primary [facility] at [place], [facility]

## 2019-01-03 NOTE — Telephone Encounter (Signed)
Patient requesting ALPRAZolam (XANAX) 0.25 MG tablet , informed please allow 48 to 72 hour turn around, patient states she didn't realize she was completely out, please advise

## 2019-01-04 ENCOUNTER — Other Ambulatory Visit: Payer: Self-pay | Admitting: Family Medicine

## 2019-01-04 MED ORDER — CETIRIZINE HCL 10 MG PO TABS: 10.0000 mg | ORAL_TABLET | Freq: Every day | ORAL | 3 refills | Status: DC

## 2019-01-04 MED ORDER — SIMVASTATIN 40 MG PO TABS: 40.0000 mg | ORAL_TABLET | Freq: Every day | ORAL | 0 refills | Status: DC

## 2019-01-04 NOTE — Telephone Encounter (Signed)
Please advise  Patient is requesting a refill of the following medications: Requested Prescriptions    No prescriptions requested or ordered in this encounter    Date of patient request: 01/04/19 Last office visit: 10/18/18 Date of last refill: 09/30/16 Last refill amount:  Follow up time period per chart: 10/19/19

## 2019-01-04 NOTE — Telephone Encounter (Signed)
Patient has been notified

## 2019-01-04 NOTE — Telephone Encounter (Signed)
Requested medication (s) are due for refill today: yes  Requested medication (s) are on the active medication list: yes  Last refill: 11/28/18  Future visit scheduled:yes  Notes to clinic:  refill cannot be delegated    Requested Prescriptions  Pending Prescriptions Disp Refills   ALPRAZolam (XANAX) 0.25 MG tablet [Pharmacy Med Name: alprazolam 0.25 mg tablet] 90 tablet 1    Sig: TAKE 1 TABLET BY MOUTH 3 TIMES DAILY      Not Delegated - Psychiatry:  Anxiolytics/Hypnotics Failed - 01/04/2019  9:18 AM      Failed - This refill cannot be delegated      Failed - Urine Drug Screen completed in last 360 days.      Passed - Valid encounter within last 6 months    Recent Outpatient Visits           2 months ago Essential hypertension   [facility] at [place], MD       Future Appointments             In 9 months Kayla Moron, MD Primary Care at [place], [facility]

## 2019-01-04 NOTE — Telephone Encounter (Signed)
Refill sent in. Please notify the patient.

## 2019-01-05 DIAGNOSIS — N952 Postmenopausal atrophic vaginitis: Secondary | ICD-10-CM | POA: Diagnosis not present

## 2019-01-09 ENCOUNTER — Telehealth: Payer: Self-pay

## 2019-01-09 DIAGNOSIS — M722 Plantar fascial fibromatosis: Secondary | ICD-10-CM | POA: Diagnosis not present

## 2019-01-09 DIAGNOSIS — R269 Unspecified abnormalities of gait and mobility: Secondary | ICD-10-CM | POA: Diagnosis not present

## 2019-01-09 DIAGNOSIS — R1310 Dysphagia, unspecified: Secondary | ICD-10-CM | POA: Diagnosis not present

## 2019-01-09 DIAGNOSIS — K219 Gastro-esophageal reflux disease without esophagitis: Secondary | ICD-10-CM | POA: Diagnosis not present

## 2019-01-09 NOTE — Telephone Encounter (Signed)
Please advise 

## 2019-01-09 NOTE — Telephone Encounter (Signed)
[facility] has been calling the pt. Requesting the pt. Let Dr. Nolon Rod know they need verbal conformation to help fill simvastatin and pantoprazole. They requested the pt. Have the Dr. call [phone].

## 2019-01-22 ENCOUNTER — Encounter: Payer: Self-pay | Admitting: Family Medicine

## 2019-01-31 ENCOUNTER — Other Ambulatory Visit: Payer: Self-pay | Admitting: Family Medicine

## 2019-01-31 NOTE — Telephone Encounter (Signed)
Requested medication (s) are due for refill today: yes  Requested medication (s) are on the active medication list: yes  Last refill:  01/04/2019  Future visit scheduled: yes  Notes to clinic:  This refill cannot be delegated   Requested Prescriptions  Pending Prescriptions Disp Refills   ALPRAZolam (XANAX) 0.25 MG tablet [Pharmacy Med Name: alprazolam 0.25 mg tablet] 90 tablet 1    Sig: TAKE 1 TABLET BY MOUTH 3 TIMES DAILY      Not Delegated - Psychiatry:  Anxiolytics/Hypnotics Failed - 01/31/2019  8:21 AM      Failed - This refill cannot be delegated      Failed - Urine Drug Screen completed in last 360 days.      Passed - Valid encounter within last 6 months    Recent Outpatient Visits           3 months ago Essential hypertension   [facility] at [place], MD       Future Appointments             In 5 days Forrest Moron, MD Primary Care at [place], [facility]   In 8 months Forrest Moron, MD Primary Care at [place], [facility]

## 2019-02-01 NOTE — Telephone Encounter (Signed)
Please verify if the patient is taking Nexium or Protonix.  Also since she is 73 we can let her know that she does not have to take the statin drug anymore.  If she would like to take one less pill we can stop it.  Her cholesterol levels are doing great.

## 2019-02-02 ENCOUNTER — Other Ambulatory Visit: Payer: Self-pay

## 2019-02-02 NOTE — Telephone Encounter (Signed)
Please let the patient know that I need her to make an appointment.  She had a mychart message with hypotension and headaches. I need to find out what is going on before I refill xanax.

## 2019-02-02 NOTE — Telephone Encounter (Signed)
Appt made 02/05/2019 4:20

## 2019-02-05 ENCOUNTER — Encounter: Payer: Self-pay | Admitting: Family Medicine

## 2019-02-05 ENCOUNTER — Other Ambulatory Visit: Payer: Self-pay

## 2019-02-05 ENCOUNTER — Ambulatory Visit (INDEPENDENT_AMBULATORY_CARE_PROVIDER_SITE_OTHER): Payer: Medicare HMO | Admitting: Family Medicine

## 2019-02-05 VITALS — BP 146/73 | HR 85 | Temp 97.6°F | Resp 17 | Ht 59.0 in | Wt 119.0 lb

## 2019-02-05 DIAGNOSIS — R300 Dysuria: Secondary | ICD-10-CM

## 2019-02-05 DIAGNOSIS — Z23 Encounter for immunization: Secondary | ICD-10-CM

## 2019-02-05 DIAGNOSIS — R21 Rash and other nonspecific skin eruption: Secondary | ICD-10-CM | POA: Diagnosis not present

## 2019-02-05 DIAGNOSIS — I10 Essential (primary) hypertension: Secondary | ICD-10-CM

## 2019-02-05 DIAGNOSIS — E782 Mixed hyperlipidemia: Secondary | ICD-10-CM | POA: Diagnosis not present

## 2019-02-05 DIAGNOSIS — Z13820 Encounter for screening for osteoporosis: Secondary | ICD-10-CM | POA: Diagnosis not present

## 2019-02-05 LAB — POCT URINALYSIS DIP (MANUAL ENTRY)
Bilirubin, UA: NEGATIVE
Glucose, UA: NEGATIVE mg/dL
Ketones, POC UA: NEGATIVE mg/dL
Nitrite, UA: NEGATIVE
Protein Ur, POC: 30 mg/dL — AB
Spec Grav, UA: 1.02 (ref 1.010–1.025)
Urobilinogen, UA: 0.2 E.U./dL
pH, UA: 6 (ref 5.0–8.0)

## 2019-02-05 MED ORDER — TRIAMCINOLONE ACETONIDE 0.025 % EX OINT: 1.0000 "application " | TOPICAL_OINTMENT | Freq: Two times a day (BID) | CUTANEOUS | 0 refills | Status: DC

## 2019-02-05 NOTE — Patient Instructions (Signed)
° ° ° °  If you have lab work done today you will be contacted with your lab results within the next 2 weeks.  If you have not heard from us then please contact us. The fastest way to get your results is to register for My Chart. ° ° °IF you received an x-ray today, you will receive an invoice from [facility]. Please contact [facility] at [phone] with questions or concerns regarding your invoice.  ° °IF you received labwork today, you will receive an invoice from [facility]. Please contact [facility] at [phone] with questions or concerns regarding your invoice.  ° °Our billing staff will not be able to assist you with questions regarding bills from these companies. ° °You will be contacted with the lab results as soon as they are available. The fastest way to get your results is to activate your My Chart account. Instructions are located on the last page of this paperwork. If you have not heard from us regarding the results in 2 weeks, please contact this office. °  ° ° ° °

## 2019-02-05 NOTE — Progress Notes (Signed)
Established Patient Office Visit  Subjective:  Patient ID: Kayla Lloyd, female    DOB: 1923-03-24  Age: 86 y.o. MRN: [id number]  CC:  Chief Complaint  Patient presents with  . discuss headaches, some are so bad that they kept her down a    Pt interested in colonoscopy and mammogram testing.  Referral to neurology for headaches  . therapy referral-older therapist preferred    wants a rx for cephalaxin, pt has vaginal atrophy and is having pain in her crouch.    HPI Kayla Lloyd presents for   Hypertension: Patient here for follow-up of elevated blood pressure. She is not exercising and is adherent to low salt diet.  Blood pressure is well controlled at home. Cardiac symptoms none. Patient denies chest pain, chest pressure/discomfort, dyspnea, fatigue, lower extremity edema and near-syncope.  Cardiovascular risk factors: advanced age (older than 56 for men, 68 for women) and hypertension. Use of agents associated with hypertension: none. History of target organ damage: none. BP Readings from Last 3 Encounters:  03/20/19 120/67  03/05/19 122/68  02/05/19 (!) 146/73   She would like to have mammogram, pap smear and colonoscopy She would like to see Neurology for her headaches She does not want a covid vaccine     Past Medical History:  Diagnosis Date  . Bowel obstruction ([place]) 1950  . Cancer of septum of nose (HCC)    melanoma  . Hypertension   . Hypothyroidism     Past Surgical History:  Procedure Laterality Date  . BIOPSY  12/07/2017   Procedure: BIOPSY;  Surgeon: Rush Landmark Telford Nab., MD;  Location: [facility];  Service: Gastroenterology;;  . CHOLECYSTECTOMY    . ESOPHAGOGASTRODUODENOSCOPY (EGD) WITH PROPOFOL N/A 12/07/2017   Procedure: ESOPHAGOGASTRODUODENOSCOPY (EGD) WITH PROPOFOL;  Surgeon: Rush Landmark Telford Nab., MD;  Location: [facility];  Service: Gastroenterology;  Laterality: N/A;  May need Dilation  . hysterecrtomy    . left  hip surgery    . ROTATOR CUFF REPAIR Right   . SMALL INTESTINE SURGERY      Family History  Problem Relation Age of Onset  . Uterine cancer Mother   . Hypertension Father   . Colon cancer Neg Hx   . Esophageal cancer Neg Hx   . Liver disease Neg Hx   . Inflammatory bowel disease Neg Hx   . Pancreatic cancer Neg Hx   . Rectal cancer Neg Hx   . Stomach cancer Neg Hx     Social History   Socioeconomic History  . Marital status: Widowed    Spouse name: Not on file  . Number of children: 8  . Years of education: Not on file  . Highest education level: Not on file  Occupational History  . Not on file  Tobacco Use  . Smoking status: Never Smoker  . Smokeless tobacco: Never Used  Substance and Sexual Activity  . Alcohol use: No    Comment: wine/rare  . Drug use: No  . Sexual activity: Not Currently  Other Topics Concern  . Not on file  Social History Narrative  . Not on file   Social Determinants of Health   Financial Resource Strain:   . Difficulty of Paying Living Expenses:   Food Insecurity:   . Worried About Charity fundraiser in the Last Year:   . Arboriculturist in the Last Year:   Transportation Needs:   . Film/video editor (Medical):   Marland Kitchen Lack of Transportation (Non-Medical):  Physical Activity:   . Days of Exercise per Week:   . Minutes of Exercise per Session:   Stress:   . Feeling of Stress :   Social Connections:   . Frequency of Communication with Friends and Family:   . Frequency of Social Gatherings with Friends and Family:   . Attends Religious Services:   . Active Member of Clubs or Organizations:   . Attends Archivist Meetings:   Marland Kitchen Marital Status:   Intimate Partner Violence:   . Fear of Current or Ex-Partner:   . Emotionally Abused:   Marland Kitchen Physically Abused:   . Sexually Abused:     Outpatient Medications Prior to Visit  Medication Sig Dispense Refill  . BC FAST PAIN RELIEF 650-195-33.3 MG PACK Take 1 packet by mouth  daily as needed (for pain.).    Marland Kitchen bisacodyl (DULCOLAX) 5 MG EC tablet Take 5 mg by mouth daily as needed (for constipation.).     Marland Kitchen castor oil liquid Take 15 mLs by mouth daily as needed for moderate constipation.    . CEQUA 0.09 % SOLN Apply 1 drop to eye 2 (two) times daily.    . cetirizine (ZYRTEC) 10 MG tablet Take 1 tablet (10 mg total) by mouth daily. 90 tablet 3  . Cholecalciferol (VITAMIN D3) 50 MCG (2000 UT) TABS Take 2,000 Units by mouth daily.    Marland Kitchen docusate sodium (DULCOLAX) 100 MG capsule Take 100 mg by mouth 2 (two) times daily as needed (for constipation).     Marland Kitchen esomeprazole (NEXIUM) 40 MG capsule Take 1 capsule (40 mg total) by mouth 2 (two) times daily. 60 capsule 6  . Ginkgo Biloba 120 MG CAPS Take 120 mg by mouth daily.    . hydrocortisone cream 1 % Apply 1 application topically 2 (two) times daily.    . Lactobacillus (PROBIOTIC ACIDOPHILUS PO) Take 1 capsule by mouth daily.    . Liniments (SALONPAS PAIN RELIEF PATCH EX) Place 1 patch onto the skin daily as needed (for shoulder pain.).    Marland Kitchen loperamide (IMODIUM) 2 MG capsule Take by mouth as needed for diarrhea or loose stools.    . Magnesium 500 MG TABS Take 500 mg by mouth 3 (three) times a week.    . Menthol, Topical Analgesic, 154 MG PADS Place 1 each onto the skin daily as needed (for hip pain.).    Marland Kitchen pantoprazole (PROTONIX) 40 MG tablet     . phenazopyridine (PYRIDIUM) 200 MG tablet Take 1 tablet (200 mg total) by mouth 3 (three) times daily as needed for pain (urethral spasm). 10 tablet 1  . PROLIA 60 MG/ML SOSY injection Take 60 mg by mouth every 6 (six) months.  0  . Pumpkin Seed-Soy Germ (AZO BLADDER CONTROL/GO-LESS PO) Take 1 tablet by mouth daily as needed (for bladder pain.).    Marland Kitchen RESTASIS 0.05 % ophthalmic emulsion     . simvastatin (ZOCOR) 40 MG tablet Take 1 tablet (40 mg total) by mouth daily. 30 tablet 0  . traMADol (ULTRAM) 50 MG tablet Take 50 mg by mouth every 6 (six) hours as needed (pain).    Marland Kitchen trolamine  salicylate (ASPERCREME) 10 % cream Apply 1 application topically 3 (three) times daily as needed for muscle pain.    . vitamin C (ASCORBIC ACID) 500 MG tablet Take 500 mg by mouth daily.    Marland Kitchen ALPRAZolam (XANAX) 0.25 MG tablet TAKE 1 TABLET BY MOUTH 3 TIMES DAILY 90 tablet 1  . conjugated  estrogens (PREMARIN) vaginal cream Place 1 Applicatorful vaginally daily. Place 1/2 applicator vaginally twice a week 42.5 g 3  . levothyroxine (SYNTHROID, LEVOTHROID) 75 MCG tablet Take 75 mcg daily before breakfast by mouth.    . losartan (COZAAR) 100 MG tablet Take 100 mg by mouth daily.  5   No facility-administered medications prior to visit.    Allergies  Allergen Reactions  . Brimonidine   . Codeine Other (See Comments)  . Levofloxacin Other (See Comments)  . Lidocaine   . Meloxicam   . Oxycodone   . Pregabalin   . Sulfa Antibiotics Other (See Comments)  . Timolol Maleate   . Zolpidem     ROS Review of Systems  Respiratory: Negative for chest tightness, shortness of breath and wheezing.   Cardiovascular: Negative for chest pain and palpitations.  Genitourinary: Positive for pelvic pain and vaginal pain. Negative for vaginal bleeding and vaginal discharge.  Neurological: Positive for headaches. Negative for seizures, speech difficulty, weakness and numbness.      Objective:    Physical Exam  BP (!) 146/73 (BP Location: Right Arm, Patient Position: Sitting, Cuff Size: Normal)   Pulse 85   Temp 97.6 F (36.4 C) (Oral)   Resp 17   Ht 4\' 11"  (1.499 m)   Wt 119 lb (54 kg)   SpO2 97%   BMI 24.04 kg/m  Wt Readings from Last 3 Encounters:  03/20/19 117 lb (53.1 kg)  03/05/19 117 lb (53.1 kg)  02/05/19 119 lb (54 kg)   Physical Exam  Constitutional: Oriented to person, place, and time. Appears thin with kyphosis HENT:  Head: Normocephalic and atraumatic.  Eyes: Conjunctivae and EOM are normal.  Cardiovascular: Normal rate, regular rhythm, normal heart sounds and intact distal  pulses.  No murmur heard. Pulmonary/Chest: Effort normal and breath sounds normal. No stridor. No respiratory distress. Has no wheezes.  Neurological: Is alert and oriented to person, place, and time.  Skin: Skin is warm. Capillary refill takes less than 2 seconds.  Psychiatric: Has a normal mood and affect. Behavior is normal. Judgment and thought content normal.    Health Maintenance Due  Topic Date Due  . TETANUS/TDAP  Never done  . DEXA SCAN  Never done  . PNA vac Low Risk Adult (2 of 2 - PPSV23) 11/19/2015  . INFLUENZA VACCINE  08/26/2018    There are no preventive care reminders to display for this patient.  Lab Results  Component Value Date   TSH 1.680 10/18/2018   Lab Results  Component Value Date   WBC 6.1 10/13/2017   HGB 13.0 10/13/2017   HCT 39.0 10/13/2017   MCV 94.8 10/13/2017   PLT 148.0 (L) 10/13/2017   Lab Results  Component Value Date   NA 138 10/18/2018   K 4.6 10/18/2018   CO2 24 10/18/2018   GLUCOSE 78 10/18/2018   BUN 16 10/18/2018   CREATININE 1.03 (H) 10/18/2018   BILITOT 0.7 10/18/2018   ALKPHOS 88 10/18/2018   AST 17 10/18/2018   ALT 32 10/18/2018   PROT 7.4 10/18/2018   ALBUMIN 4.7 (H) 10/18/2018   CALCIUM 9.7 10/18/2018   Lab Results  Component Value Date   CHOL 189 10/18/2018   Lab Results  Component Value Date   HDL 49 10/18/2018   Lab Results  Component Value Date   LDLCALC 129 (H) 10/18/2018   Lab Results  Component Value Date   TRIG 60 10/18/2018   Lab Results  Component Value Date  CHOLHDL 3.9 10/18/2018   No results found for: HGBA1C    Assessment & Plan:   Problem List Items Addressed This Visit      Cardiovascular and Mediastinum   Essential hypertension  - Patient's blood pressure is at goal of 139/89 or less. Condition is stable. Continue current medications and treatment plan. I recommend that you exercise for 30-45 minutes 5 days a week. I also recommend a balanced diet with fruits and vegetables  every day, lean meats, and little fried foods. The DASH diet (you can find this online) is a good example of this.      Other   Dysuria  -  Complete abx given by Gyne   Relevant Orders   POCT urinalysis dipstick (Completed)   Urine culture (Completed)   Hyperlipidemia    Other Visit Diagnoses    Screening for osteoporosis    -  Primary   Need for prophylactic vaccination against Streptococcus pneumoniae (pneumococcus)       Need for prophylactic vaccination and inoculation against influenza       Skin rash       Relevant Medications   triamcinolone (KENALOG) 0.025 % ointment      Meds ordered this encounter  Medications  . triamcinolone (KENALOG) 0.025 % ointment    Sig: Apply 1 application topically 2 (two) times daily.    Dispense:  30 g    Refill:  0    Follow-up: No follow-ups on file.    Forrest Moron, MD

## 2019-02-06 ENCOUNTER — Encounter: Payer: Self-pay | Admitting: *Deleted

## 2019-02-06 NOTE — Telephone Encounter (Signed)
Mychart message sent.

## 2019-02-07 LAB — URINE CULTURE

## 2019-02-09 DIAGNOSIS — K219 Gastro-esophageal reflux disease without esophagitis: Secondary | ICD-10-CM | POA: Diagnosis not present

## 2019-02-09 DIAGNOSIS — M722 Plantar fascial fibromatosis: Secondary | ICD-10-CM | POA: Diagnosis not present

## 2019-02-09 DIAGNOSIS — R1310 Dysphagia, unspecified: Secondary | ICD-10-CM | POA: Diagnosis not present

## 2019-02-09 DIAGNOSIS — R269 Unspecified abnormalities of gait and mobility: Secondary | ICD-10-CM | POA: Diagnosis not present

## 2019-02-12 ENCOUNTER — Encounter: Payer: Self-pay | Admitting: Family Medicine

## 2019-02-15 ENCOUNTER — Telehealth: Payer: Self-pay | Admitting: *Deleted

## 2019-02-15 NOTE — Telephone Encounter (Signed)
Schedule awv  

## 2019-02-17 ENCOUNTER — Encounter: Payer: Self-pay | Admitting: Family Medicine

## 2019-02-20 ENCOUNTER — Ambulatory Visit: Payer: Medicare HMO | Admitting: Podiatry

## 2019-02-25 ENCOUNTER — Encounter: Payer: Self-pay | Admitting: Family Medicine

## 2019-02-27 ENCOUNTER — Ambulatory Visit: Payer: Medicare HMO | Admitting: Podiatry

## 2019-02-27 ENCOUNTER — Encounter: Payer: Self-pay | Admitting: Family Medicine

## 2019-02-28 ENCOUNTER — Ambulatory Visit: Payer: Medicare HMO | Admitting: Family Medicine

## 2019-02-28 DIAGNOSIS — N952 Postmenopausal atrophic vaginitis: Secondary | ICD-10-CM | POA: Diagnosis not present

## 2019-02-28 DIAGNOSIS — N3289 Other specified disorders of bladder: Secondary | ICD-10-CM | POA: Diagnosis not present

## 2019-02-28 DIAGNOSIS — N9489 Other specified conditions associated with female genital organs and menstrual cycle: Secondary | ICD-10-CM | POA: Diagnosis not present

## 2019-02-28 DIAGNOSIS — N3281 Overactive bladder: Secondary | ICD-10-CM | POA: Diagnosis not present

## 2019-02-28 DIAGNOSIS — R300 Dysuria: Secondary | ICD-10-CM | POA: Diagnosis not present

## 2019-03-05 ENCOUNTER — Ambulatory Visit (INDEPENDENT_AMBULATORY_CARE_PROVIDER_SITE_OTHER): Payer: Medicare HMO | Admitting: Family Medicine

## 2019-03-05 ENCOUNTER — Other Ambulatory Visit: Payer: Self-pay

## 2019-03-05 ENCOUNTER — Ambulatory Visit: Payer: Medicare HMO | Admitting: Family Medicine

## 2019-03-05 ENCOUNTER — Encounter: Payer: Self-pay | Admitting: Family Medicine

## 2019-03-05 VITALS — BP 122/68 | HR 108 | Temp 97.2°F | Ht 59.0 in | Wt 117.0 lb

## 2019-03-05 DIAGNOSIS — N390 Urinary tract infection, site not specified: Secondary | ICD-10-CM

## 2019-03-05 DIAGNOSIS — N952 Postmenopausal atrophic vaginitis: Secondary | ICD-10-CM | POA: Diagnosis not present

## 2019-03-05 NOTE — Progress Notes (Signed)
Left leg darkness

## 2019-03-05 NOTE — Progress Notes (Signed)
Established Patient Office Visit  Subjective:  Patient ID: Rosamary Kenkel, female    DOB: 12/02/1923  Age: 86 y.o. MRN: [id number]  CC:  Chief Complaint  Patient presents with  . left leg blister like wound    ongoing for a few years.   . chrouch irratation  . Depression    PHQ9=16 due to pain     HPI Gennie Christopher presents for   She was prescribed betamethasone She should use periguard ointment after the steroid treatment She is using cold presses  She will follow up in March  She reports that she has a history of blister of the side of the left leg She states that she uses dermasil and the blister has resolved She was also using ice packs down to the ankle.  She states that she is frustrated with the pain from her vaginal irritation She reports that she is taking    She reports hurting so bad that it affects her mood Depression screen [facility] 2/9 04/24/2019 03/20/2019 03/05/2019 02/05/2019 10/18/2018  Decreased Interest 0 2 0 0 0  Down, Depressed, Hopeless 0 3 3 0 0  PHQ - 2 Score 0 5 3 0 0  Altered sleeping 0 3 3 - -  Tired, decreased energy 0 3 3 - -  Change in appetite - 1 1 - -  Feeling bad or failure about yourself  0 2 3 - -  Trouble concentrating 0 3 0 - -  Moving slowly or fidgety/restless 0 2 3 - -  Suicidal thoughts 0 0 0 - -  PHQ-9 Score 0 19 16 - -  Difficult doing work/chores Not difficult at all Not difficult at all Somewhat difficult - -      Past Medical History:  Diagnosis Date  . Bowel obstruction ([place]) 1950  . Cancer of septum of nose (HCC)    melanoma  . Hypertension   . Hypothyroidism     Past Surgical History:  Procedure Laterality Date  . BIOPSY  12/07/2017   Procedure: BIOPSY;  Surgeon: Rush Landmark Telford Nab., MD;  Location: [facility];  Service: Gastroenterology;;  . CHOLECYSTECTOMY    . ESOPHAGOGASTRODUODENOSCOPY (EGD) WITH PROPOFOL N/A 12/07/2017   Procedure: ESOPHAGOGASTRODUODENOSCOPY (EGD) WITH PROPOFOL;   Surgeon: Rush Landmark Telford Nab., MD;  Location: [facility];  Service: Gastroenterology;  Laterality: N/A;  May need Dilation  . hysterecrtomy    . left hip surgery    . ROTATOR CUFF REPAIR Right   . SMALL INTESTINE SURGERY      Family History  Problem Relation Age of Onset  . Uterine cancer Mother   . Hypertension Father   . Colon cancer Neg Hx   . Esophageal cancer Neg Hx   . Liver disease Neg Hx   . Inflammatory bowel disease Neg Hx   . Pancreatic cancer Neg Hx   . Rectal cancer Neg Hx   . Stomach cancer Neg Hx     Social History   Socioeconomic History  . Marital status: Widowed    Spouse name: Not on file  . Number of children: 8  . Years of education: Not on file  . Highest education level: Not on file  Occupational History  . Not on file  Tobacco Use  . Smoking status: Never Smoker  . Smokeless tobacco: Never Used  Substance and Sexual Activity  . Alcohol use: No    Comment: wine/rare  . Drug use: No  . Sexual activity: Not Currently  Other  Topics Concern  . Not on file  Social History Narrative  . Not on file   Social Determinants of Health   Financial Resource Strain:   . Difficulty of Paying Living Expenses:   Food Insecurity:   . Worried About Charity fundraiser in the Last Year:   . Arboriculturist in the Last Year:   Transportation Needs:   . Film/video editor (Medical):   Marland Kitchen Lack of Transportation (Non-Medical):   Physical Activity:   . Days of Exercise per Week:   . Minutes of Exercise per Session:   Stress:   . Feeling of Stress :   Social Connections:   . Frequency of Communication with Friends and Family:   . Frequency of Social Gatherings with Friends and Family:   . Attends Religious Services:   . Active Member of Clubs or Organizations:   . Attends Archivist Meetings:   Marland Kitchen Marital Status:   Intimate Partner Violence:   . Fear of Current or Ex-Partner:   . Emotionally Abused:   Marland Kitchen Physically Abused:   . Sexually  Abused:     Outpatient Medications Prior to Visit  Medication Sig Dispense Refill  . BC FAST PAIN RELIEF 650-195-33.3 MG PACK Take 1 packet by mouth daily as needed (for pain.).    Marland Kitchen betamethasone valerate ointment (VALISONE) 0.1 % 1 application.    . bisacodyl (DULCOLAX) 5 MG EC tablet Take 5 mg by mouth daily as needed (for constipation.).     Marland Kitchen castor oil liquid Take 15 mLs by mouth daily as needed for moderate constipation.    . CEQUA 0.09 % SOLN Apply 1 drop to eye 2 (two) times daily.    . cetirizine (ZYRTEC) 10 MG tablet Take 1 tablet (10 mg total) by mouth daily. 90 tablet 3  . Cholecalciferol (VITAMIN D3) 50 MCG (2000 UT) TABS Take 2,000 Units by mouth daily.    . clindamycin (CLEOCIN) 2 % vaginal cream Place 1 Applicatorful vaginally Nightly.    . docusate sodium (DULCOLAX) 100 MG capsule Take 100 mg by mouth 2 (two) times daily as needed (for constipation).     Marland Kitchen esomeprazole (NEXIUM) 40 MG capsule Take 1 capsule (40 mg total) by mouth 2 (two) times daily. 60 capsule 6  . fluconazole (DIFLUCAN) 200 MG tablet Take 1 tablet by mouth every other day.    . Ginkgo Biloba 120 MG CAPS Take 120 mg by mouth daily.    . hydrocortisone cream 1 % Apply 1 application topically 2 (two) times daily.    . Lactobacillus (PROBIOTIC ACIDOPHILUS PO) Take 1 capsule by mouth daily.    . Liniments (SALONPAS PAIN RELIEF PATCH EX) Place 1 patch onto the skin daily as needed (for shoulder pain.).    Marland Kitchen loperamide (IMODIUM) 2 MG capsule Take by mouth as needed for diarrhea or loose stools.    . Magnesium 500 MG TABS Take 500 mg by mouth 3 (three) times a week.    . Menthol, Topical Analgesic, 154 MG PADS Place 1 each onto the skin daily as needed (for hip pain.).    Marland Kitchen nitrofurantoin, macrocrystal-monohydrate, (MACROBID) 100 MG capsule Take 1 capsule by mouth 2 (two) times daily.    . phenazopyridine (PYRIDIUM) 200 MG tablet Take 1 tablet (200 mg total) by mouth 3 (three) times daily as needed for pain  (urethral spasm). 10 tablet 1  . PROLIA 60 MG/ML SOSY injection Take 60 mg by mouth every 6 (six)  months.  0  . Pumpkin Seed-Soy Germ (AZO BLADDER CONTROL/GO-LESS PO) Take 1 tablet by mouth daily as needed (for bladder pain.).    Marland Kitchen RESTASIS 0.05 % ophthalmic emulsion     . simvastatin (ZOCOR) 40 MG tablet Take 1 tablet (40 mg total) by mouth daily. 30 tablet 0  . traMADol (ULTRAM) 50 MG tablet Take 50 mg by mouth every 6 (six) hours as needed (pain).    . triamcinolone (KENALOG) 0.025 % ointment Apply 1 application topically 2 (two) times daily. 30 g 0  . trolamine salicylate (ASPERCREME) 10 % cream Apply 1 application topically 3 (three) times daily as needed for muscle pain.    . vitamin C (ASCORBIC ACID) 500 MG tablet Take 500 mg by mouth daily.    Marland Kitchen ALPRAZolam (XANAX) 0.25 MG tablet TAKE 1 TABLET BY MOUTH 3 TIMES DAILY 90 tablet 1  . conjugated estrogens (PREMARIN) vaginal cream Place 1 Applicatorful vaginally daily. Place 1/2 applicator vaginally twice a week 42.5 g 3  . levothyroxine (SYNTHROID, LEVOTHROID) 75 MCG tablet Take 75 mcg daily before breakfast by mouth.    . losartan (COZAAR) 100 MG tablet Take 100 mg by mouth daily.  5  . pantoprazole (PROTONIX) 40 MG tablet     . cephALEXin (KEFLEX) 500 MG capsule Take 1 capsule by mouth.     No facility-administered medications prior to visit.    Allergies  Allergen Reactions  . Brimonidine   . Codeine Other (See Comments)  . Levofloxacin Other (See Comments)  . Lidocaine   . Meloxicam   . Oxycodone   . Pregabalin   . Sulfa Antibiotics Other (See Comments)  . Timolol Maleate   . Zolpidem     ROS Review of Systems    Objective:    Physical Exam  BP 122/68   Pulse (!) 108   Temp (!) 97.2 F (36.2 C) (Temporal)   Ht 4\' 11"  (1.499 m)   Wt 117 lb (53.1 kg)   SpO2 95%   BMI 23.63 kg/m  Wt Readings from Last 3 Encounters:  03/20/19 117 lb (53.1 kg)  03/05/19 117 lb (53.1 kg)  02/05/19 119 lb (54 kg)   Physical Exam   Constitutional: Oriented to person, place, and time. Appears thin.  HENT:  Head: Normocephalic and atraumatic.  Eyes: Conjunctivae and EOM are normal.  Cardiovascular: Normal rate, regular rhythm, normal heart sounds and intact distal pulses.  No murmur heard. Pulmonary/Chest: Effort normal and breath sounds normal. No stridor. No respiratory distress. Has no wheezes.  Neurological: Is alert and oriented to person, place, and time.  Skin: Skin is warm. Capillary refill takes less than 2 seconds.  Psychiatric: Has a normal mood and affect. Behavior is normal. Judgment and thought content normal.    Health Maintenance Due  Topic Date Due  . COVID-19 Vaccine (1) Never done  . TETANUS/TDAP  Never done  . DEXA SCAN  Never done    There are no preventive care reminders to display for this patient.  Lab Results  Component Value Date   TSH 1.680 10/18/2018   Lab Results  Component Value Date   WBC 6.1 10/13/2017   HGB 13.0 10/13/2017   HCT 39.0 10/13/2017   MCV 94.8 10/13/2017   PLT 148.0 (L) 10/13/2017   Lab Results  Component Value Date   NA 138 10/18/2018   K 4.6 10/18/2018   CO2 24 10/18/2018   GLUCOSE 78 10/18/2018   BUN 16 10/18/2018   CREATININE  1.03 (H) 10/18/2018   BILITOT 0.7 10/18/2018   ALKPHOS 88 10/18/2018   AST 17 10/18/2018   ALT 32 10/18/2018   PROT 7.4 10/18/2018   ALBUMIN 4.7 (H) 10/18/2018   CALCIUM 9.7 10/18/2018   Lab Results  Component Value Date   CHOL 189 10/18/2018   Lab Results  Component Value Date   HDL 49 10/18/2018   Lab Results  Component Value Date   LDLCALC 129 (H) 10/18/2018   Lab Results  Component Value Date   TRIG 60 10/18/2018   Lab Results  Component Value Date   CHOLHDL 3.9 10/18/2018   No results found for: HGBA1C    Assessment & Plan:   Problem List Items Addressed This Visit      Genitourinary   Vaginal atrophy - Primary    Other Visit Diagnoses    Acute UTI       Relevant Medications   fluconazole  (DIFLUCAN) 200 MG tablet     Continue plan outlined by Gynecology  No orders of the defined types were placed in this encounter.   Follow-up: Return if symptoms worsen or fail to improve.    Forrest Moron, MD

## 2019-03-05 NOTE — Patient Instructions (Addendum)
   Take Nitrofurantoin -  1 capsule by mouth 2 times a day for 5 days Take Clindamycin 2% vaginal cream - place 1 applicator vaginally nightly for 7 days Fluconazole 200mg  tablet - take one tablet every other day for a total of 3 doses    If you have lab work done today you will be contacted with your lab results within the next 2 weeks.  If you have not heard from [place] then please contact us. The fastest way to get your results is to register for My Chart.   IF you received an x-ray today, you will receive an invoice from [facility]. Please contact [facility] at [phone] with questions or concerns regarding your invoice.   IF you received labwork today, you will receive an invoice from [facility]. Please contact [facility] at [phone] with questions or concerns regarding your invoice.   Our billing staff will not be able to assist you with questions regarding bills from these companies.  You will be contacted with the lab results as soon as they are available. The fastest way to get your results is to activate your My Chart account. Instructions are located on the last page of this paperwork. If you have not heard from [place] regarding the results in 2 weeks, please contact this office.

## 2019-03-08 ENCOUNTER — Telehealth: Payer: Self-pay | Admitting: Family Medicine

## 2019-03-08 NOTE — Telephone Encounter (Signed)
Requested medication (s) are due for refill today -yes  Requested medication (s) are on the active medication list -yes  Future visit scheduled -yes  Last refill: 01/04/19  Notes to clinic: Rx refill request for non delegated medication- sent for PCP review   Requested Prescriptions  Pending Prescriptions Disp Refills   ALPRAZolam (XANAX) 0.25 MG tablet [Pharmacy Med Name: alprazolam 0.25 mg tablet] 90 tablet 1    Sig: TAKE 1 TABLET BY MOUTH 3 TIMES DAILY      Not Delegated - Psychiatry:  Anxiolytics/Hypnotics Failed - 03/08/2019  9:02 AM      Failed - This refill cannot be delegated      Failed - Urine Drug Screen completed in last 360 days.      Passed - Valid encounter within last 6 months    Recent Outpatient Visits           3 days ago Vaginal atrophy   Primary Care at [facility], Arlie Solomons, MD   1 month ago Screening for osteoporosis   Primary Care at [facility], Arlie Solomons, MD   4 months ago Essential hypertension   Primary Care at [place], MD       Future Appointments             In 7 months Forrest Moron, MD Primary Care at [place], [facility]                Requested Prescriptions  Pending Prescriptions Disp Refills   ALPRAZolam (XANAX) 0.25 MG tablet [Pharmacy Med Name: alprazolam 0.25 mg tablet] 90 tablet 1    Sig: TAKE 1 TABLET BY MOUTH 3 TIMES DAILY      Not Delegated - Psychiatry:  Anxiolytics/Hypnotics Failed - 03/08/2019  9:02 AM      Failed - This refill cannot be delegated      Failed - Urine Drug Screen completed in last 360 days.      Passed - Valid encounter within last 6 months    Recent Outpatient Visits           3 days ago Vaginal atrophy   Primary Care at [facility], Arlie Solomons, MD   1 month ago Screening for osteoporosis   Primary Care at [place], MD   4 months ago Essential hypertension   Primary Care at [place], MD       Future Appointments             In 7 months  Forrest Moron, MD Primary Care at [place], [facility]

## 2019-03-08 NOTE — Telephone Encounter (Signed)
Patient is requesting a refill of the following medications: Requested Prescriptions   Pending Prescriptions Disp Refills   ALPRAZolam (XANAX) 0.25 MG tablet [Pharmacy Med Name: alprazolam 0.25 mg tablet] 90 tablet 1    Sig: TAKE 1 TABLET BY MOUTH 3 TIMES DAILY    Date of patient request: 03/08/19 Last office visit:03/05/19 Date of last refill:  01/04/19 Last refill amount: 90 Follow up time period per chart: 10/19/19

## 2019-03-13 ENCOUNTER — Encounter: Payer: Self-pay | Admitting: Family Medicine

## 2019-03-13 ENCOUNTER — Telehealth: Payer: Self-pay | Admitting: Family Medicine

## 2019-03-13 NOTE — Telephone Encounter (Signed)
What is the name of the medication?Kayla (XANAX) 0.25 MG tablet [id number]    Have you contacted your pharmacy to request a refill? The pharmacy is checking. Pharmacy  [facility] - [place], [place] - [address]  [address], [place] [address]  Phone:  [phone] Fax:  [phone]      Which pharmacy would you like this sent to? above  Patient notified that their request is being sent to the clinical staff for review and that they should receive a call once it is complete. If they do not receive a call within 72 hours they can check with their pharmacy or our office.

## 2019-03-13 NOTE — Telephone Encounter (Signed)
Please Advise

## 2019-03-13 NOTE — Telephone Encounter (Signed)
Pt called stating that the pharmacy has not received this request. Please advise.

## 2019-03-14 MED ORDER — ALPRAZOLAM 0.25 MG PO TABS: 0.2500 mg | ORAL_TABLET | Freq: Three times a day (TID) | ORAL | 1 refills | Status: DC

## 2019-03-14 NOTE — Telephone Encounter (Signed)
Notified patient.

## 2019-03-14 NOTE — Telephone Encounter (Signed)
[facility] to [facility] Please notify the patient

## 2019-03-14 NOTE — Telephone Encounter (Signed)
Spoke with patient and she has already left a message for her gynecologist , but ask could you refill her Alprazolam 0.25 mg because she is currently out.  Please Advise

## 2019-03-14 NOTE — Telephone Encounter (Signed)
Please notify the patient that I do not have any additional recommendation.  She should talk to her Editor, commissioning.

## 2019-03-15 ENCOUNTER — Other Ambulatory Visit: Payer: Self-pay | Admitting: Family Medicine

## 2019-03-19 NOTE — Telephone Encounter (Signed)
Pt has her meds

## 2019-03-19 NOTE — Telephone Encounter (Signed)
Patient is requesting a refill of the following medications: Requested Prescriptions   Pending Prescriptions Disp Refills  . ALPRAZolam (XANAX) 0.25 MG tablet [Pharmacy Med Name: alprazolam 0.25 mg tablet] 90 tablet 1    Sig: TAKE 1 TABLET BY MOUTH 3 TIMES DAILY    Date of patient request: 03/15/2019 Last office visit:03/05/2019 Date of last refill: 03/14/2019 Last refill amount: 90 tablets Follow up time period per chart: Follow up appointment 10/19/2019

## 2019-03-20 ENCOUNTER — Ambulatory Visit (INDEPENDENT_AMBULATORY_CARE_PROVIDER_SITE_OTHER): Payer: Medicare HMO | Admitting: Adult Health Nurse Practitioner

## 2019-03-20 ENCOUNTER — Encounter: Payer: Self-pay | Admitting: Family Medicine

## 2019-03-20 ENCOUNTER — Other Ambulatory Visit: Payer: Self-pay

## 2019-03-20 VITALS — BP 120/67 | HR 97 | Temp 98.1°F | Ht 59.0 in | Wt 117.0 lb

## 2019-03-20 DIAGNOSIS — R300 Dysuria: Secondary | ICD-10-CM

## 2019-03-20 DIAGNOSIS — N390 Urinary tract infection, site not specified: Secondary | ICD-10-CM

## 2019-03-20 DIAGNOSIS — N760 Acute vaginitis: Secondary | ICD-10-CM | POA: Diagnosis not present

## 2019-03-20 LAB — POCT WET + KOH PREP
Trich by wet prep: ABSENT
Yeast by KOH: ABSENT
Yeast by wet prep: ABSENT

## 2019-03-20 LAB — POCT URINALYSIS DIP (MANUAL ENTRY)
Bilirubin, UA: NEGATIVE
Glucose, UA: NEGATIVE mg/dL
Ketones, POC UA: NEGATIVE mg/dL
Nitrite, UA: NEGATIVE
Protein Ur, POC: 30 mg/dL — AB
Spec Grav, UA: 1.02 (ref 1.010–1.025)
Urobilinogen, UA: 0.2 E.U./dL
pH, UA: 6 (ref 5.0–8.0)

## 2019-03-20 MED ORDER — FLUCONAZOLE 150 MG PO TABS: ORAL_TABLET | ORAL | 1 refills | Status: DC

## 2019-03-20 MED ORDER — CEPHALEXIN 125 MG/5ML PO SUSR: 125.0000 mg | Freq: Every day | ORAL | 3 refills | Status: AC

## 2019-03-20 MED ORDER — CEPHALEXIN 500 MG PO CAPS: 500.0000 mg | ORAL_CAPSULE | Freq: Four times a day (QID) | ORAL | 0 refills | Status: AC

## 2019-03-20 NOTE — Patient Instructions (Signed)
° ° ° °  If you have lab work done today you will be contacted with your lab results within the next 2 weeks.  If you have not heard from us then please contact us. The fastest way to get your results is to register for My Chart. ° ° °IF you received an x-ray today, you will receive an invoice from [facility]. Please contact [facility] at [phone] with questions or concerns regarding your invoice.  ° °IF you received labwork today, you will receive an invoice from [facility]. Please contact [facility] at [phone] with questions or concerns regarding your invoice.  ° °Our billing staff will not be able to assist you with questions regarding bills from these companies. ° °You will be contacted with the lab results as soon as they are available. The fastest way to get your results is to activate your My Chart account. Instructions are located on the last page of this paperwork. If you have not heard from us regarding the results in 2 weeks, please contact this office. °  ° ° ° °

## 2019-03-20 NOTE — Progress Notes (Signed)
   03/20/2019  Kayla Lloyd 1923-08-30 [id number]  SUBJECTIVE: Kayla Lloyd is a 86 y.o. female who complains of urinary frequency, urgency and dysuria x3 days, without flank pain, fever, chills, or abnormal vaginal discharge or bleeding.   OBJECTIVE: Appears well, in no apparent distress.  Vital signs are normal. The abdomen is soft without tenderness, guarding, mass, rebound or organomegaly. No CVA tenderness or inguinal adenopathy noted. Urine dipstick shows positive for WBC's, positive for protein and positive for leukocytes.  Micro exam: not done.   ASSESSMENT: UTI uncomplicated without evidence of pyelonephritis  PLAN: 1. Dysuria   2. Vaginitis and vulvovaginitis   3. Chronic UTI    Meds ordered this encounter  Medications  . cephALEXin (KEFLEX) 125 MG/5ML suspension    Sig: Take 5 mLs (125 mg total) by mouth daily.    Dispense:  3750 mL    Refill:  3    Daily prophylaxis for UTI  . cephALEXin (KEFLEX) 500 MG capsule    Sig: Take 1 capsule (500 mg total) by mouth 4 (four) times daily for 7 days.    Dispense:  28 capsule    Refill:  0  . fluconazole (DIFLUCAN) 150 MG tablet    Sig: I tab q 3 days    Dispense:  3 tablet    Refill:  1   I took about 25 minutes reviewing all meds used for UTIs and her vaginal atrophy with patient and her daughter  I have put her on long term suppression for UTIs.  She is inline with this plan.

## 2019-03-21 NOTE — Telephone Encounter (Signed)
Pt was seen in office by Jens Som, NP concerning issue.

## 2019-03-22 ENCOUNTER — Telehealth: Payer: Self-pay | Admitting: Family Medicine

## 2019-03-22 NOTE — Telephone Encounter (Signed)
Call came from [facility]  What is the name of the medication? cephALEXin (KEFLEX) 125 MG/5ML suspension     Have you contacted your pharmacy to request a refill? Called in to let us know that the suspension only has 14 day once mixed .the lowest mg strength is 250 mg capsule /  Which pharmacy would you like this sent to?  [facility] - [place], [place] - [address]  [address], [place] [address]  Phone:  [phone] Fax:  [phone]  DEA #:  --  Patient notified that their request is being sent to the clinical staff for review and that they should receive a call once it is complete. If they do not receive a call within 72 hours they can check with their pharmacy or our office.

## 2019-03-23 LAB — URINE CULTURE

## 2019-03-27 ENCOUNTER — Telehealth: Payer: Self-pay | Admitting: Family Medicine

## 2019-03-27 NOTE — Telephone Encounter (Signed)
What is the name of the medication? cephALEXin (KEFLEX) 125 MG/5ML suspension    Have you contacted your pharmacy to request a refill? yes  Which pharmacy would you like this sent to? [facility] - [place], [place] - [address]  [address], [place] [address]  Phone:  [phone] Fax:  [phone]  DEA #:  --  Patient notified that their request is being sent to the clinical staff for review and that they should receive a call once it is complete. If they do not receive a call within 72 hours they can check with their pharmacy or our office.    Pharmacy told patient that they dont have this please send over again

## 2019-03-28 ENCOUNTER — Encounter: Payer: Self-pay | Admitting: Family Medicine

## 2019-03-28 NOTE — Telephone Encounter (Signed)
Patient is requesting a refill of the following medications: Requested Prescriptions   Pending Prescriptions Disp Refills  . ALPRAZolam (XANAX) 0.25 MG tablet 90 tablet 1    Sig: Take 1 tablet (0.25 mg total) by mouth 3 (three) times daily.    Date of patient request: 3/32021 Last office visit: 03/20/2019 w/ Felton Clinton Date of last refill: 03/14/2019  Last refill amount: 90 tabs for 30 days  Follow up time period per chart: 10/19/2019 follow up appointment

## 2019-03-28 NOTE — Telephone Encounter (Signed)
LVM on [employer]) to let her know that the medication has been sent in with 3 additional refills.

## 2019-03-30 ENCOUNTER — Encounter: Payer: Self-pay | Admitting: Family Medicine

## 2019-03-30 DIAGNOSIS — B373 Candidiasis of vulva and vagina: Secondary | ICD-10-CM | POA: Diagnosis not present

## 2019-03-30 DIAGNOSIS — R300 Dysuria: Secondary | ICD-10-CM | POA: Diagnosis not present

## 2019-03-30 DIAGNOSIS — N760 Acute vaginitis: Secondary | ICD-10-CM | POA: Diagnosis not present

## 2019-03-30 DIAGNOSIS — N952 Postmenopausal atrophic vaginitis: Secondary | ICD-10-CM | POA: Diagnosis not present

## 2019-03-30 DIAGNOSIS — N898 Other specified noninflammatory disorders of vagina: Secondary | ICD-10-CM | POA: Diagnosis not present

## 2019-04-01 ENCOUNTER — Encounter: Payer: Self-pay | Admitting: Family Medicine

## 2019-04-02 ENCOUNTER — Other Ambulatory Visit: Payer: Self-pay | Admitting: Family Medicine

## 2019-04-02 ENCOUNTER — Encounter: Payer: Self-pay | Admitting: Family Medicine

## 2019-04-02 NOTE — Telephone Encounter (Signed)
Losartan 100 mg requested Last refilled by a  Historical provider on 11/06/17

## 2019-04-02 NOTE — Telephone Encounter (Signed)
Pt would like all prescriptions renewed and sent to [facility], other than the Tramadol and the Alprazolam which [facility]   Last OV 03/20/2019

## 2019-04-02 NOTE — Telephone Encounter (Signed)
Requested medication (s) are due for refill today: yes  Requested medication (s) are on the active medication list: as a historical med  Last refill:  11/30/2016  Future visit scheduled: yes in 6 months  Notes to clinic:  historical medication and provider   Requested Prescriptions  Pending Prescriptions Disp Refills   levothyroxine (SYNTHROID) 75 MCG tablet [Pharmacy Med Name: levothyroxine 75 mcg tablet] 90 tablet 2    Sig: TAKE 1 TABLET BY MOUTH EVERY DAY      Endocrinology:  Hypothyroid Agents Failed - 04/02/2019  4:18 PM      Failed - TSH needs to be rechecked within 3 months after an abnormal result. Refill until TSH is due.      Passed - TSH in normal range and within 360 days    TSH  Date Value Ref Range Status  10/18/2018 1.680 0.450 - 4.500 uIU/mL Final          Passed - Valid encounter within last 12 months    Recent Outpatient Visits           1 week ago Dysuria   Primary Care at [facility], Lorelee Market, NP   4 weeks ago Vaginal atrophy   Primary Care at [facility], Arlie Solomons, MD   1 month ago Screening for osteoporosis   Primary [facility] at [place], MD   5 months ago Essential hypertension   Primary Care at [place], MD       Future Appointments             In 6 months Forrest Moron, MD Primary Care at [place], [facility]

## 2019-04-03 ENCOUNTER — Encounter: Payer: Self-pay | Admitting: Family Medicine

## 2019-04-03 ENCOUNTER — Encounter: Payer: Self-pay | Admitting: Adult Health Nurse Practitioner

## 2019-04-03 DIAGNOSIS — N760 Acute vaginitis: Secondary | ICD-10-CM | POA: Insufficient documentation

## 2019-04-03 DIAGNOSIS — N390 Urinary tract infection, site not specified: Secondary | ICD-10-CM | POA: Insufficient documentation

## 2019-04-03 MED ORDER — ALPRAZOLAM 0.25 MG PO TABS: 0.2500 mg | ORAL_TABLET | Freq: Three times a day (TID) | ORAL | 0 refills | Status: DC

## 2019-04-03 NOTE — Telephone Encounter (Signed)
She is not due for refill until 04/11/2019.  The pharmacy will refill on 04/10/19. If she is needing the xanax more frequently than prescribed then she needs to come in for an office visit to discuss other options.

## 2019-04-06 DIAGNOSIS — N819 Female genital prolapse, unspecified: Secondary | ICD-10-CM | POA: Diagnosis not present

## 2019-04-06 DIAGNOSIS — N952 Postmenopausal atrophic vaginitis: Secondary | ICD-10-CM | POA: Diagnosis not present

## 2019-04-06 DIAGNOSIS — N3946 Mixed incontinence: Secondary | ICD-10-CM | POA: Diagnosis not present

## 2019-04-06 DIAGNOSIS — N9489 Other specified conditions associated with female genital organs and menstrual cycle: Secondary | ICD-10-CM | POA: Diagnosis not present

## 2019-04-10 ENCOUNTER — Encounter: Payer: Self-pay | Admitting: Family Medicine

## 2019-04-11 ENCOUNTER — Telehealth: Payer: Self-pay

## 2019-04-11 NOTE — Telephone Encounter (Signed)
Pt finishes last dose abx today, wants to know if theres supposed to be a long term liquid abx she takes. also wants a referral to a new urologist as they do not like Dr.Laraco. also needs refill Alprazolam and tramadol per a latter message   Patient is requesting a refill of the following medications: Requested Prescriptions    No prescriptions requested or ordered in this encounter   Tramadol Date of patient request: 04/11/2019 Last office visit: 03/20/2019 Date of last refill: unknown Last refill amount: unknown Follow up time period per chart: not listed

## 2019-04-11 NOTE — Telephone Encounter (Signed)
Per f/u question note pt requesting xanax and tramadol.  Pt also wanted a referral to another urologist.  Historical provider who prescribed the tramadol.  Contacted pt via phone to ask why and who prescribed tramadol to her.  Pt advises [facility] and she is not in need of a refill as she has plenty.  Pt thinks tramadol reacted with ? Cephalexin she is taking as she woke up feeling fuzzy headed.  Per pt she took two 50 mg tramadol.  I asked if she wanted to be referred to another urologist and she advises she sees Dr. [name] in [facility] and she is satisfied with the care she is getting.  I advised the xanax refill was sent over on 04/10/2019 and is at pharmacy for p/u.  Per pt she appreciated the call and she has another liquid antibiotic she hasn't started to take for vaginal infection and that she will be finished with the cephalexin today.  Advised to contact our office if she need [place] for anything.  Pt agreeable.

## 2019-04-12 NOTE — Telephone Encounter (Signed)
Please advise on liquid antibiotic use please.

## 2019-04-15 ENCOUNTER — Encounter: Payer: Self-pay | Admitting: Family Medicine

## 2019-04-18 NOTE — Telephone Encounter (Signed)
Pt has finished abx but is still having burning and "pinching" with urination. Should she return to the office for revaluation?

## 2019-04-24 ENCOUNTER — Ambulatory Visit (INDEPENDENT_AMBULATORY_CARE_PROVIDER_SITE_OTHER): Payer: Medicare HMO | Admitting: Adult Health Nurse Practitioner

## 2019-04-24 ENCOUNTER — Other Ambulatory Visit: Payer: Self-pay

## 2019-04-24 ENCOUNTER — Ambulatory Visit (INDEPENDENT_AMBULATORY_CARE_PROVIDER_SITE_OTHER): Payer: Medicare HMO

## 2019-04-24 ENCOUNTER — Encounter: Payer: Self-pay | Admitting: Podiatry

## 2019-04-24 ENCOUNTER — Ambulatory Visit (INDEPENDENT_AMBULATORY_CARE_PROVIDER_SITE_OTHER): Payer: Medicare HMO | Admitting: Podiatry

## 2019-04-24 DIAGNOSIS — M6740 Ganglion, unspecified site: Secondary | ICD-10-CM

## 2019-04-24 DIAGNOSIS — S9032XA Contusion of left foot, initial encounter: Secondary | ICD-10-CM

## 2019-04-24 DIAGNOSIS — N390 Urinary tract infection, site not specified: Secondary | ICD-10-CM

## 2019-04-24 DIAGNOSIS — M79674 Pain in right toe(s): Secondary | ICD-10-CM

## 2019-04-24 DIAGNOSIS — N3281 Overactive bladder: Secondary | ICD-10-CM

## 2019-04-24 DIAGNOSIS — W208XXA Other cause of strike by thrown, projected or falling object, initial encounter: Secondary | ICD-10-CM | POA: Diagnosis not present

## 2019-04-24 DIAGNOSIS — R300 Dysuria: Secondary | ICD-10-CM | POA: Diagnosis not present

## 2019-04-24 DIAGNOSIS — N760 Acute vaginitis: Secondary | ICD-10-CM | POA: Diagnosis not present

## 2019-04-24 DIAGNOSIS — B351 Tinea unguium: Secondary | ICD-10-CM

## 2019-04-24 DIAGNOSIS — N952 Postmenopausal atrophic vaginitis: Secondary | ICD-10-CM

## 2019-04-24 DIAGNOSIS — Z23 Encounter for immunization: Secondary | ICD-10-CM | POA: Insufficient documentation

## 2019-04-24 DIAGNOSIS — M79675 Pain in left toe(s): Secondary | ICD-10-CM | POA: Diagnosis not present

## 2019-04-24 NOTE — Patient Instructions (Signed)
° ° ° °  If you have lab work done today you will be contacted with your lab results within the next 2 weeks.  If you have not heard from us then please contact us. The fastest way to get your results is to register for My Chart. ° ° °IF you received an x-ray today, you will receive an invoice from [facility]. Please contact [facility] at [phone] with questions or concerns regarding your invoice.  ° °IF you received labwork today, you will receive an invoice from [facility]. Please contact [facility] at [phone] with questions or concerns regarding your invoice.  ° °Our billing staff will not be able to assist you with questions regarding bills from these companies. ° °You will be contacted with the lab results as soon as they are available. The fastest way to get your results is to activate your My Chart account. Instructions are located on the last page of this paperwork. If you have not heard from us regarding the results in 2 weeks, please contact this office. °  ° ° ° °

## 2019-04-24 NOTE — Patient Instructions (Signed)

## 2019-04-25 NOTE — Progress Notes (Signed)
Subjective: Kayla Lloyd presents today for follow up of painful mycotic nails b/l that are difficult to trim. Pain interferes with ambulation. Aggravating factors include wearing enclosed shoe gear. Pain is relieved with periodic professional debridement and suspected trauma  at base of left 2nd and 3rd digits. She dropped a shower handle bar on her foot about one month ago near left 2nd and 3rd digits. She still has pain from injury.  Allergies  Allergen Reactions  . Brimonidine   . Codeine Other (See Comments)  . Levofloxacin Other (See Comments)  . Lidocaine   . Meloxicam   . Oxycodone   . Pregabalin   . Sulfa Antibiotics Other (See Comments)  . Timolol Maleate   . Zolpidem      Objective: There were no vitals filed for this visit.  Pt is 86 y.o. Caucasian female, thin build, in NAD. AAO x 3.  Vascular Examination:  Capillary fill time to digits <3 seconds b/l. Palpable DP pulses b/l. Palpable PT pulses b/l. Pedal hair absent b/l Skin temperature gradient within normal limits b/l. Evidence of chronic venous insufficiency b/l LE.  Dermatological Examination: Pedal skin is thin shiny, atrophic bilaterally. No open wounds bilaterally. No interdigital macerations bilaterally. Toenails 1-5 b/l elongated, dystrophic, thickened, crumbly with subungual debris and tenderness to dorsal palpation.  Musculoskeletal: Normal muscle strength 5/5 to all lower extremity muscle groups bilaterally, no gross bony deformities bilaterally and there is pain at base of left 2nd and 3rd digits with palpable nodule noted along extensor tendon.  Neurological: Protective sensation intact 5/5 intact bilaterally with 10g monofilament b/l Vibratory sensation intact b/l  Xray findings left foot: no gas in tissues, soft tissue swelling present left foot and no evidence of fracture left foot. No dislocations noted  Assessment: 1. Pain due to onychomycosis of toenails of both feet   2.  Accidentally struck by falling object, initial encounter   3. Contusion of left foot, initial encounter    Plan: -Toenails 1-5 b/l were debrided in length and girth with sterile nail nippers and dremel without iatrogenic bleeding.  -Patient to continue soft, supportive shoe gear daily. -Patient to report any pedal injuries to medical professional immediately. -Xray of left foot foot was performed and reviewed with patient and/or POA. -Discussed contusion left foot and injury to tendon resulting in ganglion cyst formation. Applied ace wrap to left foot and she may wrap it daily until symptoms resolve. Daughter will call if she needs to come in sooner. -Patient/POA to call should there be question/concern in the interim.  Return in about 3 months (around 07/25/2019) for nail trim.

## 2019-04-30 NOTE — Telephone Encounter (Signed)
Kayla Lloyd this [facility] patient so she will need to address this.  Please call pt and find out if she started the liquid med and how she is feeling.

## 2019-05-01 ENCOUNTER — Encounter: Payer: Self-pay | Admitting: Family Medicine

## 2019-05-02 ENCOUNTER — Telehealth: Payer: Self-pay | Admitting: Family Medicine

## 2019-05-02 DIAGNOSIS — N952 Postmenopausal atrophic vaginitis: Secondary | ICD-10-CM | POA: Insufficient documentation

## 2019-05-02 MED ORDER — CEPHALEXIN 500 MG PO CAPS: 500.0000 mg | ORAL_CAPSULE | Freq: Two times a day (BID) | ORAL | 1 refills | Status: AC

## 2019-05-02 MED ORDER — CEPHALEXIN 125 MG/5ML PO SUSR: 125.0000 mg | Freq: Every day | ORAL | 1 refills | Status: AC

## 2019-05-02 MED ORDER — PREDNISONE 5 MG PO TBEC: 1.0000 | DELAYED_RELEASE_TABLET | Freq: Every day | ORAL | 0 refills | Status: AC

## 2019-05-02 NOTE — Progress Notes (Signed)
Chief Complaint  Patient presents with  . Follow-up    Pt stated that she is feeling much better but still has some concerns and some pros/cons with gabapentin    HPI   Previously treated patient for UTI on 03/20/19 and was treated with Keflex, sensitive for her infection, Kleibsiella Pna  Feels that her UTI resolved and continues to have symptoms with atrophic vaginitis.  Feels burning and pinching to vaginal area.  Uncomfortable.   Her granddaughter is here with her and we are discussing a way to best prevent her persistent, chronic UTI. No UTI noted on UA today.  Would consider prophylaxis and given her recent cultures, sensitivities, and tolerated oral antibiotics, Cephalexin provides prophylaxis at a dose of 125mg  daily.    They have many questions regarding the atrophic vaginitis and her hx of multiple medication trials to improve both the integrity, elasticity, and decrease the pain associated with dryness. Premarin was previously too strong with her and caused burning, even so when they switched to a compound without alcohol but her GD points out., the patient had an active UTI then.  We discussed ways to both heal the tissue and prevent infection.  Patient feels comfortable using Vaseline to vaginal area.  I have asked her not to put anything else on the vaginal area with the exception of estrogen, hjydrocortisone 1%, and/or vaseline.  In addition, recommended not scrubbing but gently wiping   Problem List    Problem List: 2021-04: Vaginitis, atrophic 2021-03: Need for prophylactic vaccination against Streptococcus  pneumoniae (pneumococcus) 2021-03: Vaginitis and vulvovaginitis 2021-03: Chronic UTI 2019-09: Dysphagia 2019-09: Abnormal barium swallow 2019-09: Gastroesophageal reflux disease 2019-08: OAB (overactive bladder) 2019-06: Pharyngeal dysphagia 2018-11: Urinary incontinence in female 2018-11: Pelvic relaxation 2018-11: Dysuria 2018-11: Vaginal atrophy 2016-01:  Essential hypertension 2016-01: Thyroid activity decreased 2015-04: Edema 2013-10: Anemia 2013-10: Arthritis 2013-10: B12 deficiency 2013-10: Gastro-esophageal reflux disease with esophagitis 2013-10: Hyperlipidemia 2013-10: Seasonal allergies   Allergies   is allergic to brimonidine; codeine; levofloxacin; lidocaine; meloxicam; oxycodone; pregabalin; sulfa antibiotics; timolol maleate; and zolpidem.  Medications    Current Outpatient Medications:  .  ALPRAZolam (XANAX) 0.25 MG tablet, Take 1 tablet (0.25 mg total) by mouth 3 (three) times daily., Disp: 90 tablet, Rfl: 0 .  BC FAST PAIN RELIEF 650-195-33.3 MG PACK, Take 1 packet by mouth daily as needed (for pain.)., Disp: , Rfl:  .  betamethasone valerate ointment (VALISONE) 0.1 %, 1 application., Disp: , Rfl:  .  bisacodyl (DULCOLAX) 5 MG EC tablet, Take 5 mg by mouth daily as needed (for constipation.). , Disp: , Rfl:  .  castor oil liquid, Take 15 mLs by mouth daily as needed for moderate constipation., Disp: , Rfl:  .  CEQUA 0.09 % SOLN, Apply 1 drop to eye 2 (two) times daily., Disp: , Rfl:  .  cetirizine (ZYRTEC) 10 MG tablet, Take 1 tablet (10 mg total) by mouth daily., Disp: 90 tablet, Rfl: 3 .  Cholecalciferol (VITAMIN D3) 50 MCG (2000 UT) TABS, Take 2,000 Units by mouth daily., Disp: , Rfl:  .  docusate sodium (DULCOLAX) 100 MG capsule, Take 100 mg by mouth 2 (two) times daily as needed (for constipation). , Disp: , Rfl:  .  esomeprazole (NEXIUM) 40 MG capsule, Take 1 capsule (40 mg total) by mouth 2 (two) times daily., Disp: 60 capsule, Rfl: 6 .  fluconazole (DIFLUCAN) 150 MG tablet, I tab q 3 days, Disp: 3 tablet, Rfl: 1 .  fluconazole (DIFLUCAN) 200 MG tablet, Take  1 tablet by mouth every other day., Disp: , Rfl:  .  Ginkgo Biloba 120 MG CAPS, Take 120 mg by mouth daily., Disp: , Rfl:  .  hydrocortisone cream 1 %, Apply 1 application topically 2 (two) times daily., Disp: , Rfl:  .  Lactobacillus (PROBIOTIC ACIDOPHILUS  PO), Take 1 capsule by mouth daily., Disp: , Rfl:  .  levothyroxine (SYNTHROID) 75 MCG tablet, TAKE 1 TABLET BY MOUTH EVERY DAY, Disp: 90 tablet, Rfl: 2 .  Liniments (SALONPAS PAIN RELIEF PATCH EX), Place 1 patch onto the skin daily as needed (for shoulder pain.)., Disp: , Rfl:  .  loperamide (IMODIUM) 2 MG capsule, Take by mouth as needed for diarrhea or loose stools., Disp: , Rfl:  .  losartan (COZAAR) 100 MG tablet, TAKE 1 TABLET BY MOUTH EVERY DAY, Disp: 90 tablet, Rfl: 2 .  Magnesium 500 MG TABS, Take 500 mg by mouth 3 (three) times a week., Disp: , Rfl:  .  Menthol, Topical Analgesic, 154 MG PADS, Place 1 each onto the skin daily as needed (for hip pain.)., Disp: , Rfl:  .  pantoprazole (PROTONIX) 40 MG tablet, , Disp: , Rfl:  .  phenazopyridine (PYRIDIUM) 200 MG tablet, Take 1 tablet (200 mg total) by mouth 3 (three) times daily as needed for pain (urethral spasm)., Disp: 10 tablet, Rfl: 1 .  PROLIA 60 MG/ML SOSY injection, Take 60 mg by mouth every 6 (six) months., Disp: , Rfl: 0 .  Pumpkin Seed-Soy Germ (AZO BLADDER CONTROL/GO-LESS PO), Take 1 tablet by mouth daily as needed (for bladder pain.)., Disp: , Rfl:  .  RESTASIS 0.05 % ophthalmic emulsion, , Disp: , Rfl:  .  traMADol (ULTRAM) 50 MG tablet, Take 50 mg by mouth every 6 (six) hours as needed (pain)., Disp: , Rfl:  .  triamcinolone (KENALOG) 0.025 % ointment, Apply 1 application topically 2 (two) times daily., Disp: 30 g, Rfl: 0 .  trolamine salicylate (ASPERCREME) 10 % cream, Apply 1 application topically 3 (three) times daily as needed for muscle pain., Disp: , Rfl:  .  vitamin C (ASCORBIC ACID) 500 MG tablet, Take 500 mg by mouth daily., Disp: , Rfl:  .  cephALEXin (KEFLEX) 125 MG/5ML suspension, Take 5 mLs (125 mg total) by mouth daily., Disp: 100 mL, Rfl: 1 .  cephALEXin (KEFLEX) 500 MG capsule, Take 1 capsule (500 mg total) by mouth 2 (two) times daily for 7 days., Disp: 14 capsule, Rfl: 1 .  ciprofloxacin (CIPRO) 250 MG  tablet, , Disp: , Rfl:  .  ferrous sulfate 325 (65 FE) MG EC tablet, Take by mouth., Disp: , Rfl:  .  FLUAD QUADRIVALENT 0.5 ML injection, , Disp: , Rfl:  .  magnesium gluconate (MAGONATE) 500 MG tablet, Take by mouth., Disp: , Rfl:  .  predniSONE 5 MG TBEC, Take 1 tablet by mouth daily., Disp: 30 tablet, Rfl: 0 .  silver sulfADIAZINE (SILVADENE) 1 % cream, Apply to affected area 6 times daily as needed, Disp: , Rfl:  .  simvastatin (ZOCOR) 40 MG tablet, Take 1 tablet (40 mg total) by mouth daily., Disp: 30 tablet, Rfl: 0 .  terconazole (TERAZOL 7) 0.4 % vaginal cream, , Disp: , Rfl:    Review of Systems    Constitutional: Negative for activity change, appetite change, chills and fever.  HENT: Negative for congestion, nosebleeds, trouble swallowing and voice change.   Respiratory: Negative for cough, shortness of breath and wheezing.   Cardiac:  Negative for chest  pain, pressure, syncope  Gastrointestinal: Negative for diarrhea, nausea and vomiting.  Genitourinary: Negative for difficulty urinating, dysuria, flank pain and hematuria.  Musculoskeletal: Negative for back pain, joint swelling and neck pain.  Neurological: Negative for dizziness, speech difficulty, light-headedness and numbness.  See HPI. All other review of systems negative.     Physical Exam:    vitals were not taken for this visit.   Physical Examination: General appearance - alert, well appearing, and in no distress and oriented to person, place, and time Mental status - normal mood, behavior, speech, dress, motor activity, and thought processes Eyes - PERRL. Extraocular movements intact.  No nystagmus.  Neck - supple, no significant adenopathy, carotids upstroke normal bilaterally, no bruits, thyroid exam: thyroid is normal in size without nodules or tenderness Chest - clear to auscultation, no wheezes, rales or rhonchi, symmetric air entry  Heart - normal rate, regular rhythm, normal S1, S2, no murmurs, rubs, clicks  or gallops Extremities - dependent LE edema without clubbing or cyanosis Skin - normal coloration and turgor, no rashes, no suspicious skin lesions noted  No hyperpigmentation of skin.  No current hematomas noted   Lab /Imaging Review      Assessment & Plan:  Kayla Lloyd is a 86 y.o. female    1. Chronic UTI   2. Need for prophylactic vaccination against Streptococcus pneumoniae (pneumococcus)   3. Vaginitis, atrophic   4. OAB (overactive bladder)   5. Dysuria   6. Vaginitis and vulvovaginitis    No orders of the defined types were placed in this encounter.  Meds ordered this encounter  Medications  . cephALEXin (KEFLEX) 500 MG capsule    Sig: Take 1 capsule (500 mg total) by mouth 2 (two) times daily for 7 days.    Dispense:  14 capsule    Refill:  1  . cephALEXin (KEFLEX) 125 MG/5ML suspension    Sig: Take 5 mLs (125 mg total) by mouth daily.    Dispense:  100 mL    Refill:  1  . predniSONE 5 MG TBEC    Sig: Take 1 tablet by mouth daily.    Dispense:  30 tablet    Refill:  0     Glyn Ade, NP

## 2019-05-02 NOTE — Telephone Encounter (Signed)
Pharmacy called stating that 2 prescriptions for pts keflex was sent in. Pharmacy is requesting clarification. Please advise.    [facility] - [place], [place] - [address]  [address] [place] [address]  Phone: [phone] Fax: [phone]  Not a 24 hour pharmacy; exact hours not known.

## 2019-05-02 NOTE — Telephone Encounter (Signed)
Pharmacy has been contacted regarding clarification

## 2019-05-03 NOTE — Telephone Encounter (Signed)
Is this pt supposed to be taking the capsules of keflex as well or is just the liquid sufficient? Pharmacy called

## 2019-05-03 NOTE — Telephone Encounter (Signed)
Pharmacy called stating that the pt called and that the pt stated she is only supposed to be taking the liquid keflex. Pharmacy is requesting further instructions. Please advise.

## 2019-05-09 ENCOUNTER — Ambulatory Visit: Payer: Medicare HMO | Admitting: Family Medicine

## 2019-05-14 ENCOUNTER — Other Ambulatory Visit: Payer: Self-pay

## 2019-05-14 DIAGNOSIS — E039 Hypothyroidism, unspecified: Secondary | ICD-10-CM

## 2019-05-14 DIAGNOSIS — K219 Gastro-esophageal reflux disease without esophagitis: Secondary | ICD-10-CM

## 2019-05-14 MED ORDER — PANTOPRAZOLE SODIUM 40 MG PO TBEC: 40.0000 mg | DELAYED_RELEASE_TABLET | Freq: Two times a day (BID) | ORAL | 3 refills | Status: DC

## 2019-05-14 MED ORDER — LEVOTHYROXINE SODIUM 75 MCG PO TABS: 75.0000 ug | ORAL_TABLET | Freq: Every day | ORAL | 3 refills | Status: DC

## 2019-05-15 DIAGNOSIS — M810 Age-related osteoporosis without current pathological fracture: Secondary | ICD-10-CM | POA: Diagnosis not present

## 2019-05-21 ENCOUNTER — Encounter: Payer: Self-pay | Admitting: Family Medicine

## 2019-05-21 NOTE — Telephone Encounter (Signed)
I have no idea about the antibiotic.  Last visit was with Jens Som.

## 2019-05-21 NOTE — Telephone Encounter (Signed)
Pt wants to know if they should continue liquid ABX until follow up or discontinue sooner?

## 2019-05-22 NOTE — Telephone Encounter (Signed)
Pt wants to know if she should continue with the liquid abx or discontinue sooner?

## 2019-05-28 ENCOUNTER — Other Ambulatory Visit: Payer: Self-pay | Admitting: Family Medicine

## 2019-05-28 NOTE — Telephone Encounter (Signed)
Patient is requesting a refill of the following medications: Requested Prescriptions   Pending Prescriptions Disp Refills  . traMADol (ULTRAM) 50 MG tablet [Pharmacy Med Name: tramadol 50 mg tablet] 60 tablet 1    Sig: TAKE 1 TABLET BY MOUTH 3 TIMES DAILY AS NEEDED FOR PAIN    Date of patient request: 05/28/2019 Last office visit: 04/24/2019 Date of last refill:  Last refill amount:  Follow up time period per chart: 10/19/2019

## 2019-05-30 ENCOUNTER — Encounter: Payer: Medicare HMO | Admitting: Adult Health Nurse Practitioner

## 2019-05-30 ENCOUNTER — Ambulatory Visit: Payer: Medicare HMO | Admitting: Adult Health Nurse Practitioner

## 2019-05-30 ENCOUNTER — Telehealth: Payer: Self-pay | Admitting: Family Medicine

## 2019-05-30 NOTE — Telephone Encounter (Signed)
Called pt to let her know that Kayla Lloyd will be out of the office today 05/30/19 for her two appts 2-3 week f/u and her toc. Pt stated she will have to talk to her daughter because she is the one that is her transportation.

## 2019-06-04 ENCOUNTER — Telehealth: Payer: Self-pay | Admitting: Family Medicine

## 2019-06-04 NOTE — Telephone Encounter (Signed)
Medication Refill - Medication: predniSONE 5 MG TBEC cephALEXin (KEFLEX) 500 MG capsule    Has the patient contacted their pharmacy? Yes.   (Agent: If no, request that the patient contact the pharmacy for the refill.) (Agent: If yes, when and what did the pharmacy advise?)  Preferred Pharmacy (with phone number or street name):  [facility] - [place], [place] - [address]  [address] [place] [address]  Phone: [phone] Fax: [phone]     Agent: Please be advised that RX refills may take up to 3 business days. We ask that you follow-up with your pharmacy.

## 2019-06-04 NOTE — Telephone Encounter (Signed)
Keflex and Prednisone are not on pt.'s medication list. Please advise.

## 2019-06-05 ENCOUNTER — Encounter: Payer: Self-pay | Admitting: Family Medicine

## 2019-06-05 ENCOUNTER — Telehealth: Payer: Self-pay

## 2019-06-05 DIAGNOSIS — M25551 Pain in right hip: Secondary | ICD-10-CM | POA: Diagnosis not present

## 2019-06-05 DIAGNOSIS — Z79899 Other long term (current) drug therapy: Secondary | ICD-10-CM | POA: Diagnosis not present

## 2019-06-05 DIAGNOSIS — M810 Age-related osteoporosis without current pathological fracture: Secondary | ICD-10-CM | POA: Diagnosis not present

## 2019-06-05 DIAGNOSIS — H02055 Trichiasis without entropian left lower eyelid: Secondary | ICD-10-CM | POA: Diagnosis not present

## 2019-06-05 DIAGNOSIS — M1990 Unspecified osteoarthritis, unspecified site: Secondary | ICD-10-CM | POA: Diagnosis not present

## 2019-06-05 DIAGNOSIS — M2550 Pain in unspecified joint: Secondary | ICD-10-CM | POA: Diagnosis not present

## 2019-06-05 DIAGNOSIS — M7061 Trochanteric bursitis, right hip: Secondary | ICD-10-CM | POA: Diagnosis not present

## 2019-06-05 NOTE — Telephone Encounter (Signed)
After review of pt chart by Dr. Pamella Pert, it is the doctors medical opinion that this pt should not continue the prednisone. Pt was told to follow up with gyn. Appt made for est care 06/26/2019@ 1120

## 2019-06-05 NOTE — Telephone Encounter (Signed)
Pt requesting refill of the prednisone and liquid abx for her chronic UTI was supposed to have a follow up on 05/30/2019 for this but Felton Clinton was out of office should she follow up in office for refills or can we provide her more?

## 2019-06-06 ENCOUNTER — Encounter: Payer: Self-pay | Admitting: Family Medicine

## 2019-06-08 ENCOUNTER — Encounter: Payer: Self-pay | Admitting: Family Medicine

## 2019-06-08 NOTE — Telephone Encounter (Signed)
Called and spoke with patient.

## 2019-06-13 ENCOUNTER — Other Ambulatory Visit: Payer: Self-pay | Admitting: Family Medicine

## 2019-06-13 NOTE — Telephone Encounter (Signed)
Pts granddaughter called stating that the pt will be going out of town on Saturday and is requesting to have the medication, alprazolam,  refilled for Friday. Please advise.

## 2019-06-13 NOTE — Telephone Encounter (Signed)
Patient is requesting a refill of the following medications: Requested Prescriptions   Pending Prescriptions Disp Refills  . ALPRAZolam (XANAX) 0.25 MG tablet 90 tablet 0    Sig: Take 1 tablet (0.25 mg total) by mouth 3 (three) times daily.    Date of patient request: 06/13/19 Last office visit: 03/05/19 Date of last refill: 04/10/19 Last refill amount: #90 Follow up time period per chart: n/a

## 2019-06-13 NOTE — Telephone Encounter (Signed)
Pt request sent to stallings box for review.

## 2019-06-26 ENCOUNTER — Other Ambulatory Visit: Payer: Self-pay

## 2019-06-26 ENCOUNTER — Ambulatory Visit: Payer: Medicare HMO | Admitting: Adult Health Nurse Practitioner

## 2019-06-26 ENCOUNTER — Encounter: Payer: Medicare HMO | Admitting: Adult Health Nurse Practitioner

## 2019-06-26 ENCOUNTER — Ambulatory Visit (INDEPENDENT_AMBULATORY_CARE_PROVIDER_SITE_OTHER): Payer: Medicare HMO | Admitting: Family Medicine

## 2019-06-26 ENCOUNTER — Encounter: Payer: Self-pay | Admitting: Family Medicine

## 2019-06-26 VITALS — BP 111/70 | HR 70 | Temp 97.3°F | Ht 59.0 in | Wt 112.0 lb

## 2019-06-26 DIAGNOSIS — E039 Hypothyroidism, unspecified: Secondary | ICD-10-CM | POA: Diagnosis not present

## 2019-06-26 DIAGNOSIS — D508 Other iron deficiency anemias: Secondary | ICD-10-CM

## 2019-06-26 DIAGNOSIS — E538 Deficiency of other specified B group vitamins: Secondary | ICD-10-CM | POA: Diagnosis not present

## 2019-06-26 DIAGNOSIS — M25511 Pain in right shoulder: Secondary | ICD-10-CM

## 2019-06-26 DIAGNOSIS — I10 Essential (primary) hypertension: Secondary | ICD-10-CM | POA: Diagnosis not present

## 2019-06-26 DIAGNOSIS — G8929 Other chronic pain: Secondary | ICD-10-CM | POA: Diagnosis not present

## 2019-06-26 DIAGNOSIS — E782 Mixed hyperlipidemia: Secondary | ICD-10-CM

## 2019-06-26 DIAGNOSIS — R945 Abnormal results of liver function studies: Secondary | ICD-10-CM | POA: Diagnosis not present

## 2019-06-26 DIAGNOSIS — N390 Urinary tract infection, site not specified: Secondary | ICD-10-CM

## 2019-06-26 DIAGNOSIS — R7989 Other specified abnormal findings of blood chemistry: Secondary | ICD-10-CM

## 2019-06-26 LAB — POCT URINALYSIS DIP (MANUAL ENTRY)
Bilirubin, UA: NEGATIVE
Blood, UA: NEGATIVE
Glucose, UA: NEGATIVE mg/dL
Ketones, POC UA: NEGATIVE mg/dL
Leukocytes, UA: NEGATIVE
Nitrite, UA: NEGATIVE
Protein Ur, POC: NEGATIVE mg/dL
Spec Grav, UA: 1.02 (ref 1.010–1.025)
Urobilinogen, UA: 0.2 E.U./dL
pH, UA: 7 (ref 5.0–8.0)

## 2019-06-26 MED ORDER — ALPRAZOLAM 0.25 MG PO TABS: 0.2500 mg | ORAL_TABLET | Freq: Three times a day (TID) | ORAL | 5 refills | Status: DC

## 2019-06-26 MED ORDER — TRAMADOL HCL 50 MG PO TABS: ORAL_TABLET | ORAL | 2 refills | Status: DC

## 2019-06-26 NOTE — Patient Instructions (Signed)
° ° ° °  If you have lab work done today you will be contacted with your lab results within the next 2 weeks.  If you have not heard from us then please contact us. The fastest way to get your results is to register for My Chart. ° ° °IF you received an x-ray today, you will receive an invoice from [facility]. Please contact [facility] at [phone] with questions or concerns regarding your invoice.  ° °IF you received labwork today, you will receive an invoice from [facility]. Please contact [facility] at [phone] with questions or concerns regarding your invoice.  ° °Our billing staff will not be able to assist you with questions regarding bills from these companies. ° °You will be contacted with the lab results as soon as they are available. The fastest way to get your results is to activate your My Chart account. Instructions are located on the last page of this paperwork. If you have not heard from us regarding the results in 2 weeks, please contact this office. °  ° ° ° °

## 2019-06-26 NOTE — Progress Notes (Signed)
6/1/202112:02 PM  [name] 07/02/1923, 86 y.o., female [id number]  Chief Complaint  Patient presents with  . Weight Loss    does more snacking than eating full meals  . Pain    thinks she may have uti, having pain in the right side    HPI:   Patient is a 86 y.o. female with past medical history significant for HTN, HLP, b12 deficiency, arthritis. Osteoporosis, hypothyroidism, GERD, OAB with vaginal atrophy, plantar fasscitis who presents today to [facility] care  previous PCP Dr Dante Gang obgyn - using premarin  Started on daily keflex by Felton Clinton, NP Last positive urine cx - feb 25,000 cfu k. Pneumoniae In the past tried on gabapentin and migreberon - reports rashes, uses azo prn  prolia rx by rheum Right trochanteric bursitis injections with rheum Takes tramadol for bursitis and right shoulder prn Uses BC powder 2-3 week also helps  Alprazolam 0.25mg  TID has been on it for most of her adult life She shakes if she does not take it pmp reviewed  Prednisone started by Felton Clinton, NP for unclear reasons  Right shoulder pain, decreased ROM Had fall in march 2021, no fx then Does have remote h/o fracture Muscle spasms inferior axilla and shoulder blade  Uses a cane  Here with granddaughter - caregiver   Lab Results  Component Value Date   CREATININE 1.03 (H) 10/18/2018   BUN 16 10/18/2018   NA 138 10/18/2018   K 4.6 10/18/2018   CL 101 10/18/2018   CO2 24 10/18/2018   Lab Results  Component Value Date   CHOL 189 10/18/2018   HDL 49 10/18/2018   LDLCALC 129 (H) 10/18/2018   TRIG 60 10/18/2018   CHOLHDL 3.9 10/18/2018   Lab Results  Component Value Date   ALT 32 10/18/2018   AST 17 10/18/2018   ALKPHOS 88 10/18/2018   BILITOT 0.7 10/18/2018   Lab Results  Component Value Date   TSH 1.680 10/18/2018   Lab Results  Component Value Date   WBC 6.1 10/13/2017   HGB 13.0 10/13/2017   HCT 39.0 10/13/2017   MCV 94.8 10/13/2017   PLT 148.0  (L) 10/13/2017   No results found for: VITAMINB12  Depression screen [facility] 2/9 04/24/2019 03/20/2019 03/05/2019  Decreased Interest 0 2 0  Down, Depressed, Hopeless 0 3 3  PHQ - 2 Score 0 5 3  Altered sleeping 0 3 3  Tired, decreased energy 0 3 3  Change in appetite - 1 1  Feeling bad or failure about yourself  0 2 3  Trouble concentrating 0 3 0  Moving slowly or fidgety/restless 0 2 3  Suicidal thoughts 0 0 0  PHQ-9 Score 0 19 16  Difficult doing work/chores Not difficult at all Not difficult at all Somewhat difficult    Fall Risk  06/26/2019 04/24/2019 03/20/2019 03/05/2019 02/05/2019  Falls in the past year? 0 0 0 0 1  Number falls in past yr: 0 0 - 0 1  Injury with Fall? 0 0 0 0 0  Follow up - Falls evaluation completed Falls evaluation completed Falls evaluation completed Falls evaluation completed     Allergies  Allergen Reactions  . Brimonidine   . Codeine Other (See Comments)  . Levofloxacin Other (See Comments)  . Lidocaine   . Meloxicam   . Oxycodone   . Pregabalin   . Sulfa Antibiotics Other (See Comments)  . Timolol Maleate   . Zolpidem     Prior to  Admission medications   Medication Sig Start Date End Date Taking? Authorizing Provider  ALPRAZolam (XANAX) 0.25 MG tablet TAKE 1 TABLET BY MOUTH 3 TIMES DAILY 06/14/19  Yes Delia Chimes A, MD  BC FAST PAIN RELIEF [phone] MG PACK Take 1 packet by mouth daily as needed (for pain.).   Yes [provider]  betamethasone valerate ointment (VALISONE) 0.1 % 1 application. 02/28/19  Yes [provider]  bisacodyl (DULCOLAX) 5 MG EC tablet Take 5 mg by mouth daily as needed (for constipation.).  07/13/16  Yes [provider]  castor oil liquid Take 15 mLs by mouth daily as needed for moderate constipation.   Yes [provider]  cephALEXin (KEFLEX) 125 MG/5ML suspension  06/13/19  Yes [provider]  CEQUA 0.09 % SOLN Apply 1 drop to eye 2 (two) times daily. 10/10/18  Yes [provider]  cetirizine (ZYRTEC) 10 MG tablet Take 1 tablet (10 mg total) by mouth daily. 01/04/19  Yes Forrest Moron, MD  Cholecalciferol (VITAMIN D3) 50 MCG (2000 UT) TABS Take 2,000 Units by mouth daily.   Yes [provider]  ciprofloxacin (CIPRO) 250 MG tablet  04/02/19  Yes [provider]  docusate sodium (DULCOLAX) 100 MG capsule Take 100 mg by mouth 2 (two) times daily as needed (for constipation).  07/13/16  Yes [provider]  esomeprazole (NEXIUM) 40 MG capsule Take 1 capsule (40 mg total) by mouth 2 (two) times daily. 01/03/18  Yes Mansouraty, Telford Nab., MD  ferrous sulfate 325 (65 FE) MG EC tablet Take by mouth.   Yes [provider]  FLUAD QUADRIVALENT 0.5 ML injection  12/22/18  Yes [provider]  Ginkgo Biloba 120 MG CAPS Take 120 mg by mouth daily.   Yes [provider]  hydrocortisone cream 1 % Apply 1 application topically 2 (two) times daily.   Yes [provider]  Lactobacillus (PROBIOTIC ACIDOPHILUS PO) Take 1 capsule by mouth daily.   Yes [provider]  levothyroxine (SYNTHROID) 75 MCG tablet TAKE 1 TABLET BY MOUTH EVERY DAY 04/02/19  Yes Stallings, Zoe A, MD  Liniments (SALONPAS PAIN RELIEF PATCH EX) Place 1 patch onto the skin daily as needed (for shoulder pain.).   Yes [provider]  loperamide (IMODIUM) 2 MG capsule Take by mouth as needed for diarrhea or loose stools.   Yes [provider]  losartan (COZAAR) 100 MG tablet TAKE 1 TABLET BY MOUTH EVERY DAY 04/03/19  Yes Stallings, Zoe A, MD  Magnesium 500 MG TABS Take 500 mg by mouth 3 (three) times a week.   Yes [provider]  magnesium gluconate (MAGONATE) 500 MG tablet Take by mouth.   Yes [provider]  Menthol, Topical Analgesic, 154 MG PADS Place 1 each onto the skin daily as needed (for hip pain.).   Yes [provider]  pantoprazole (PROTONIX) 40 MG tablet Take 1 tablet (40 mg total) by  mouth 2 (two) times daily. 05/14/19  Yes Forrest Moron, MD  phenazopyridine (PYRIDIUM) 200 MG tablet Take 1 tablet (200 mg total) by mouth 3 (three) times daily as needed for pain (urethral spasm). 03/02/18  Yes Constant, Peggy, MD  PROLIA 60 MG/ML SOSY injection Take 60 mg by mouth every 6 (six) months. 09/15/17  Yes [provider]  Pumpkin Seed-Soy Germ (AZO BLADDER CONTROL/GO-LESS PO) Take 1 tablet by mouth daily as needed (for bladder pain.).   Yes [provider]  RESTASIS 0.05 %  ophthalmic emulsion  10/18/18  Yes [provider]  silver sulfADIAZINE (SILVADENE) 1 % cream Apply to affected area 6 times daily as needed 03/16/19  Yes [provider]  simvastatin (ZOCOR) 40 MG tablet Take 1 tablet (40 mg total) by mouth daily. 01/04/19  Yes Delia Chimes A, MD  terconazole (TERAZOL 7) 0.4 % vaginal cream  04/19/19  Yes [provider]  traMADol (ULTRAM) 50 MG tablet TAKE 1 TABLET BY MOUTH 3 TIMES DAILY AS NEEDED FOR PAIN 05/29/19  Yes Stallings, Zoe A, MD  triamcinolone (KENALOG) 0.025 % ointment Apply 1 application topically 2 (two) times daily. 02/05/19  Yes Forrest Moron, MD  trolamine salicylate (ASPERCREME) 10 % cream Apply 1 application topically 3 (three) times daily as needed for muscle pain.   Yes [provider]  vitamin C (ASCORBIC ACID) 500 MG tablet Take 500 mg by mouth daily.   Yes [provider]    Past Medical History:  Diagnosis Date  . Bowel obstruction ([place]) 1950  . Cancer of septum of nose ([facility])    melanoma  . Hypertension   . Hypothyroidism     Past Surgical History:  Procedure Laterality Date  . BIOPSY  12/07/2017   Procedure: BIOPSY;  Surgeon: Rush Landmark Telford Nab., MD;  Location: [facility];  Service: Gastroenterology;;  . CHOLECYSTECTOMY    . ESOPHAGOGASTRODUODENOSCOPY (EGD) WITH PROPOFOL N/A 12/07/2017   Procedure: ESOPHAGOGASTRODUODENOSCOPY (EGD) WITH PROPOFOL;  Surgeon: Rush Landmark Telford Nab., MD;  Location: [facility];  Service: Gastroenterology;  Laterality: N/A;  May need Dilation  . hysterecrtomy    . left hip surgery    . ROTATOR CUFF REPAIR Right   . SMALL INTESTINE SURGERY      Social History   Tobacco Use  . Smoking status: Never Smoker  . Smokeless tobacco: Never Used  Substance Use Topics  . Alcohol use: No    Comment: wine/rare    Family History  Problem Relation Age of Onset  . Uterine cancer Mother   . Hypertension Father   . Colon cancer Neg Hx   . Esophageal cancer Neg Hx   . Liver disease Neg Hx   . Inflammatory bowel disease Neg Hx   . Pancreatic cancer Neg Hx   . Rectal cancer Neg Hx   . Stomach cancer Neg Hx     Review of Systems  Constitutional: Negative for chills and fever.  Respiratory: Negative for cough and shortness of breath.   Cardiovascular: Negative for chest pain, palpitations and leg swelling.  Gastrointestinal: Negative for abdominal pain, nausea and vomiting.     OBJECTIVE:  Today's Vitals   06/26/19 1144  BP: 111/70  Pulse: 70  Temp: (!) 97.3 F (36.3 C)  SpO2: 95%  Weight: 112 lb (50.8 kg)  Height: 4\' 11"  (1.499 m)   Body mass index is 22.62 kg/m.  Wt Readings from Last 3 Encounters:  06/26/19 112 lb (50.8 kg)  03/20/19 117 lb (53.1 kg)  03/05/19 117 lb (53.1 kg)    Physical Exam Vitals and nursing note reviewed.  Constitutional:      Appearance: She is well-developed.  HENT:     Head: Normocephalic and atraumatic.     Mouth/Throat:     Pharynx: No oropharyngeal exudate.  Eyes:     General: No scleral icterus.    Conjunctiva/sclera: Conjunctivae normal.     Pupils: Pupils are equal, round, and reactive to light.  Cardiovascular:     Rate and Rhythm: Normal rate  and regular rhythm.     Heart sounds: Normal heart sounds. No murmur. No friction rub. No gallop.   Pulmonary:     Effort: Pulmonary effort is normal.     Breath sounds: Normal breath sounds. No wheezing or rales.   Musculoskeletal:     Right shoulder: Tenderness present. Decreased range of motion. Normal pulse.     Cervical back: Neck supple.     Thoracic back: Spasms and tenderness present.     Right lower leg: No edema.     Left lower leg: No edema.  Skin:    General: Skin is warm and dry.  Neurological:     Mental Status: She is alert and oriented to person, place, and time.     Results for orders placed or performed in visit on 06/26/19 (from the past 24 hour(s))  POCT urinalysis dipstick     Status: None   Collection Time: 06/26/19 11:58 AM  Result Value Ref Range   Color, UA yellow yellow   Clarity, UA clear clear   Glucose, UA negative negative mg/dL   Bilirubin, UA negative negative   Ketones, POC UA negative negative mg/dL   Spec Grav, UA 1.020 1.010 - 1.025   Blood, UA negative negative   pH, UA 7.0 5.0 - 8.0   Protein Ur, POC negative negative mg/dL   Urobilinogen, UA 0.2 0.2 or 1.0 E.U./dL   Nitrite, UA Negative Negative   Leukocytes, UA Negative Negative    No results found.   ASSESSMENT and PLAN  1. Chronic UTI UA normal. Cont with vag estrogen, prn azo, daily suppressive abx therapy - POCT urinalysis dipstick  2. Essential hypertension Controlled. Continue current regime.  - Comprehensive metabolic panel  3. B12 deficiency - Vitamin B12  4. Iron deficiency anemia secondary to inadequate dietary iron intake - CBC - Iron, TIBC and Ferritin Panel  5. Acquired hypothyroidism Checking labs today, medications will be adjusted as needed.  - TSH  6. Chronic right shoulder pain - Ambulatory referral to Home Health  7. Mixed hyperlipidemia Checking labs today, medications will be adjusted as needed.  - Lipid panel  Discussed concerns for longterm bzd, risk of withdrawals outweigh other concerns, reviewed r/se/b   Other orders - cephALEXin (KEFLEX) 125 MG/5ML suspension - ALPRAZolam (XANAX) 0.25 MG tablet; Take 1 tablet (0.25 mg total) by mouth 3 (three)  times daily. - traMADol (ULTRAM) 50 MG tablet; TAKE 1 TABLET BY MOUTH 3 TIMES DAILY AS NEEDED FOR PAIN  Return in about 3 months (around 09/26/2019).    Rutherford Guys, MD [facility] at [place], [place] [address] Ph.  [phone] Fax [phone]

## 2019-06-27 LAB — IRON,TIBC AND FERRITIN PANEL
Ferritin: 145 ng/mL (ref 15–150)
Iron Saturation: 29 % (ref 15–55)
Iron: 78 ug/dL (ref 27–139)
Total Iron Binding Capacity: 271 ug/dL (ref 250–450)
UIBC: 193 ug/dL (ref 118–369)

## 2019-06-27 LAB — COMPREHENSIVE METABOLIC PANEL
ALT: 245 IU/L — ABNORMAL HIGH (ref 0–32)
AST: 266 IU/L — ABNORMAL HIGH (ref 0–40)
Albumin/Globulin Ratio: 1.9 (ref 1.2–2.2)
Albumin: 4.1 g/dL (ref 3.5–4.6)
Alkaline Phosphatase: 559 IU/L — ABNORMAL HIGH (ref 48–121)
BUN/Creatinine Ratio: 25 (ref 12–28)
BUN: 18 mg/dL (ref 10–36)
Bilirubin Total: 0.6 mg/dL (ref 0.0–1.2)
CO2: 24 mmol/L (ref 20–29)
Calcium: 9.1 mg/dL (ref 8.7–10.3)
Chloride: 95 mmol/L — ABNORMAL LOW (ref 96–106)
Creatinine, Ser: 0.73 mg/dL (ref 0.57–1.00)
GFR calc Af Amer: 82 mL/min/{1.73_m2} (ref >59)
GFR calc non Af Amer: 71 mL/min/{1.73_m2} (ref >59)
Globulin, Total: 2.2 g/dL (ref 1.5–4.5)
Glucose: 95 mg/dL (ref 65–99)
Potassium: 4.3 mmol/L (ref 3.5–5.2)
Sodium: 132 mmol/L — ABNORMAL LOW (ref 134–144)
Total Protein: 6.3 g/dL (ref 6.0–8.5)

## 2019-06-27 LAB — CBC
Hematocrit: 42.1 % (ref 34.0–46.6)
Hemoglobin: 13.9 g/dL (ref 11.1–15.9)
MCH: 31.4 pg (ref 26.6–33.0)
MCHC: 33 g/dL (ref 31.5–35.7)
MCV: 95 fL (ref 79–97)
Platelets: 185 10*3/uL (ref 150–450)
RBC: 4.42 x10E6/uL (ref 3.77–5.28)
RDW: 13.1 % (ref 11.7–15.4)
WBC: 7 10*3/uL (ref 3.4–10.8)

## 2019-06-27 LAB — VITAMIN B12: Vitamin B-12: >2000 pg/mL — ABNORMAL HIGH (ref 232–1245)

## 2019-06-27 LAB — LIPID PANEL
Chol/HDL Ratio: 2.7 ratio (ref 0.0–4.4)
Cholesterol, Total: 252 mg/dL — ABNORMAL HIGH (ref 100–199)
HDL: 94 mg/dL (ref >39)
LDL Chol Calc (NIH): 144 mg/dL — ABNORMAL HIGH (ref 0–99)
Triglycerides: 84 mg/dL (ref 0–149)
VLDL Cholesterol Cal: 14 mg/dL (ref 5–40)

## 2019-06-27 LAB — TSH: TSH: 0.716 u[IU]/mL (ref 0.450–4.500)

## 2019-06-30 ENCOUNTER — Encounter: Payer: Self-pay | Admitting: Family Medicine

## 2019-07-02 ENCOUNTER — Encounter: Payer: Self-pay | Admitting: Family Medicine

## 2019-07-02 DIAGNOSIS — M810 Age-related osteoporosis without current pathological fracture: Secondary | ICD-10-CM | POA: Diagnosis not present

## 2019-07-02 DIAGNOSIS — E538 Deficiency of other specified B group vitamins: Secondary | ICD-10-CM | POA: Diagnosis not present

## 2019-07-02 DIAGNOSIS — M7061 Trochanteric bursitis, right hip: Secondary | ICD-10-CM | POA: Diagnosis not present

## 2019-07-02 DIAGNOSIS — D509 Iron deficiency anemia, unspecified: Secondary | ICD-10-CM | POA: Diagnosis not present

## 2019-07-02 DIAGNOSIS — M1990 Unspecified osteoarthritis, unspecified site: Secondary | ICD-10-CM | POA: Diagnosis not present

## 2019-07-02 DIAGNOSIS — G8929 Other chronic pain: Secondary | ICD-10-CM | POA: Diagnosis not present

## 2019-07-02 DIAGNOSIS — M722 Plantar fascial fibromatosis: Secondary | ICD-10-CM | POA: Diagnosis not present

## 2019-07-02 DIAGNOSIS — I10 Essential (primary) hypertension: Secondary | ICD-10-CM | POA: Diagnosis not present

## 2019-07-02 DIAGNOSIS — M25511 Pain in right shoulder: Secondary | ICD-10-CM | POA: Diagnosis not present

## 2019-07-02 NOTE — Addendum Note (Signed)
Addended by: Rutherford Guys on: 07/02/2019 08:24 AM   Modules accepted: Orders

## 2019-07-02 NOTE — Telephone Encounter (Signed)
Pt is wondering if she should restart her simvastatin as her cholesterol is high a few days ago. Dr Nolon Rod had originally stopped her due to good numbers but they have since gotten higher. Please advise. Does she need a visit to discuss? Cholesterol, Total 10/18/2018  189  06/26/2019  252

## 2019-07-11 DIAGNOSIS — M722 Plantar fascial fibromatosis: Secondary | ICD-10-CM | POA: Diagnosis not present

## 2019-07-11 DIAGNOSIS — E538 Deficiency of other specified B group vitamins: Secondary | ICD-10-CM | POA: Diagnosis not present

## 2019-07-11 DIAGNOSIS — M7061 Trochanteric bursitis, right hip: Secondary | ICD-10-CM | POA: Diagnosis not present

## 2019-07-11 DIAGNOSIS — M810 Age-related osteoporosis without current pathological fracture: Secondary | ICD-10-CM | POA: Diagnosis not present

## 2019-07-11 DIAGNOSIS — I10 Essential (primary) hypertension: Secondary | ICD-10-CM | POA: Diagnosis not present

## 2019-07-11 DIAGNOSIS — D509 Iron deficiency anemia, unspecified: Secondary | ICD-10-CM | POA: Diagnosis not present

## 2019-07-11 DIAGNOSIS — M1990 Unspecified osteoarthritis, unspecified site: Secondary | ICD-10-CM | POA: Diagnosis not present

## 2019-07-11 DIAGNOSIS — M25511 Pain in right shoulder: Secondary | ICD-10-CM | POA: Diagnosis not present

## 2019-07-11 DIAGNOSIS — G8929 Other chronic pain: Secondary | ICD-10-CM | POA: Diagnosis not present

## 2019-07-12 DIAGNOSIS — Z8619 Personal history of other infectious and parasitic diseases: Secondary | ICD-10-CM | POA: Diagnosis not present

## 2019-07-12 DIAGNOSIS — N952 Postmenopausal atrophic vaginitis: Secondary | ICD-10-CM | POA: Diagnosis not present

## 2019-07-12 DIAGNOSIS — N898 Other specified noninflammatory disorders of vagina: Secondary | ICD-10-CM | POA: Diagnosis not present

## 2019-07-13 DIAGNOSIS — M722 Plantar fascial fibromatosis: Secondary | ICD-10-CM | POA: Diagnosis not present

## 2019-07-13 DIAGNOSIS — M7061 Trochanteric bursitis, right hip: Secondary | ICD-10-CM | POA: Diagnosis not present

## 2019-07-13 DIAGNOSIS — I10 Essential (primary) hypertension: Secondary | ICD-10-CM | POA: Diagnosis not present

## 2019-07-13 DIAGNOSIS — E538 Deficiency of other specified B group vitamins: Secondary | ICD-10-CM | POA: Diagnosis not present

## 2019-07-13 DIAGNOSIS — M25511 Pain in right shoulder: Secondary | ICD-10-CM | POA: Diagnosis not present

## 2019-07-13 DIAGNOSIS — G8929 Other chronic pain: Secondary | ICD-10-CM | POA: Diagnosis not present

## 2019-07-13 DIAGNOSIS — D509 Iron deficiency anemia, unspecified: Secondary | ICD-10-CM | POA: Diagnosis not present

## 2019-07-13 DIAGNOSIS — M810 Age-related osteoporosis without current pathological fracture: Secondary | ICD-10-CM | POA: Diagnosis not present

## 2019-07-13 DIAGNOSIS — M1990 Unspecified osteoarthritis, unspecified site: Secondary | ICD-10-CM | POA: Diagnosis not present

## 2019-07-16 DIAGNOSIS — I10 Essential (primary) hypertension: Secondary | ICD-10-CM | POA: Diagnosis not present

## 2019-07-16 DIAGNOSIS — G8929 Other chronic pain: Secondary | ICD-10-CM | POA: Diagnosis not present

## 2019-07-16 DIAGNOSIS — M25511 Pain in right shoulder: Secondary | ICD-10-CM | POA: Diagnosis not present

## 2019-07-16 DIAGNOSIS — D509 Iron deficiency anemia, unspecified: Secondary | ICD-10-CM | POA: Diagnosis not present

## 2019-07-16 DIAGNOSIS — M1990 Unspecified osteoarthritis, unspecified site: Secondary | ICD-10-CM | POA: Diagnosis not present

## 2019-07-16 DIAGNOSIS — M810 Age-related osteoporosis without current pathological fracture: Secondary | ICD-10-CM | POA: Diagnosis not present

## 2019-07-16 DIAGNOSIS — E538 Deficiency of other specified B group vitamins: Secondary | ICD-10-CM | POA: Diagnosis not present

## 2019-07-16 DIAGNOSIS — M722 Plantar fascial fibromatosis: Secondary | ICD-10-CM | POA: Diagnosis not present

## 2019-07-16 DIAGNOSIS — M7061 Trochanteric bursitis, right hip: Secondary | ICD-10-CM | POA: Diagnosis not present

## 2019-07-17 ENCOUNTER — Other Ambulatory Visit: Payer: Self-pay

## 2019-07-17 ENCOUNTER — Ambulatory Visit (INDEPENDENT_AMBULATORY_CARE_PROVIDER_SITE_OTHER): Payer: Medicare HMO | Admitting: Emergency Medicine

## 2019-07-17 DIAGNOSIS — R945 Abnormal results of liver function studies: Secondary | ICD-10-CM | POA: Diagnosis not present

## 2019-07-17 DIAGNOSIS — R7989 Other specified abnormal findings of blood chemistry: Secondary | ICD-10-CM

## 2019-07-17 LAB — COMPREHENSIVE METABOLIC PANEL
ALT: 91 IU/L — ABNORMAL HIGH (ref 0–32)
AST: 72 IU/L — ABNORMAL HIGH (ref 0–40)
Albumin/Globulin Ratio: 2.3 — ABNORMAL HIGH (ref 1.2–2.2)
Albumin: 4.3 g/dL (ref 3.5–4.6)
Alkaline Phosphatase: 686 IU/L — ABNORMAL HIGH (ref 48–121)
BUN/Creatinine Ratio: 16 (ref 12–28)
BUN: 11 mg/dL (ref 10–36)
Bilirubin Total: 0.6 mg/dL (ref 0.0–1.2)
CO2: 26 mmol/L (ref 20–29)
Calcium: 9.2 mg/dL (ref 8.7–10.3)
Chloride: 98 mmol/L (ref 96–106)
Creatinine, Ser: 0.69 mg/dL (ref 0.57–1.00)
GFR calc Af Amer: 87 mL/min/{1.73_m2} (ref >59)
GFR calc non Af Amer: 75 mL/min/{1.73_m2} (ref >59)
Globulin, Total: 1.9 g/dL (ref 1.5–4.5)
Glucose: 100 mg/dL — ABNORMAL HIGH (ref 65–99)
Potassium: 4.3 mmol/L (ref 3.5–5.2)
Sodium: 135 mmol/L (ref 134–144)
Total Protein: 6.2 g/dL (ref 6.0–8.5)

## 2019-07-22 ENCOUNTER — Encounter: Payer: Self-pay | Admitting: Family Medicine

## 2019-07-22 NOTE — Addendum Note (Signed)
Addended by: Rutherford Guys on: 07/22/2019 02:05 PM   Modules accepted: Orders

## 2019-07-25 ENCOUNTER — Other Ambulatory Visit: Payer: Self-pay

## 2019-07-25 ENCOUNTER — Encounter: Payer: Self-pay | Admitting: Podiatry

## 2019-07-25 ENCOUNTER — Ambulatory Visit (INDEPENDENT_AMBULATORY_CARE_PROVIDER_SITE_OTHER): Payer: Medicare HMO | Admitting: Podiatry

## 2019-07-25 DIAGNOSIS — B351 Tinea unguium: Secondary | ICD-10-CM | POA: Diagnosis not present

## 2019-07-25 DIAGNOSIS — M79674 Pain in right toe(s): Secondary | ICD-10-CM | POA: Diagnosis not present

## 2019-07-25 DIAGNOSIS — M79675 Pain in left toe(s): Secondary | ICD-10-CM

## 2019-07-25 DIAGNOSIS — L603 Nail dystrophy: Secondary | ICD-10-CM

## 2019-07-29 NOTE — Progress Notes (Signed)
Subjective: Kayla Lloyd is a pleasant 86 y.o. female patient seen today painful thick toenails that are difficult to trim. Pain interferes with ambulation. Aggravating factors include wearing enclosed shoe gear. Pain is relieved with periodic professional debridement.   She states she hit her left foot on something and cracked her left 4th digit nailplate. Her granddaughter trimmed the broken toenail.  Past Medical History:  Diagnosis Date  . Bowel obstruction ([place]) 1950  . Cancer of septum of nose ([facility])    melanoma  . Hypertension   . Hypothyroidism     Patient Active Problem List   Diagnosis Date Noted  . Vaginitis, atrophic 05/02/2019  . Need for prophylactic vaccination against Streptococcus pneumoniae (pneumococcus) 04/24/2019  . Vaginitis and vulvovaginitis 04/03/2019  . Chronic UTI 04/03/2019  . Dysphagia 10/17/2017  . Abnormal barium swallow 10/17/2017  . Gastroesophageal reflux disease 10/17/2017  . OAB (overactive bladder) 09/21/2017  . Pharyngeal dysphagia 07/11/2017  . Urinary incontinence in female 11/30/2016  . Pelvic relaxation 11/30/2016  . Dysuria 11/30/2016  . Vaginal atrophy 11/30/2016  . Essential hypertension 02/06/2014  . Thyroid activity decreased 02/06/2014  . Edema 05/03/2013  . Anemia 10/29/2011  . Arthritis 10/29/2011  . B12 deficiency 10/29/2011  . Gastro-esophageal reflux disease with esophagitis 10/29/2011  . Hyperlipidemia 10/29/2011  . Seasonal allergies 10/29/2011    Current Outpatient Medications on File Prior to Visit  Medication Sig Dispense Refill  . ALPRAZolam (XANAX) 0.25 MG tablet Take 1 tablet (0.25 mg total) by mouth 3 (three) times daily. 90 tablet 5  . BC FAST PAIN RELIEF 650-195-33.3 MG PACK Take 1 packet by mouth daily as needed (for pain.).    Marland Kitchen betamethasone valerate ointment (VALISONE) 0.1 % 1 application.    . bisacodyl (DULCOLAX) 5 MG EC tablet Take 5 mg by mouth daily as needed (for constipation.).     Marland Kitchen  castor oil liquid Take 15 mLs by mouth daily as needed for moderate constipation.    . cephALEXin (KEFLEX) 125 MG/5ML suspension     . CEQUA 0.09 % SOLN Apply 1 drop to eye 2 (two) times daily.    . cetirizine (ZYRTEC) 10 MG tablet Take 1 tablet (10 mg total) by mouth daily. 90 tablet 3  . Cholecalciferol (VITAMIN D3) 50 MCG (2000 UT) TABS Take 2,000 Units by mouth daily.    . clindamycin (CLEOCIN) 2 % vaginal cream     . docusate sodium (DULCOLAX) 100 MG capsule Take 100 mg by mouth 2 (two) times daily as needed (for constipation).     Marland Kitchen esomeprazole (NEXIUM) 40 MG capsule Take 1 capsule (40 mg total) by mouth 2 (two) times daily. 60 capsule 6  . ferrous sulfate 325 (65 FE) MG EC tablet Take by mouth.    Marland Kitchen FLUAD QUADRIVALENT 0.5 ML injection     . Ginkgo Biloba 120 MG CAPS Take 120 mg by mouth daily.    . hydrocortisone cream 1 % Apply 1 application topically 2 (two) times daily.    . Lactobacillus (PROBIOTIC ACIDOPHILUS PO) Take 1 capsule by mouth daily.    Marland Kitchen levothyroxine (SYNTHROID) 75 MCG tablet TAKE 1 TABLET BY MOUTH EVERY DAY 90 tablet 2  . Liniments (SALONPAS PAIN RELIEF PATCH EX) Place 1 patch onto the skin daily as needed (for shoulder pain.).    Marland Kitchen loperamide (IMODIUM) 2 MG capsule Take by mouth as needed for diarrhea or loose stools.    Marland Kitchen losartan (COZAAR) 100 MG tablet TAKE 1 TABLET BY MOUTH  EVERY DAY 90 tablet 2  . Magnesium 500 MG TABS Take 500 mg by mouth 3 (three) times a week.    . magnesium gluconate (MAGONATE) 500 MG tablet Take by mouth.    . Menthol, Topical Analgesic, 154 MG PADS Place 1 each onto the skin daily as needed (for hip pain.).    Marland Kitchen pantoprazole (PROTONIX) 40 MG tablet Take 1 tablet (40 mg total) by mouth 2 (two) times daily. 90 tablet 3  . phenazopyridine (PYRIDIUM) 200 MG tablet Take 1 tablet (200 mg total) by mouth 3 (three) times daily as needed for pain (urethral spasm). 10 tablet 1  . PROLIA 60 MG/ML SOSY injection Take 60 mg by mouth every 6 (six) months.   0  . Pumpkin Seed-Soy Germ (AZO BLADDER CONTROL/GO-LESS PO) Take 1 tablet by mouth daily as needed (for bladder pain.).    Marland Kitchen RESTASIS 0.05 % ophthalmic emulsion     . silver sulfADIAZINE (SILVADENE) 1 % cream Apply to affected area 6 times daily as needed    . simvastatin (ZOCOR) 40 MG tablet Take 1 tablet (40 mg total) by mouth daily. 30 tablet 0  . terconazole (TERAZOL 7) 0.4 % vaginal cream     . traMADol (ULTRAM) 50 MG tablet TAKE 1 TABLET BY MOUTH 3 TIMES DAILY AS NEEDED FOR PAIN 60 tablet 2  . triamcinolone (KENALOG) 0.025 % ointment Apply 1 application topically 2 (two) times daily. 30 g 0  . trolamine salicylate (ASPERCREME) 10 % cream Apply 1 application topically 3 (three) times daily as needed for muscle pain.    . vitamin C (ASCORBIC ACID) 500 MG tablet Take 500 mg by mouth daily.     No current facility-administered medications on file prior to visit.    Allergies  Allergen Reactions  . Brimonidine   . Codeine Other (See Comments)  . Levofloxacin Other (See Comments)  . Lidocaine   . Meloxicam   . Oxycodone   . Pregabalin   . Sulfa Antibiotics Other (See Comments)  . Timolol Maleate   . Zolpidem     Objective: Physical Exam  General: Kayla Lloyd is a pleasant 86 y.o. Caucasian female, in NAD. AAO x 3.   Vascular:  Neurovascular status unchanged b/l lower extremities. Capillary fill time to digits <3 seconds b/l lower extremities. Palpable pedal pulses b/l LE. Pedal hair absent. Lower extremity skin temperature gradient within normal limits. No pain with calf compression b/l. Evidence of chronic venous insufficiency b/l LE.  Dermatological:  Pedal skin is thin shiny, atrophic b/l lower extremities. No open wounds bilaterally. No interdigital macerations bilaterally. Toenails 1-5 b/l elongated, discolored, dystrophic, thickened, crumbly with subungual debris and tenderness to dorsal palpation. Cracked nailplate left 4th digit. Incurvated medial/lateral  border. No erythema, no edema, no drainage.  Musculoskeletal:  Normal muscle strength 5/5 to all lower extremity muscle groups bilaterally. No pain crepitus or joint limitation noted with ROM b/l. No gross bony deformities bilaterally.  Neurological:  Protective sensation intact 5/5 intact bilaterally with 10g monofilament b/l. Vibratory sensation intact b/l. Proprioception intact bilaterally.  Assessment and Plan:  No diagnosis found. -Examined patient. -Toenails 1-5 right, L hallux, L 2nd toe, L 3rd toe and L 5th toe debrided in length and girth without iatrogenic bleeding with sterile nail nipper and dremel.  -Offending nail border debrided and curretaged L 4th toe utilizing sterile nail nipper and currette. Border(s) cleansed with alcohol and triple antibiotic ointment applied. Patient instructed to apply Neosporin to L 4th toe  once daily for 7 days.  Return in about 3 months (around 10/25/2019) for nail trim.  Marzetta Board, DPM

## 2019-07-31 DIAGNOSIS — D509 Iron deficiency anemia, unspecified: Secondary | ICD-10-CM | POA: Diagnosis not present

## 2019-07-31 DIAGNOSIS — M7061 Trochanteric bursitis, right hip: Secondary | ICD-10-CM | POA: Diagnosis not present

## 2019-07-31 DIAGNOSIS — M1990 Unspecified osteoarthritis, unspecified site: Secondary | ICD-10-CM | POA: Diagnosis not present

## 2019-07-31 DIAGNOSIS — E538 Deficiency of other specified B group vitamins: Secondary | ICD-10-CM | POA: Diagnosis not present

## 2019-07-31 DIAGNOSIS — M810 Age-related osteoporosis without current pathological fracture: Secondary | ICD-10-CM | POA: Diagnosis not present

## 2019-07-31 DIAGNOSIS — M722 Plantar fascial fibromatosis: Secondary | ICD-10-CM | POA: Diagnosis not present

## 2019-07-31 DIAGNOSIS — G8929 Other chronic pain: Secondary | ICD-10-CM | POA: Diagnosis not present

## 2019-07-31 DIAGNOSIS — I10 Essential (primary) hypertension: Secondary | ICD-10-CM | POA: Diagnosis not present

## 2019-07-31 DIAGNOSIS — M25511 Pain in right shoulder: Secondary | ICD-10-CM | POA: Diagnosis not present

## 2019-08-02 ENCOUNTER — Other Ambulatory Visit: Payer: Self-pay

## 2019-08-02 ENCOUNTER — Ambulatory Visit
Admission: RE | Admit: 2019-08-02 | Discharge: 2019-08-02 | Disposition: A | Discharge status: Home / Self Care | Payer: Medicare HMO | Source: Ambulatory Visit | Attending: Family Medicine | Admitting: Family Medicine

## 2019-08-02 DIAGNOSIS — K7689 Other specified diseases of liver: Secondary | ICD-10-CM | POA: Diagnosis not present

## 2019-08-02 DIAGNOSIS — R7989 Other specified abnormal findings of blood chemistry: Secondary | ICD-10-CM

## 2019-08-07 ENCOUNTER — Other Ambulatory Visit: Payer: Self-pay

## 2019-08-07 ENCOUNTER — Ambulatory Visit (INDEPENDENT_AMBULATORY_CARE_PROVIDER_SITE_OTHER): Payer: Medicare HMO | Admitting: Family Medicine

## 2019-08-07 DIAGNOSIS — R945 Abnormal results of liver function studies: Secondary | ICD-10-CM | POA: Diagnosis not present

## 2019-08-07 DIAGNOSIS — R7989 Other specified abnormal findings of blood chemistry: Secondary | ICD-10-CM

## 2019-08-07 NOTE — Patient Instructions (Signed)
° ° ° °  If you have lab work done today you will be contacted with your lab results within the next 2 weeks.  If you have not heard from us then please contact us. The fastest way to get your results is to register for My Chart. ° ° °IF you received an x-ray today, you will receive an invoice from [facility]. Please contact [facility] at [phone] with questions or concerns regarding your invoice.  ° °IF you received labwork today, you will receive an invoice from [facility]. Please contact [facility] at [phone] with questions or concerns regarding your invoice.  ° °Our billing staff will not be able to assist you with questions regarding bills from these companies. ° °You will be contacted with the lab results as soon as they are available. The fastest way to get your results is to activate your My Chart account. Instructions are located on the last page of this paperwork. If you have not heard from us regarding the results in 2 weeks, please contact this office. °  ° ° ° °

## 2019-08-07 NOTE — Progress Notes (Signed)
Pt was in for lab draw, pt was fasting

## 2019-08-08 LAB — COMPREHENSIVE METABOLIC PANEL
ALT: 15 IU/L (ref 0–32)
AST: 23 IU/L (ref 0–40)
Albumin/Globulin Ratio: 2 (ref 1.2–2.2)
Albumin: 3.9 g/dL (ref 3.5–4.6)
Alkaline Phosphatase: 257 IU/L — ABNORMAL HIGH (ref 48–121)
BUN/Creatinine Ratio: 20 (ref 12–28)
BUN: 13 mg/dL (ref 10–36)
Bilirubin Total: 0.6 mg/dL (ref 0.0–1.2)
CO2: 24 mmol/L (ref 20–29)
Calcium: 9 mg/dL (ref 8.7–10.3)
Chloride: 98 mmol/L (ref 96–106)
Creatinine, Ser: 0.66 mg/dL (ref 0.57–1.00)
GFR calc Af Amer: 88 mL/min/{1.73_m2} (ref >59)
GFR calc non Af Amer: 76 mL/min/{1.73_m2} (ref >59)
Globulin, Total: 2 g/dL (ref 1.5–4.5)
Glucose: 76 mg/dL (ref 65–99)
Potassium: 4.1 mmol/L (ref 3.5–5.2)
Sodium: 134 mmol/L (ref 134–144)
Total Protein: 5.9 g/dL — ABNORMAL LOW (ref 6.0–8.5)

## 2019-08-08 LAB — ACUTE HEP PANEL AND HEP B SURFACE AB
Hep A IgM: NEGATIVE
Hep B C IgM: NEGATIVE
Hep C Virus Ab: <0.1 s/co ratio (ref 0.0–0.9)
Hepatitis B Surf Ab Quant: 319.7 m[IU]/mL (ref >9.9)
Hepatitis B Surface Ag: NEGATIVE

## 2019-08-08 LAB — ANA W/RFX TO ALL IF POSITIVE: Anti Nuclear Antibody (ANA): NEGATIVE

## 2019-08-08 LAB — GAMMA GT: GGT: 298 IU/L — ABNORMAL HIGH (ref 0–60)

## 2019-08-10 ENCOUNTER — Encounter: Payer: Self-pay | Admitting: Podiatry

## 2019-08-20 ENCOUNTER — Encounter: Payer: Self-pay | Admitting: Family Medicine

## 2019-08-23 ENCOUNTER — Encounter: Payer: Self-pay | Admitting: Family Medicine

## 2019-08-23 ENCOUNTER — Other Ambulatory Visit: Payer: Self-pay

## 2019-08-23 ENCOUNTER — Ambulatory Visit (INDEPENDENT_AMBULATORY_CARE_PROVIDER_SITE_OTHER): Payer: Medicare HMO | Admitting: Podiatry

## 2019-08-23 DIAGNOSIS — I872 Venous insufficiency (chronic) (peripheral): Secondary | ICD-10-CM | POA: Diagnosis not present

## 2019-08-23 DIAGNOSIS — I8393 Asymptomatic varicose veins of bilateral lower extremities: Secondary | ICD-10-CM | POA: Diagnosis not present

## 2019-08-23 DIAGNOSIS — G629 Polyneuropathy, unspecified: Secondary | ICD-10-CM | POA: Diagnosis not present

## 2019-08-23 DIAGNOSIS — M722 Plantar fascial fibromatosis: Secondary | ICD-10-CM

## 2019-08-23 DIAGNOSIS — R600 Localized edema: Secondary | ICD-10-CM | POA: Diagnosis not present

## 2019-08-23 NOTE — Patient Instructions (Signed)

## 2019-08-24 ENCOUNTER — Ambulatory Visit (INDEPENDENT_AMBULATORY_CARE_PROVIDER_SITE_OTHER): Payer: Medicare HMO

## 2019-08-24 ENCOUNTER — Encounter: Payer: Self-pay | Admitting: Family Medicine

## 2019-08-24 ENCOUNTER — Ambulatory Visit (INDEPENDENT_AMBULATORY_CARE_PROVIDER_SITE_OTHER): Payer: Medicare HMO | Admitting: Family Medicine

## 2019-08-24 VITALS — BP 145/77 | HR 65 | Temp 97.6°F | Ht 59.0 in | Wt 115.0 lb

## 2019-08-24 DIAGNOSIS — S8010XA Contusion of unspecified lower leg, initial encounter: Secondary | ICD-10-CM

## 2019-08-24 DIAGNOSIS — S8012XA Contusion of left lower leg, initial encounter: Secondary | ICD-10-CM

## 2019-08-24 DIAGNOSIS — N390 Urinary tract infection, site not specified: Secondary | ICD-10-CM | POA: Diagnosis not present

## 2019-08-24 DIAGNOSIS — S8011XA Contusion of right lower leg, initial encounter: Secondary | ICD-10-CM | POA: Diagnosis not present

## 2019-08-24 DIAGNOSIS — R1030 Lower abdominal pain, unspecified: Secondary | ICD-10-CM | POA: Diagnosis not present

## 2019-08-24 DIAGNOSIS — R319 Hematuria, unspecified: Secondary | ICD-10-CM

## 2019-08-24 DIAGNOSIS — N952 Postmenopausal atrophic vaginitis: Secondary | ICD-10-CM

## 2019-08-24 DIAGNOSIS — Z96651 Presence of right artificial knee joint: Secondary | ICD-10-CM | POA: Diagnosis not present

## 2019-08-24 DIAGNOSIS — R300 Dysuria: Secondary | ICD-10-CM | POA: Diagnosis not present

## 2019-08-24 DIAGNOSIS — R600 Localized edema: Secondary | ICD-10-CM | POA: Diagnosis not present

## 2019-08-24 LAB — POC MICROSCOPIC URINALYSIS (UMFC)

## 2019-08-24 LAB — POCT URINALYSIS DIP (MANUAL ENTRY)
Bilirubin, UA: NEGATIVE
Glucose, UA: 100 mg/dL — AB
Ketones, POC UA: NEGATIVE mg/dL
Nitrite, UA: POSITIVE — AB
Protein Ur, POC: 30 mg/dL — AB
Spec Grav, UA: <=1.005 — AB (ref 1.010–1.025)
Urobilinogen, UA: 1 E.U./dL
pH, UA: 6 (ref 5.0–8.0)

## 2019-08-24 MED ORDER — CEPHALEXIN 250 MG/5ML PO SUSR: 500.0000 mg | Freq: Two times a day (BID) | ORAL | 0 refills | Status: DC

## 2019-08-24 NOTE — Telephone Encounter (Signed)
Patient states she went to see [facility] and Dr. Guy Sandifer recommended getting a prescription for a water pill for swelling in her ankles. Please Advise.

## 2019-08-24 NOTE — Patient Instructions (Addendum)
No sign of broken bones on your leg x-rays.  Bruising is likely due to contusion.  See information below.  Okay to apply cool compresses, elevate legs as able, follow-up if not improving into next week.  Increase amount of estrogen cream for atrophic vaginitis or irritation of the vaginal area.  If that does not help or you notice any new white discharge, can try over-the-counter Monistat.  I sent in a new prescription for the 250 mg per 5 mL suspension of Keflex, take 10 mL twice per day for the next 1 week.  If symptoms or not improving next week or any worsening symptoms return for recheck here or with your OB/GYN.   Contusion A contusion is a deep bruise. Contusions are the result of a blunt injury to tissues and muscle fibers under the skin. The injury causes bleeding under the skin. The skin overlying the contusion may turn blue, purple, or yellow. Minor injuries will give you a painless contusion, but more severe injuries cause contusions that may stay painful and swollen for a few weeks. Follow these instructions at home: Pay attention to any changes in your symptoms. Let your health care provider know about them. Take these actions to relieve your pain. Managing pain, stiffness, and swelling   Use resting, icing, applying pressure (compression), and raising (elevating) the injured area. This is often called the RICE strategy. ? Rest the injured area. Return to your normal activities as told by your health care provider. Ask your health care provider what activities are safe for you. ? If directed, put ice on the injured area:  Put ice in a plastic bag.  Place a towel between your skin and the bag.  Leave the ice on for 20 minutes, 2-3 times per day. ? If directed, apply light compression to the injured area using an elastic bandage. Make sure the bandage is not wrapped too tightly. Remove and reapply the bandage as directed by your health care provider. ? If possible, raise  (elevate) the injured area above the level of your heart while you are sitting or lying down. General instructions  Take over-the-counter and prescription medicines only as told by your health care provider.  Keep all follow-up visits as told by your health care provider. This is important. Contact a health care provider if:  Your symptoms do not improve after several days of treatment.  Your symptoms get worse.  You have difficulty moving the injured area. Get help right away if:  You have severe pain.  You have numbness in a hand or foot.  Your hand or foot turns pale or cold. Summary  A contusion is a deep bruise.  Contusions are the result of a blunt injury to tissues and muscle fibers under the skin.  It is treated with rest, ice, compression, and elevation. You may be given over-the-counter medicines for pain.  Contact a health care provider if your symptoms do not improve, or get worse.  Get help right away if you have severe pain, have numbness, or the area turns pale or cold. This information is not intended to replace advice given to you by your health care provider. Make sure you discuss any questions you have with your health care provider. Document Revised: 09/01/2017 Document Reviewed: 09/01/2017 Elsevier Patient Education  [employer].  Urinary Tract Infection, Adult  A urinary tract infection (UTI) is an infection of any part of the urinary tract. The urinary tract includes the kidneys, ureters, bladder, and urethra.  These organs make, store, and get rid of urine in the body. Your health care provider may use other names to describe the infection. An upper UTI affects the ureters and kidneys (pyelonephritis). A lower UTI affects the bladder (cystitis) and urethra (urethritis). What are the causes? Most urinary tract infections are caused by bacteria in your genital area, around the entrance to your urinary tract (urethra). These bacteria grow and cause  inflammation of your urinary tract. What increases the risk? You are more likely to develop this condition if:  You have a urinary catheter that stays in place (indwelling).  You are not able to control when you urinate or have a bowel movement (you have incontinence).  You are female and you: ? Use a spermicide or diaphragm for birth control. ? Have low estrogen levels. ? Are pregnant.  You have certain genes that increase your risk (genetics).  You are sexually active.  You take antibiotic medicines.  You have a condition that causes your flow of urine to slow down, such as: ? An enlarged prostate, if you are female. ? Blockage in your urethra (stricture). ? A kidney stone. ? A nerve condition that affects your bladder control (neurogenic bladder). ? Not getting enough to drink, or not urinating often.  You have certain medical conditions, such as: ? Diabetes. ? A weak disease-fighting system (immunesystem). ? Sickle cell disease. ? Gout. ? Spinal cord injury. What are the signs or symptoms? Symptoms of this condition include:  Needing to urinate right away (urgently).  Frequent urination or passing small amounts of urine frequently.  Pain or burning with urination.  Blood in the urine.  Urine that smells bad or unusual.  Trouble urinating.  Cloudy urine.  Vaginal discharge, if you are female.  Pain in the abdomen or the lower back. You may also have:  Vomiting or a decreased appetite.  Confusion.  Irritability or tiredness.  A fever.  Diarrhea. The first symptom in older adults may be confusion. In some cases, they may not have any symptoms until the infection has worsened. How is this diagnosed? This condition is diagnosed based on your medical history and a physical exam. You may also have other tests, including:  Urine tests.  Blood tests.  Tests for sexually transmitted infections (STIs). If you have had more than one UTI, a cystoscopy or  imaging studies may be done to determine the cause of the infections. How is this treated? Treatment for this condition includes:  Antibiotic medicine.  Over-the-counter medicines to treat discomfort.  Drinking enough water to stay hydrated. If you have frequent infections or have other conditions such as a kidney stone, you may need to see a health care provider who specializes in the urinary tract (urologist). In rare cases, urinary tract infections can cause sepsis. Sepsis is a life-threatening condition that occurs when the body responds to an infection. Sepsis is treated in the hospital with IV antibiotics, fluids, and other medicines. Follow these instructions at home:  Medicines  Take over-the-counter and prescription medicines only as told by your health care provider.  If you were prescribed an antibiotic medicine, take it as told by your health care provider. Do not stop using the antibiotic even if you start to feel better. General instructions  Make sure you: ? Empty your bladder often and completely. Do not hold urine for long periods of time. ? Empty your bladder after sex. ? Wipe from front to back after a bowel movement if you are  female. Use each tissue one time when you wipe.  Drink enough fluid to keep your urine pale yellow.  Keep all follow-up visits as told by your health care provider. This is important. Contact a health care provider if:  Your symptoms do not get better after 1-2 days.  Your symptoms go away and then return. Get help right away if you have:  Severe pain in your back or your lower abdomen.  A fever.  Nausea or vomiting. Summary  A urinary tract infection (UTI) is an infection of any part of the urinary tract, which includes the kidneys, ureters, bladder, and urethra.  Most urinary tract infections are caused by bacteria in your genital area, around the entrance to your urinary tract (urethra).  Treatment for this condition often  includes antibiotic medicines.  If you were prescribed an antibiotic medicine, take it as told by your health care provider. Do not stop using the antibiotic even if you start to feel better.  Keep all follow-up visits as told by your health care provider. This is important. This information is not intended to replace advice given to you by your health care provider. Make sure you discuss any questions you have with your health care provider. Document Revised: 12/29/2017 Document Reviewed: 07/21/2017 Elsevier Patient Education  2020 [place].  Atrophic Vaginitis Atrophic vaginitis is a condition in which the tissues that line the vagina become dry and thin. This condition occurs in women who have stopped having their period. It is caused by a drop in a female hormone (estrogen). This hormone helps:  To keep the vagina moist.  To make a clear fluid. This clear fluid helps: ? To make the vagina ready for sex. ? To protect the vagina from infection. If the lining of the vagina is dry and thin, it may cause irritation, burning, or itchiness. It may also:  Make sex painful.  Make an exam of your vagina painful.  Cause bleeding.  Make you lose interest in sex.  Cause a burning feeling when you pee (urinate).  Cause a brown or yellow fluid to come from your vagina. Some women do not have symptoms. Follow these instructions at home: Medicines  Take over-the-counter and prescription medicines only as told by your doctor.  Do not use herbs or other medicines unless your doctor says it is okay.  Use medicines for for dryness. These include: ? Oils to make the vagina soft. ? Creams. ? Moisturizers. General instructions  Do not douche.  Do not use products that can make your vagina dry. These include: ? Scented sprays. ? Scented tampons. ? Scented soaps.  Sex can help increase blood flow and soften the tissue in the vagina. If it hurts to have sex: ? Tell your  partner. ? Use products to make sex more comfortable. Use these only as told by your doctor. Contact a doctor if you:  Have discharge from the vagina that is different than usual.  Have a bad smell coming from your vagina.  Have new symptoms.  Do not get better.  Get worse. Summary  Atrophic vaginitis is a condition in which the lining of the vagina becomes dry and thin.  This condition affects women who have stopped having their periods.  Treatment may include using products that help make the vagina soft.  Call a doctor if do not get better with treatment. This information is not intended to replace advice given to you by your health care provider. Make sure you discuss  any questions you have with your health care provider. Document Revised: 01/24/2017 Document Reviewed: 01/24/2017 Elsevier Patient Education  [employer].     If you have lab work done today you will be contacted with your lab results within the next 2 weeks.  If you have not heard from [place] then please contact us. The fastest way to get your results is to register for My Chart.   IF you received an x-ray today, you will receive an invoice from [facility]. Please contact [facility] at [phone] with questions or concerns regarding your invoice.   IF you received labwork today, you will receive an invoice from [facility]. Please contact [facility] at [phone] with questions or concerns regarding your invoice.   Our billing staff will not be able to assist you with questions regarding bills from these companies.  You will be contacted with the lab results as soon as they are available. The fastest way to get your results is to activate your My Chart account. Instructions are located on the last page of this paperwork. If you have not heard from [place] regarding the results in 2 weeks, please contact this office.

## 2019-08-24 NOTE — Progress Notes (Signed)
Subjective:  Patient ID: Kayla Lloyd, female    DOB: May 14, 1923  Age: 86 y.o. MRN: [id number]  CC:  Chief Complaint  Patient presents with  . Pelvic Pain    Pt has been experiencing some burning in the vaginal are for quit some time. Pt leg leg is bruised. Granddaughter stated that her walker feel on her Lt leg outside in the parking lot  . left leg pain    HPI Kayla Lloyd present for   Vaginal burning:  Primary care provider Dr. Pamella Pert, office visit June 1 to establish care.  Previously under the care of Dr. Nolon Rod.  Noted she was on Premarin, under the care of OB/GYN - [place] at [facility].  Treated with daily Keflex for possible chronic urinary tract infection.  Gabapentin and Myrbetriq caused rashes, Azo as needed. Urinalysis was normal in June, plan to continue vaginal estrogen, as needed Azo, daily suppressive antibiotic therapy.  Terazol. Here with grandaughter.  Continuing to use vaginal estrogen. Outside ecoli infection in vaginal area - treated  More burning with urination at night. Noted June 17 visit at [facility], vaginal atrophy, chronic vaginitis, vaginal irritation, resolution of previous vulvar irritation.  Previous overgrowth of gram-negative bacteria, culture obtained.  rx clindamycin cream - only used 5 days d/t worse burning.  Burning in area with and without urinating. No hematuria, no fever.  premarin cream every morning, vaseline during day multiple times (recommended by gyn), and perigard on outside kelfex 25ml QD (125mg /16ml susp).  Has also ordered pumpkin seed extract at recommendation from PCP.     Left leg pain: Leaving house  This afternoon. Has had light bruising in left leg for year, but new bruising after walker fell on to her left leg.  No fall. Burning on skin of leg, some in middle of leg.  Walker fell on both legs this morning, left greater than R bruising.  Followed by podiatry and referred to  vascular - has not seen them yet - appt pending. Seen by podiatry - chronic venous insufficiency noted - they referred her. Foot swelling has improved some.   History Patient Active Problem List   Diagnosis Date Noted  . Vaginitis, atrophic 05/02/2019  . Need for prophylactic vaccination against Streptococcus pneumoniae (pneumococcus) 04/24/2019  . Vaginitis and vulvovaginitis 04/03/2019  . Chronic UTI 04/03/2019  . Dysphagia 10/17/2017  . Abnormal barium swallow 10/17/2017  . Gastroesophageal reflux disease 10/17/2017  . OAB (overactive bladder) 09/21/2017  . Pharyngeal dysphagia 07/11/2017  . Urinary incontinence in female 11/30/2016  . Pelvic relaxation 11/30/2016  . Dysuria 11/30/2016  . Vaginal atrophy 11/30/2016  . Essential hypertension 02/06/2014  . Thyroid activity decreased 02/06/2014  . Edema 05/03/2013  . Anemia 10/29/2011  . Arthritis 10/29/2011  . B12 deficiency 10/29/2011  . Gastro-esophageal reflux disease with esophagitis 10/29/2011  . Hyperlipidemia 10/29/2011  . Seasonal allergies 10/29/2011   Past Medical History:  Diagnosis Date  . Bowel obstruction ([place]) 1950  . Cancer of septum of nose (HCC)    melanoma  . Hypertension   . Hypothyroidism    Past Surgical History:  Procedure Laterality Date  . BIOPSY  12/07/2017   Procedure: BIOPSY;  Surgeon: Rush Landmark Telford Nab., MD;  Location: [facility];  Service: Gastroenterology;;  . CHOLECYSTECTOMY    . ESOPHAGOGASTRODUODENOSCOPY (EGD) WITH PROPOFOL N/A 12/07/2017   Procedure: ESOPHAGOGASTRODUODENOSCOPY (EGD) WITH PROPOFOL;  Surgeon: Rush Landmark Telford Nab., MD;  Location: [facility];  Service: Gastroenterology;  Laterality: N/A;  May need Dilation  . hysterecrtomy    . left hip surgery    . ROTATOR CUFF REPAIR Right   . SMALL INTESTINE SURGERY     Allergies  Allergen Reactions  . Brimonidine   . Codeine Other (See Comments)  . Levofloxacin Other (See Comments)  . Lidocaine   . Meloxicam   .  Oxycodone   . Pregabalin   . Sulfa Antibiotics Other (See Comments)  . Timolol Maleate   . Zolpidem    Prior to Admission medications   Medication Sig Start Date End Date Taking? Authorizing Provider  ALPRAZolam (XANAX) 0.25 MG tablet Take 1 tablet (0.25 mg total) by mouth 3 (three) times daily. 06/26/19  Yes Rutherford Guys, MD  BC FAST PAIN RELIEF [phone] MG PACK Take 1 packet by mouth daily as needed (for pain.).   Yes [provider]  betamethasone valerate ointment (VALISONE) 0.1 % 1 application. 02/28/19  Yes [provider]  bisacodyl (DULCOLAX) 5 MG EC tablet Take 5 mg by mouth daily as needed (for constipation.).  07/13/16  Yes [provider]  castor oil liquid Take 15 mLs by mouth daily as needed for moderate constipation.   Yes [provider]  cephALEXin (KEFLEX) 125 MG/5ML suspension  06/13/19  Yes [provider]  CEQUA 0.09 % SOLN Apply 1 drop to eye 2 (two) times daily. 10/10/18  Yes [provider]  cetirizine (ZYRTEC) 10 MG tablet Take 1 tablet (10 mg total) by mouth daily. 01/04/19  Yes Forrest Moron, MD  Cholecalciferol (VITAMIN D3) 50 MCG (2000 UT) TABS Take 2,000 Units by mouth daily.   Yes [provider]  clindamycin (CLEOCIN) 2 % vaginal cream  07/17/19  Yes [provider]  docusate sodium (DULCOLAX) 100 MG capsule Take 100 mg by mouth 2 (two) times daily as needed (for constipation).  07/13/16  Yes [provider]  esomeprazole (NEXIUM) 40 MG capsule Take 1 capsule (40 mg total) by mouth 2 (two) times daily. 01/03/18  Yes Mansouraty, Telford Nab., MD  ferrous sulfate 325 (65 FE) MG EC tablet Take by mouth.   Yes [provider]  FLUAD QUADRIVALENT 0.5 ML injection  12/22/18  Yes [provider]  Ginkgo Biloba 120 MG CAPS Take 120 mg by mouth daily.   Yes [provider]  hydrocortisone cream 1 % Apply 1 application topically 2 (two) times daily.   Yes [provider]  Lactobacillus (PROBIOTIC ACIDOPHILUS PO) Take 1 capsule by mouth daily.   Yes [provider]  levothyroxine (SYNTHROID) 75 MCG tablet TAKE 1 TABLET BY MOUTH EVERY DAY 04/02/19  Yes Stallings, Zoe A, MD  Liniments (SALONPAS PAIN RELIEF PATCH EX) Place 1 patch onto the skin daily as needed (for shoulder pain.).   Yes [provider]  loperamide (IMODIUM) 2 MG capsule Take by mouth as needed for diarrhea or loose stools.   Yes [provider]  losartan (COZAAR) 100 MG tablet TAKE 1 TABLET BY MOUTH EVERY DAY 04/03/19  Yes Stallings, Zoe A, MD  Magnesium 500 MG TABS Take 500 mg by mouth 3 (three) times a week.   Yes [provider]  magnesium gluconate (MAGONATE) 500 MG tablet Take by mouth.   Yes [provider]  Menthol, Topical Analgesic, 154 MG PADS Place 1 each onto the skin daily as needed (for hip pain.).   Yes [provider]  pantoprazole (PROTONIX) 40 MG tablet Take 1 tablet (40 mg total) by  mouth 2 (two) times daily. 05/14/19  Yes Forrest Moron, MD  phenazopyridine (PYRIDIUM) 200 MG tablet Take 1 tablet (200 mg total) by mouth 3 (three) times daily as needed for pain (urethral spasm). 03/02/18  Yes Constant, Peggy, MD  PROLIA 60 MG/ML SOSY injection Take 60 mg by mouth every 6 (six) months. 09/15/17  Yes [provider]  Pumpkin Seed-Soy Germ (AZO BLADDER CONTROL/GO-LESS PO) Take 1 tablet by mouth daily as needed (for bladder pain.).   Yes [provider]  RESTASIS 0.05 % ophthalmic emulsion  10/18/18  Yes [provider]  silver sulfADIAZINE (SILVADENE) 1 % cream Apply to affected area 6 times daily as needed 03/16/19  Yes [provider]  simvastatin (ZOCOR) 40 MG tablet Take 1 tablet (40 mg total) by mouth daily. 01/04/19  Yes Delia Chimes A, MD  terconazole (TERAZOL 7) 0.4 % vaginal cream  04/19/19  Yes [provider]  traMADol (ULTRAM) 50 MG tablet TAKE 1 TABLET BY MOUTH 3 TIMES  DAILY AS NEEDED FOR PAIN 06/26/19  Yes Rutherford Guys, MD  triamcinolone (KENALOG) 0.025 % ointment Apply 1 application topically 2 (two) times daily. 02/05/19  Yes Forrest Moron, MD  trolamine salicylate (ASPERCREME) 10 % cream Apply 1 application topically 3 (three) times daily as needed for muscle pain.   Yes [provider]  vitamin C (ASCORBIC ACID) 500 MG tablet Take 500 mg by mouth daily.   Yes [provider]   Social History   Socioeconomic History  . Marital status: Widowed    Spouse name: Not on file  . Number of children: 8  . Years of education: Not on file  . Highest education level: Not on file  Occupational History  . Not on file  Tobacco Use  . Smoking status: Never Smoker  . Smokeless tobacco: Never Used  Vaping Use  . Vaping Use: Never used  Substance and Sexual Activity  . Alcohol use: No    Comment: wine/rare  . Drug use: No  . Sexual activity: Not Currently  Other Topics Concern  . Not on file  Social History Narrative  . Not on file   Social Determinants of Health   Financial Resource Strain:   . Difficulty of Paying Living Expenses:   Food Insecurity:   . Worried About Charity fundraiser in the Last Year:   . Arboriculturist in the Last Year:   Transportation Needs:   . Film/video editor (Medical):   Marland Kitchen Lack of Transportation (Non-Medical):   Physical Activity:   . Days of Exercise per Week:   . Minutes of Exercise per Session:   Stress:   . Feeling of Stress :   Social Connections:   . Frequency of Communication with Friends and Family:   . Frequency of Social Gatherings with Friends and Family:   . Attends Religious Services:   . Active Member of Clubs or Organizations:   . Attends Archivist Meetings:   Marland Kitchen Marital Status:   Intimate Partner Violence:   . Fear of Current or Ex-Partner:   . Emotionally Abused:   Marland Kitchen Physically Abused:   . Sexually Abused:     Review of Systems Per hpi.   Objective:    Vitals:   08/24/19 1626  BP: (!) 145/77  Pulse: 65  Temp: 97.6 F (36.4 C)  TempSrc: Temporal  SpO2: 95%  Weight: 115 lb (52.2 kg)  Height: 4\' 11"  (1.499 m)  Physical Exam Vitals reviewed. Exam conducted with a chaperone present.  Constitutional:      Appearance: She is well-developed.  HENT:     Head: Normocephalic and atraumatic.  Eyes:     Conjunctiva/sclera: Conjunctivae normal.     Pupils: Pupils are equal, round, and reactive to light.  Neck:     Vascular: No carotid bruit.  Cardiovascular:     Rate and Rhythm: Normal rate and regular rhythm.     Heart sounds: Normal heart sounds.  Pulmonary:     Effort: Pulmonary effort is normal.     Breath sounds: Normal breath sounds.  Abdominal:     Palpations: Abdomen is soft. There is no pulsatile mass.     Tenderness: There is no abdominal tenderness.  Genitourinary:    Exam position: Lithotomy position.     Labia:        Right: No rash (No rash, no discharge, nontender, atrophy noted. but no external lesions) or tenderness.        Left: No rash or tenderness.   Musculoskeletal:     Comments: Ecchymosis with soft tissue swelling on the anterior most aspect of her left lower leg, some diffuse tenderness to palpation.  Burning sensation over the dorsal skin.  Skin intact, no active bleeding or discharge.  See photo.   Small area of ecchymosis on the right anterior lower leg, no focal bony tenderness.  See photos    Skin:    General: Skin is warm and dry.  Neurological:     Mental Status: She is alert and oriented to person, place, and time.  Psychiatric:        Mood and Affect: Mood normal.        Behavior: Behavior normal.      Results for orders placed or performed in visit on 08/24/19  POCT urinalysis dipstick  Result Value Ref Range   Color, UA orange (A) yellow   Clarity, UA cloudy (A) clear   Glucose, UA =100 (A) negative mg/dL   Bilirubin, UA negative negative   Ketones, POC UA negative negative  mg/dL   Spec Grav, UA <=1.005 (A) 1.010 - 1.025   Blood, UA small (A) negative   pH, UA 6.0 5.0 - 8.0   Protein Ur, POC =30 (A) negative mg/dL   Urobilinogen, UA 1.0 0.2 or 1.0 E.U./dL   Nitrite, UA Positive (A) Negative   Leukocytes, UA Large (3+) (A) Negative  POCT Microscopic Urinalysis ([facility])  Result Value Ref Range   WBC,UR,HPF,POC Too numerous to count  (A) None WBC/hpf   RBC,UR,HPF,POC Moderate (A) None RBC/hpf   Bacteria Moderate (A) None, Too numerous to count   Mucus Present (A) Absent   Epithelial Cells, UR Per Microscopy Few (A) None, Too numerous to count cells/hpf  DG Tibia/Fibula Left  Result Date: 08/24/2019 CLINICAL DATA:  Leg swelling and bruising EXAM: LEFT TIBIA AND FIBULA - 2 VIEW COMPARISON:  None. FINDINGS: No fracture or malalignment. Prominent edema within the soft tissues. IMPRESSION: No acute osseous abnormality. Electronically Signed   By: Donavan Foil M.D.   On: 08/24/2019 18:01   DG Tibia/Fibula Right  Result Date: 08/24/2019 CLINICAL DATA:  Swelling and bruising injury EXAM: RIGHT TIBIA AND FIBULA - 2 VIEW COMPARISON:  None. FINDINGS: Status post right knee replacement. No acute displaced fracture or malalignment. Edema within the soft tissues. IMPRESSION: No acute osseous abnormality. Electronically Signed   By: Donavan Foil M.D.   On: 08/24/2019 18:02  Assessment & Plan:  Kayla Lloyd is a 86 y.o. female . Dysuria - Plan: POCT urinalysis dipstick, POCT Microscopic Urinalysis ([facility]), Urine Culture Urinary tract infection with hematuria, site unspecified - Plan: cephALEXin (KEFLEX) 250 MG/5ML suspension Vaginitis, atrophic  -Suspect component of atrophic vaginitis with minimal use of estrogen cream.  Secondary UTI noted on urinalysis today.   -Increase topical estrogen cream, can do additional dose tonight and slightly larger amount can be applied.  If discharge, can try over-the-counter Monistat but less likely candidal  vaginitis.  -Check urine culture, increase Keflex to 500 mg twice daily with close monitoring of culture and sensitivities.  Antibiotic allergies reviewed, decided to remain on Keflex.  ER precautions given over the weekend  -RTC precautions if not improving into next week.  Contusion of multiple sites of lower extremity, unspecified laterality, initial encounter - Plan: DG Tibia/Fibula Left, DG Tibia/Fibula Right  -Reassuring x-ray, suspected contusion.  Symptomatic care discussed with RTC precautions, handout given      Meds ordered this encounter  Medications  . cephALEXin (KEFLEX) 250 MG/5ML suspension    Sig: Take 10 mLs (500 mg total) by mouth 2 (two) times daily. For 1 week.    Dispense:  200 mL    Refill:  0   Patient Instructions    No sign of broken bones on your leg x-rays.  Bruising is likely due to contusion.  See information below.  Okay to apply cool compresses, elevate legs as able, follow-up if not improving into next week.  Increase amount of estrogen cream for atrophic vaginitis or irritation of the vaginal area.  If that does not help or you notice any new white discharge, can try over-the-counter Monistat.  I sent in a new prescription for the 250 mg per 5 mL suspension of Keflex, take 10 mL twice per day for the next 1 week.  If symptoms or not improving next week or any worsening symptoms return for recheck here or with your OB/GYN.   Contusion A contusion is a deep bruise. Contusions are the result of a blunt injury to tissues and muscle fibers under the skin. The injury causes bleeding under the skin. The skin overlying the contusion may turn blue, purple, or yellow. Minor injuries will give you a painless contusion, but more severe injuries cause contusions that may stay painful and swollen for a few weeks. Follow these instructions at home: Pay attention to any changes in your symptoms. Let your health care provider know about them. Take these actions to  relieve your pain. Managing pain, stiffness, and swelling   Use resting, icing, applying pressure (compression), and raising (elevating) the injured area. This is often called the RICE strategy. ? Rest the injured area. Return to your normal activities as told by your health care provider. Ask your health care provider what activities are safe for you. ? If directed, put ice on the injured area:  Put ice in a plastic bag.  Place a towel between your skin and the bag.  Leave the ice on for 20 minutes, 2-3 times per day. ? If directed, apply light compression to the injured area using an elastic bandage. Make sure the bandage is not wrapped too tightly. Remove and reapply the bandage as directed by your health care provider. ? If possible, raise (elevate) the injured area above the level of your heart while you are sitting or lying down. General instructions  Take over-the-counter and prescription medicines only as told by your health  care provider.  Keep all follow-up visits as told by your health care provider. This is important. Contact a health care provider if:  Your symptoms do not improve after several days of treatment.  Your symptoms get worse.  You have difficulty moving the injured area. Get help right away if:  You have severe pain.  You have numbness in a hand or foot.  Your hand or foot turns pale or cold. Summary  A contusion is a deep bruise.  Contusions are the result of a blunt injury to tissues and muscle fibers under the skin.  It is treated with rest, ice, compression, and elevation. You may be given over-the-counter medicines for pain.  Contact a health care provider if your symptoms do not improve, or get worse.  Get help right away if you have severe pain, have numbness, or the area turns pale or cold. This information is not intended to replace advice given to you by your health care provider. Make sure you discuss any questions you have with your  health care provider. Document Revised: 09/01/2017 Document Reviewed: 09/01/2017 Elsevier Patient Education  [place].  Urinary Tract Infection, Adult  A urinary tract infection (UTI) is an infection of any part of the urinary tract. The urinary tract includes the kidneys, ureters, bladder, and urethra. These organs make, store, and get rid of urine in the body. Your health care provider may use other names to describe the infection. An upper UTI affects the ureters and kidneys (pyelonephritis). A lower UTI affects the bladder (cystitis) and urethra (urethritis). What are the causes? Most urinary tract infections are caused by bacteria in your genital area, around the entrance to your urinary tract (urethra). These bacteria grow and cause inflammation of your urinary tract. What increases the risk? You are more likely to develop this condition if:  You have a urinary catheter that stays in place (indwelling).  You are not able to control when you urinate or have a bowel movement (you have incontinence).  You are female and you: ? Use a spermicide or diaphragm for birth control. ? Have low estrogen levels. ? Are pregnant.  You have certain genes that increase your risk (genetics).  You are sexually active.  You take antibiotic medicines.  You have a condition that causes your flow of urine to slow down, such as: ? An enlarged prostate, if you are female. ? Blockage in your urethra (stricture). ? A kidney stone. ? A nerve condition that affects your bladder control (neurogenic bladder). ? Not getting enough to drink, or not urinating often.  You have certain medical conditions, such as: ? Diabetes. ? A weak disease-fighting system (immunesystem). ? Sickle cell disease. ? Gout. ? Spinal cord injury. What are the signs or symptoms? Symptoms of this condition include:  Needing to urinate right away (urgently).  Frequent urination or passing small amounts of urine  frequently.  Pain or burning with urination.  Blood in the urine.  Urine that smells bad or unusual.  Trouble urinating.  Cloudy urine.  Vaginal discharge, if you are female.  Pain in the abdomen or the lower back. You may also have:  Vomiting or a decreased appetite.  Confusion.  Irritability or tiredness.  A fever.  Diarrhea. The first symptom in older adults may be confusion. In some cases, they may not have any symptoms until the infection has worsened. How is this diagnosed? This condition is diagnosed based on your medical history and a physical exam. You  may also have other tests, including:  Urine tests.  Blood tests.  Tests for sexually transmitted infections (STIs). If you have had more than one UTI, a cystoscopy or imaging studies may be done to determine the cause of the infections. How is this treated? Treatment for this condition includes:  Antibiotic medicine.  Over-the-counter medicines to treat discomfort.  Drinking enough water to stay hydrated. If you have frequent infections or have other conditions such as a kidney stone, you may need to see a health care provider who specializes in the urinary tract (urologist). In rare cases, urinary tract infections can cause sepsis. Sepsis is a life-threatening condition that occurs when the body responds to an infection. Sepsis is treated in the hospital with IV antibiotics, fluids, and other medicines. Follow these instructions at home:  Medicines  Take over-the-counter and prescription medicines only as told by your health care provider.  If you were prescribed an antibiotic medicine, take it as told by your health care provider. Do not stop using the antibiotic even if you start to feel better. General instructions  Make sure you: ? Empty your bladder often and completely. Do not hold urine for long periods of time. ? Empty your bladder after sex. ? Wipe from front to back after a bowel movement  if you are female. Use each tissue one time when you wipe.  Drink enough fluid to keep your urine pale yellow.  Keep all follow-up visits as told by your health care provider. This is important. Contact a health care provider if:  Your symptoms do not get better after 1-2 days.  Your symptoms go away and then return. Get help right away if you have:  Severe pain in your back or your lower abdomen.  A fever.  Nausea or vomiting. Summary  A urinary tract infection (UTI) is an infection of any part of the urinary tract, which includes the kidneys, ureters, bladder, and urethra.  Most urinary tract infections are caused by bacteria in your genital area, around the entrance to your urinary tract (urethra).  Treatment for this condition often includes antibiotic medicines.  If you were prescribed an antibiotic medicine, take it as told by your health care provider. Do not stop using the antibiotic even if you start to feel better.  Keep all follow-up visits as told by your health care provider. This is important. This information is not intended to replace advice given to you by your health care provider. Make sure you discuss any questions you have with your health care provider. Document Revised: 12/29/2017 Document Reviewed: 07/21/2017 Elsevier Patient Education  2020 [place].  Atrophic Vaginitis Atrophic vaginitis is a condition in which the tissues that line the vagina become dry and thin. This condition occurs in women who have stopped having their period. It is caused by a drop in a female hormone (estrogen). This hormone helps:  To keep the vagina moist.  To make a clear fluid. This clear fluid helps: ? To make the vagina ready for sex. ? To protect the vagina from infection. If the lining of the vagina is dry and thin, it may cause irritation, burning, or itchiness. It may also:  Make sex painful.  Make an exam of your vagina painful.  Cause bleeding.  Make  you lose interest in sex.  Cause a burning feeling when you pee (urinate).  Cause a brown or yellow fluid to come from your vagina. Some women do not have symptoms. Follow these instructions at  home: Medicines  Take over-the-counter and prescription medicines only as told by your doctor.  Do not use herbs or other medicines unless your doctor says it is okay.  Use medicines for for dryness. These include: ? Oils to make the vagina soft. ? Creams. ? Moisturizers. General instructions  Do not douche.  Do not use products that can make your vagina dry. These include: ? Scented sprays. ? Scented tampons. ? Scented soaps.  Sex can help increase blood flow and soften the tissue in the vagina. If it hurts to have sex: ? Tell your partner. ? Use products to make sex more comfortable. Use these only as told by your doctor. Contact a doctor if you:  Have discharge from the vagina that is different than usual.  Have a bad smell coming from your vagina.  Have new symptoms.  Do not get better.  Get worse. Summary  Atrophic vaginitis is a condition in which the lining of the vagina becomes dry and thin.  This condition affects women who have stopped having their periods.  Treatment may include using products that help make the vagina soft.  Call a doctor if do not get better with treatment. This information is not intended to replace advice given to you by your health care provider. Make sure you discuss any questions you have with your health care provider. Document Revised: 01/24/2017 Document Reviewed: 01/24/2017 Elsevier Patient Education  [employer].     If you have lab work done today you will be contacted with your lab results within the next 2 weeks.  If you have not heard from [place] then please contact us. The fastest way to get your results is to register for My Chart.   IF you received an x-ray today, you will receive an invoice from [facility].  Please contact [facility] at [phone] with questions or concerns regarding your invoice.   IF you received labwork today, you will receive an invoice from [place]. Please contact [facility] at [phone] with questions or concerns regarding your invoice.   Our billing staff will not be able to assist you with questions regarding bills from these companies.  You will be contacted with the lab results as soon as they are available. The fastest way to get your results is to activate your My Chart account. Instructions are located on the last page of this paperwork. If you have not heard from [place] regarding the results in 2 weeks, please contact this office.         Signed, Merri Ray, MD [facility]

## 2019-08-26 NOTE — Telephone Encounter (Signed)
Patient was seen in clinic on Friday by Dr Carlota Raspberry and swelling was improved. No further action needed.

## 2019-08-26 NOTE — Progress Notes (Signed)
  Subjective:  Patient ID: Kayla Lloyd, female    DOB: [birth year],  MRN: [id number]  Chief Complaint  Patient presents with  . Follow-up    Worsening edema - bilateral feet/ankles. C/o persistent pain from neuropathy and plantar fasciitis at night. Pt was unable to rate the pain.    86 y.o. female presents with the above complaint. History confirmed with patient.  Her legs continue to swell.  She is here today with her granddaughter.  She is started some diuretics which is an apparent dietary supplement for swelling, this was not prescribed by physician.  She tried capsaicin and camphor for neuropathy and plantar fasciitis.  It wakes her up at night.  She was on gabapentin but had to stop because it caused her bleeding.  She states she had procedures many years ago for varicose veins.   Objective:  Physical Exam: Palpable pedal pulses, she has +3 pitting edema throughout the bilateral lower extremities with severe peripheral venous insufficiency and varicosities noted.  No evidence of DVT.  No evidence of venous ulceration, although she does have a healed ulceration on the right leg.  Assessment:   1. Venous (peripheral) insufficiency   2. Edema of both lower extremities   3. Varicose veins of both lower extremities, unspecified whether complicated   4. Peripheral polyneuropathy   5. Plantar fasciitis      Plan:  Patient was evaluated and treated and all questions answered.  Discussed etiology and treatment options for peripheral venous insufficiency and chronic venous insufficiency.  I advised her there is not much that I can do within the foot and ankle itself, but I am happy to refer her to a vascular specialist.  I also advised her discuss potential diuretics that are prescription with her primary care doctor and to cease using the diuretics supplement as it appears to only have caffeine and magnesium supplementation.   For her plantar fasciitis pain she has plantar  foot braces at home and she should wear this as soon as the swelling will allow.  Follow-up with Dr. Adah Perl for further nail care as planned.  Lanae Crumbly, DPM 08/26/2019      Return if symptoms worsen or fail to improve.

## 2019-08-27 ENCOUNTER — Ambulatory Visit: Payer: Medicare HMO | Admitting: Family Medicine

## 2019-08-27 ENCOUNTER — Encounter: Payer: Self-pay | Admitting: Family Medicine

## 2019-08-28 LAB — URINE CULTURE

## 2019-09-01 ENCOUNTER — Encounter: Payer: Self-pay | Admitting: Family Medicine

## 2019-09-01 ENCOUNTER — Encounter: Payer: Self-pay | Admitting: Podiatry

## 2019-09-01 DIAGNOSIS — I872 Venous insufficiency (chronic) (peripheral): Secondary | ICD-10-CM

## 2019-09-01 DIAGNOSIS — I8393 Asymptomatic varicose veins of bilateral lower extremities: Secondary | ICD-10-CM

## 2019-09-01 DIAGNOSIS — R600 Localized edema: Secondary | ICD-10-CM

## 2019-09-01 DIAGNOSIS — G629 Polyneuropathy, unspecified: Secondary | ICD-10-CM

## 2019-09-03 NOTE — Telephone Encounter (Signed)
Faxed required form, clinicals and demographics to [facility]. 

## 2019-09-04 ENCOUNTER — Encounter: Payer: Self-pay | Admitting: Registered Nurse

## 2019-09-04 ENCOUNTER — Other Ambulatory Visit: Payer: Self-pay

## 2019-09-04 ENCOUNTER — Ambulatory Visit (INDEPENDENT_AMBULATORY_CARE_PROVIDER_SITE_OTHER): Payer: Medicare HMO | Admitting: Registered Nurse

## 2019-09-04 VITALS — BP 111/71 | HR 89 | Temp 97.9°F | Resp 18 | Ht 59.0 in

## 2019-09-04 DIAGNOSIS — R300 Dysuria: Secondary | ICD-10-CM

## 2019-09-04 DIAGNOSIS — M25511 Pain in right shoulder: Secondary | ICD-10-CM

## 2019-09-04 DIAGNOSIS — G8929 Other chronic pain: Secondary | ICD-10-CM | POA: Diagnosis not present

## 2019-09-04 LAB — POCT URINALYSIS DIP (CLINITEK)
Bilirubin, UA: NEGATIVE
Glucose, UA: NEGATIVE mg/dL
Ketones, POC UA: NEGATIVE mg/dL
Nitrite, UA: NEGATIVE
POC PROTEIN,UA: NEGATIVE
Spec Grav, UA: <=1.005 — AB (ref 1.010–1.025)
Urobilinogen, UA: 0.2 E.U./dL
pH, UA: 6 (ref 5.0–8.0)

## 2019-09-04 MED ORDER — GABAPENTIN 100 MG PO CAPS: 100.0000 mg | ORAL_CAPSULE | Freq: Two times a day (BID) | ORAL | 3 refills | Status: DC

## 2019-09-04 MED ORDER — PREDNISONE 5 MG PO TABS: ORAL_TABLET | ORAL | 0 refills | Status: AC

## 2019-09-04 NOTE — Progress Notes (Signed)
Established Patient Office Visit  Subjective:  Patient ID: Kayla Lloyd, female    DOB: 12-07-1923  Age: 86 y.o. MRN: [id number]  CC:  Chief Complaint  Patient presents with   Follow-up    for her left leg swelling to check how the healing process is going . Per patient a walker has fell on the top of her foot as well so now it has hit the top of her foot bone.   Medication Problem    patient states she would like to discuss medication.    HPI Kayla Lloyd presents for wound follow up, UTI follow up, and pain management.  Wound: walker fell on her R leg a few weeks ago - having significant bruising, redness, and occasional stabbing pain. No heat, warmth, worsening swelling, worsening redness, or other concerns for DVT. No hx of dvt. Appears to be stable to improving. Has been monitoring for infection, no purulent drainage  UTI: symptoms improving. Finished double dose of keflex, has returned to single dose. No complaints or concerns. No systemic symptoms.   Pain management: Using BC powder one packet daily rather than tramadol for pain relief. Has a history of GERD with esophagitis. Granddaughter states family is very worried about bleeding risk, but pt states nothing else works. Of note, has a history of allergies or intolerances to codeine, meloxicam, oxycodone, pregabalin, gabapentin, and sulfa drugs. Requesting something with a rapid onset of action.   Past Medical History:  Diagnosis Date   Bowel obstruction ([place]) 1950   Cancer of septum of nose ([place])    melanoma   Hypertension    Hypothyroidism     Past Surgical History:  Procedure Laterality Date   BIOPSY  12/07/2017   Procedure: BIOPSY;  Surgeon: Rush Landmark Telford Nab., MD;  Location: [facility];  Service: Gastroenterology;;   CHOLECYSTECTOMY     ESOPHAGOGASTRODUODENOSCOPY (EGD) WITH PROPOFOL N/A 12/07/2017   Procedure: ESOPHAGOGASTRODUODENOSCOPY (EGD) WITH PROPOFOL;  Surgeon:  Irving Copas., MD;  Location: [facility];  Service: Gastroenterology;  Laterality: N/A;  May need Dilation   hysterecrtomy     left hip surgery     ROTATOR CUFF REPAIR Right    SMALL INTESTINE SURGERY      Family History  Problem Relation Age of Onset   Uterine cancer Mother    Hypertension Father    Colon cancer Neg Hx    Esophageal cancer Neg Hx    Liver disease Neg Hx    Inflammatory bowel disease Neg Hx    Pancreatic cancer Neg Hx    Rectal cancer Neg Hx    Stomach cancer Neg Hx     Social History   Socioeconomic History   Marital status: Widowed    Spouse name: Not on file   Number of children: 8   Years of education: Not on file   Highest education level: Not on file  Occupational History   Not on file  Tobacco Use   Smoking status: Never Smoker   Smokeless tobacco: Never Used  Vaping Use   Vaping Use: Never used  Substance and Sexual Activity   Alcohol use: No    Comment: wine/rare   Drug use: No   Sexual activity: Not Currently  Other Topics Concern   Not on file  Social History Narrative   Not on file   Social Determinants of Health   Financial Resource Strain:    Difficulty of Paying Living Expenses:   Food Insecurity:    Worried  About Running Out of Food in the Last Year:    Ran Out of Food in the Last Year:   Transportation Needs:    Film/video editor (Medical):    Lack of Transportation (Non-Medical):   Physical Activity:    Days of Exercise per Week:    Minutes of Exercise per Session:   Stress:    Feeling of Stress :   Social Connections:    Frequency of Communication with Friends and Family:    Frequency of Social Gatherings with Friends and Family:    Attends Religious Services:    Active Member of Clubs or Organizations:    Attends Music therapist:    Marital Status:   Intimate Partner Violence:    Fear of Current or Ex-Partner:    Emotionally Abused:     Physically Abused:    Sexually Abused:     Outpatient Medications Prior to Visit  Medication Sig Dispense Refill   ALPRAZolam (XANAX) 0.25 MG tablet Take 1 tablet (0.25 mg total) by mouth 3 (three) times daily. 90 tablet 5   BC FAST PAIN RELIEF 650-195-33.3 MG PACK Take 1 packet by mouth daily as needed (for pain.).     betamethasone valerate ointment (VALISONE) 0.1 % 1 application.     bisacodyl (DULCOLAX) 5 MG EC tablet Take 5 mg by mouth daily as needed (for constipation.).      castor oil liquid Take 15 mLs by mouth daily as needed for moderate constipation.     cephALEXin (KEFLEX) 250 MG/5ML suspension Take 10 mLs (500 mg total) by mouth 2 (two) times daily. For 1 week. 200 mL 0   CEQUA 0.09 % SOLN Apply 1 drop to eye 2 (two) times daily.     cetirizine (ZYRTEC) 10 MG tablet Take 1 tablet (10 mg total) by mouth daily. 90 tablet 3   Cholecalciferol (VITAMIN D3) 50 MCG (2000 UT) TABS Take 2,000 Units by mouth daily.     clindamycin (CLEOCIN) 2 % vaginal cream      docusate sodium (DULCOLAX) 100 MG capsule Take 100 mg by mouth 2 (two) times daily as needed (for constipation).      esomeprazole (NEXIUM) 40 MG capsule Take 1 capsule (40 mg total) by mouth 2 (two) times daily. 60 capsule 6   ferrous sulfate 325 (65 FE) MG EC tablet Take by mouth.     FLUAD QUADRIVALENT 0.5 ML injection      Ginkgo Biloba 120 MG CAPS Take 120 mg by mouth daily.     hydrocortisone cream 1 % Apply 1 application topically 2 (two) times daily.     Lactobacillus (PROBIOTIC ACIDOPHILUS PO) Take 1 capsule by mouth daily.     levothyroxine (SYNTHROID) 75 MCG tablet TAKE 1 TABLET BY MOUTH EVERY DAY 90 tablet 2   Liniments (SALONPAS PAIN RELIEF PATCH EX) Place 1 patch onto the skin daily as needed (for shoulder pain.).     loperamide (IMODIUM) 2 MG capsule Take by mouth as needed for diarrhea or loose stools.     losartan (COZAAR) 100 MG tablet TAKE 1 TABLET BY MOUTH EVERY DAY 90 tablet 2    Magnesium 500 MG TABS Take 500 mg by mouth 3 (three) times a week.     magnesium gluconate (MAGONATE) 500 MG tablet Take by mouth.     Menthol, Topical Analgesic, 154 MG PADS Place 1 each onto the skin daily as needed (for hip pain.).     pantoprazole (PROTONIX) 40 MG tablet  Take 1 tablet (40 mg total) by mouth 2 (two) times daily. 90 tablet 3   phenazopyridine (PYRIDIUM) 200 MG tablet Take 1 tablet (200 mg total) by mouth 3 (three) times daily as needed for pain (urethral spasm). 10 tablet 1   PROLIA 60 MG/ML SOSY injection Take 60 mg by mouth every 6 (six) months.  0   Pumpkin Seed-Soy Germ (AZO BLADDER CONTROL/GO-LESS PO) Take 1 tablet by mouth daily as needed (for bladder pain.).     RESTASIS 0.05 % ophthalmic emulsion      silver sulfADIAZINE (SILVADENE) 1 % cream Apply to affected area 6 times daily as needed     simvastatin (ZOCOR) 40 MG tablet Take 1 tablet (40 mg total) by mouth daily. 30 tablet 0   terconazole (TERAZOL 7) 0.4 % vaginal cream      triamcinolone (KENALOG) 0.025 % ointment Apply 1 application topically 2 (two) times daily. 30 g 0   trolamine salicylate (ASPERCREME) 10 % cream Apply 1 application topically 3 (three) times daily as needed for muscle pain.     vitamin C (ASCORBIC ACID) 500 MG tablet Take 500 mg by mouth daily.     traMADol (ULTRAM) 50 MG tablet TAKE 1 TABLET BY MOUTH 3 TIMES DAILY AS NEEDED FOR PAIN (Patient not taking: Reported on 09/04/2019) 60 tablet 2   No facility-administered medications prior to visit.    Allergies  Allergen Reactions   Brimonidine    Codeine Other (See Comments)   Levofloxacin Other (See Comments)   Lidocaine    Meloxicam    Oxycodone    Pregabalin    Sulfa Antibiotics Other (See Comments)   Timolol Maleate    Zolpidem     ROS Review of Systems Pertinent positives and negatives in HPI   Objective:    Physical Exam Vitals and nursing note reviewed.  Constitutional:      General: She is not  in acute distress.    Appearance: Normal appearance. She is normal weight. She is not ill-appearing, toxic-appearing or diaphoretic.  HENT:     Head: Normocephalic and atraumatic.  Cardiovascular:     Rate and Rhythm: Normal rate and regular rhythm.  Musculoskeletal:        General: Swelling and tenderness present. Normal range of motion.     Left lower leg: Edema present.  Skin:    General: Skin is warm and dry.     Capillary Refill: Capillary refill takes less than 2 seconds.     Coloration: Skin is not jaundiced or pale.     Findings: No bruising, erythema, lesion or rash.  Neurological:     General: No focal deficit present.     Mental Status: She is alert and oriented to person, place, and time. Mental status is at baseline.     Cranial Nerves: No cranial nerve deficit.  Psychiatric:        Mood and Affect: Mood normal.        Behavior: Behavior normal.        Thought Content: Thought content normal.        Judgment: Judgment normal.         BP 111/71    Pulse 89    Temp 97.9 F (36.6 C) (Temporal)    Resp 18    Ht 4\' 11"  (1.499 m)    SpO2 95%    BMI 23.23 kg/m  Wt Readings from Last 3 Encounters:  08/24/19 115 lb (52.2 kg)  06/26/19 112 lb (  50.8 kg)  03/20/19 117 lb (53.1 kg)     Health Maintenance Due  Topic Date Due   COVID-19 Vaccine (1) Never done   INFLUENZA VACCINE  08/26/2019    There are no preventive care reminders to display for this patient.  Lab Results  Component Value Date   TSH 0.716 06/26/2019   Lab Results  Component Value Date   WBC 7.0 06/26/2019   HGB 13.9 06/26/2019   HCT 42.1 06/26/2019   MCV 95 06/26/2019   PLT 185 06/26/2019   Lab Results  Component Value Date   NA 134 08/07/2019   K 4.1 08/07/2019   CO2 24 08/07/2019   GLUCOSE 76 08/07/2019   BUN 13 08/07/2019   CREATININE 0.66 08/07/2019   BILITOT 0.6 08/07/2019   ALKPHOS 257 (H) 08/07/2019   AST 23 08/07/2019   ALT 15 08/07/2019   PROT 5.9 (L) 08/07/2019    ALBUMIN 3.9 08/07/2019   CALCIUM 9.0 08/07/2019   Lab Results  Component Value Date   CHOL 252 (H) 06/26/2019   Lab Results  Component Value Date   HDL 94 06/26/2019   Lab Results  Component Value Date   LDLCALC 144 (H) 06/26/2019   Lab Results  Component Value Date   TRIG 84 06/26/2019   Lab Results  Component Value Date   CHOLHDL 2.7 06/26/2019   No results found for: HGBA1C    Assessment & Plan:   Problem List Items Addressed This Visit      Other   Dysuria - Primary   Relevant Orders   POCT URINALYSIS DIP (CLINITEK) (Completed)   Urine Culture    Other Visit Diagnoses    Chronic right shoulder pain       Relevant Medications   gabapentin (NEURONTIN) 100 MG capsule   predniSONE (DELTASONE) 5 MG tablet      Meds ordered this encounter  Medications   gabapentin (NEURONTIN) 100 MG capsule    Sig: Take 1 capsule (100 mg total) by mouth 2 (two) times daily.    Dispense:  90 capsule    Refill:  3    Order Specific Question:   Supervising Provider    Answer:   Carlota Raspberry, JEFFREY R [[id number]]   predniSONE (DELTASONE) 5 MG tablet    Sig: Take 4 tablets (20 mg total) by mouth daily with breakfast for 2 days, THEN 2 tablets (10 mg total) daily with breakfast for 2 days, THEN 1 tablet (5 mg total) daily with breakfast for 2 days.    Dispense:  14 tablet    Refill:  0    Order Specific Question:   Supervising Provider    Answer:   Carlota Raspberry, JEFFREY R [[id number]]    Follow-up: No follow-ups on file.   PLAN  Prednisone taper  Gabapentin entered in error - intolerance not listed on chart. Listed during this encounter.  Try regular scheduling of tramadol doses to prevent pain rather than treat it  Will refer to ortho and pain management given her ongoing interest in addressing her shoulder pain as well as her many intolerances and bleeding risk that limit our treatment options.  Per patient's granddaughter, requesting home health for help with medication management,  wound monitoring, and ensuring safe activities at home given pt recent accidents - walker and cane have both fallen and hit her leg.   Do not use BC powder - bleeding risk too high.   POCT UA shows no nitrites, still leuks. Cannot guarantee clean catch -  will send culture. Continue keflex and monitor for symptoms  Patient encouraged to call clinic with any questions, comments, or concerns.  Maximiano Coss, NP

## 2019-09-04 NOTE — Patient Instructions (Addendum)
Ms Kayla Lloyd to meet you today. In brief:  Let's have you do the following for pain.  Stop BC powder Take a prednisone taper of 20mg -20-10-10-5-5. This should get you ahead of the pain. Then Start gabapentin 100mg  2-3 times daily. This should help with shoulder pain.  In addition, today we: Determined on a urine dip in the office that your urine is looking better. Are sending out a culture to make sure there is no more bacteria. Continue adequate use of estrogen cream to help prevent future UTIs.  Lastly, Keep an eye on your wound. If it looks infected, let me know ASAP. If the redness spreads, it starts to feel hot, or starts to throb, let me know. Any other concerns, I would be happy to see you - as would Drs. Gerhard Perches, or [place].  Thank you, let me know if you have any questions,  Kathrin Ruddy, NP  If you have lab work done today you will be contacted with your lab results within the next 2 weeks.  If you have not heard from [place] then please contact us. The fastest way to get your results is to register for My Chart.   IF you received an x-ray today, you will receive an invoice from [facility]. Please contact [facility] at [phone] with questions or concerns regarding your invoice.   IF you received labwork today, you will receive an invoice from [place]. Please contact [facility] at [phone] with questions or concerns regarding your invoice.   Our billing staff will not be able to assist you with questions regarding bills from these companies.  You will be contacted with the lab results as soon as they are available. The fastest way to get your results is to activate your My Chart account. Instructions are located on the last page of this paperwork. If you have not heard from [place] regarding the results in 2 weeks, please contact this office.

## 2019-09-05 ENCOUNTER — Encounter: Payer: Self-pay | Admitting: Registered Nurse

## 2019-09-06 ENCOUNTER — Other Ambulatory Visit: Payer: Self-pay

## 2019-09-06 ENCOUNTER — Encounter: Payer: Self-pay | Admitting: Family Medicine

## 2019-09-06 ENCOUNTER — Ambulatory Visit: Payer: Medicare HMO | Admitting: Family Medicine

## 2019-09-06 MED ORDER — TRAMADOL HCL 50 MG PO TABS: ORAL_TABLET | ORAL | 2 refills | Status: DC

## 2019-09-06 NOTE — Telephone Encounter (Signed)
Pt son sent a message again asking about the referral to get home help, pt son also asked about the alternative pain medication that was discussed at their visit No orders in please advise

## 2019-09-06 NOTE — Progress Notes (Signed)
Down to 25-50k - I am assuming that these will be pseudomonas as well. Worth increasing the keflex again, or given her history of intolerances would it be more prudent to see about getting her in with urology?  Thanks -   Kathrin Ruddy, NP

## 2019-09-07 ENCOUNTER — Encounter: Payer: Self-pay | Admitting: Family Medicine

## 2019-09-07 LAB — URINE CULTURE

## 2019-09-07 NOTE — Telephone Encounter (Signed)
You treated pt for swelling in her leg, pt is complaining of continued pain in it especially when first standing up is this normal? Should she just wait and see if this subsides?

## 2019-09-10 ENCOUNTER — Encounter: Payer: Self-pay | Admitting: Family Medicine

## 2019-09-10 NOTE — Telephone Encounter (Signed)
-----   Message from Maximiano Coss, NP sent at 09/10/2019  4:31 PM EDT ----- Regarding: Check in on patient Kayla Lloyd -  If you wouldn't mind giving Kayla Lloyd a call to see how she's doing regarding her UTI and pain, that would be great.  Thank you  Kathrin Ruddy, NP ----- Message ----- From: Wendie Agreste, MD Sent: 09/06/2019   8:11 PM EDT To: Maximiano Coss, NP  For some reason I was not able to get back into your message.  Likely the 25-50,000 would be Pseudomonas, and that would not have the best coverage with Keflex. However at her age typically would treat with acute dysuria, new or worsening urgency/frequency, gross hematuria, new incontinence or systemic symptoms.  It did not sound like she was having that when she saw you, and with symptom improvement could be reasonable to watch the culture and close follow-up phone call.  Let me know if we need to do that and can have [place] [facility] call her tomorrow.  Thanks.  JG

## 2019-09-10 NOTE — Telephone Encounter (Signed)
I have called pt and she informed me that her UTI is still present. It has gotten better but at night it still wakes her up at night and she thinks the Keflex liquid has help though.   Please advise what recommendations you would like for me to relay to her.

## 2019-09-10 NOTE — Telephone Encounter (Signed)
Pt asking if theyre to take prednisone and tramadol at the same time or stop tramadol until prednisone is done

## 2019-09-11 ENCOUNTER — Encounter: Payer: Self-pay | Admitting: Family Medicine

## 2019-09-13 ENCOUNTER — Other Ambulatory Visit: Payer: Self-pay

## 2019-09-13 ENCOUNTER — Ambulatory Visit (INDEPENDENT_AMBULATORY_CARE_PROVIDER_SITE_OTHER): Payer: Medicare HMO | Admitting: Family Medicine

## 2019-09-13 VITALS — BP 136/71 | HR 93 | Temp 97.7°F | Resp 15 | Ht 59.0 in | Wt 114.4 lb

## 2019-09-13 DIAGNOSIS — S80922A Unspecified superficial injury of left lower leg, initial encounter: Secondary | ICD-10-CM

## 2019-09-13 DIAGNOSIS — Z87440 Personal history of urinary (tract) infections: Secondary | ICD-10-CM | POA: Diagnosis not present

## 2019-09-13 DIAGNOSIS — T148XXA Other injury of unspecified body region, initial encounter: Secondary | ICD-10-CM

## 2019-09-13 MED ORDER — CEPHALEXIN 250 MG/5ML PO SUSR: 125.0000 mg | Freq: Every day | ORAL | 1 refills | Status: DC

## 2019-09-13 MED ORDER — HYDROCORTISONE 0.5 % EX CREA
1.0000 | TOPICAL_CREAM | Freq: Two times a day (BID) | CUTANEOUS | 0 refills | Status: DC
Start: 2019-09-13 — End: 2019-11-29

## 2019-09-13 MED ORDER — FUROSEMIDE 20 MG PO TABS: 10.0000 mg | ORAL_TABLET | Freq: Every day | ORAL | 0 refills | Status: DC

## 2019-09-13 NOTE — Patient Instructions (Signed)
° ° ° °  If you have lab work done today you will be contacted with your lab results within the next 2 weeks.  If you have not heard from us then please contact us. The fastest way to get your results is to register for My Chart. ° ° °IF you received an x-ray today, you will receive an invoice from [facility]. Please contact [facility] at [phone] with questions or concerns regarding your invoice.  ° °IF you received labwork today, you will receive an invoice from [facility]. Please contact [facility] at [phone] with questions or concerns regarding your invoice.  ° °Our billing staff will not be able to assist you with questions regarding bills from these companies. ° °You will be contacted with the lab results as soon as they are available. The fastest way to get your results is to activate your My Chart account. Instructions are located on the last page of this paperwork. If you have not heard from us regarding the results in 2 weeks, please contact this office. °  ° ° ° °

## 2019-09-13 NOTE — Progress Notes (Signed)
8/19/20214:53 PM  Kayla Lloyd 1923-06-28, 86 y.o., female [id number]  Chief Complaint  Patient presents with  . Edema    pt has been struggling with edema in her legs and ankles, pt now has a open wound, pt had her walker dropped on her foot and caused a tear and lot of bruising, pt is no longer taking aspirin, pt daughter wants her tramadol explained as theres a reaction between that and xanax,   . Hematuria    pt daughter wants her on low dose antibiotic daily due to frequent UTI   . medication review    pt daughter would also like to go over a pill schedule with you so that she is sure everything is at the right times     HPI:   Patient is a 86 y.o. female with past medical history significant for HTN, HLP, b12 deficiency, arthritis. Osteoporosis, hypothyroidism, GERD, OAB with vaginal atrophy, plantar fasscitis  who presents today for hematoma on her left leg  Aug 10th, her walker fell on her left leg causing blood blister and large hematoma along her ant shin Has been applying light dressing painful  Last OV with me June 2021 She has seen several providers since then for acute concerns  She also needs refill of keflex which she takes daily for UTI prophylaxis and low dose hydrocortisone which she uses prn externally for vaginal irritation  Lab Results  Component Value Date   CREATININE 0.66 08/07/2019   BUN 13 08/07/2019   NA 134 08/07/2019   K 4.1 08/07/2019   CL 98 08/07/2019   CO2 24 08/07/2019   Lab Results  Component Value Date   ALT 15 08/07/2019   AST 23 08/07/2019   GGT 298 (H) 08/07/2019   ALKPHOS 257 (H) 08/07/2019   BILITOT 0.6 08/07/2019   Lab Results  Component Value Date   TSH 0.716 06/26/2019    Depression screen PHQ 2/9 09/13/2019 09/04/2019 08/24/2019  Decreased Interest 0 0 0  Down, Depressed, Hopeless 0 0 0  PHQ - 2 Score 0 0 0  Altered sleeping - - -  Tired, decreased energy - - -  Change in appetite - - -  Feeling bad or  failure about yourself  - - -  Trouble concentrating - - -  Moving slowly or fidgety/restless - - -  Suicidal thoughts - - -  PHQ-9 Score - - -  Difficult doing work/chores - - -    Fall Risk  09/13/2019 09/04/2019 08/24/2019 06/26/2019 04/24/2019  Falls in the past year? 0 0 0 0 0  Number falls in past yr: 0 0 0 0 0  Injury with Fall? 0 0 0 0 0  Risk for fall due to : No Fall Risks - - - -  Follow up Falls evaluation completed Falls evaluation completed Falls evaluation completed - Falls evaluation completed     Allergies  Allergen Reactions  . Brimonidine   . Codeine Other (See Comments)  . Levofloxacin Other (See Comments)  . Lidocaine   . Meloxicam   . Oxycodone   . Pregabalin   . Sulfa Antibiotics Other (See Comments)  . Timolol Maleate   . Zolpidem   . Gabapentin Rash    Pt did not like the way it made her feel    Prior to Admission medications   Medication Sig Start Date End Date Taking? Authorizing Provider  ALPRAZolam (XANAX) 0.25 MG tablet Take 1 tablet (0.25 mg total) by mouth  3 (three) times daily. 06/26/19  Yes Rutherford Guys, MD  BC FAST PAIN RELIEF [phone] MG PACK Take 1 packet by mouth daily as needed (for pain.).   Yes [provider]  betamethasone valerate ointment (VALISONE) 0.1 % 1 application. 02/28/19  Yes [provider]  bisacodyl (DULCOLAX) 5 MG EC tablet Take 5 mg by mouth daily as needed (for constipation.).  07/13/16  Yes [provider]  castor oil liquid Take 15 mLs by mouth daily as needed for moderate constipation.   Yes [provider]  cephALEXin (KEFLEX) 250 MG/5ML suspension Take 10 mLs (500 mg total) by mouth 2 (two) times daily. For 1 week. 08/24/19  Yes Wendie Agreste, MD  CEQUA 0.09 % SOLN Apply 1 drop to eye 2 (two) times daily. 10/10/18  Yes [provider]  cetirizine (ZYRTEC) 10 MG tablet Take 1 tablet (10 mg total) by mouth daily. 01/04/19  Yes Forrest Moron, MD  Cholecalciferol  (VITAMIN D3) 50 MCG (2000 UT) TABS Take 2,000 Units by mouth daily.   Yes [provider]  clindamycin (CLEOCIN) 2 % vaginal cream  07/17/19  Yes [provider]  docusate sodium (DULCOLAX) 100 MG capsule Take 100 mg by mouth 2 (two) times daily as needed (for constipation).  07/13/16  Yes [provider]  esomeprazole (NEXIUM) 40 MG capsule Take 1 capsule (40 mg total) by mouth 2 (two) times daily. 01/03/18  Yes Mansouraty, Telford Nab., MD  ferrous sulfate 325 (65 FE) MG EC tablet Take by mouth.   Yes [provider]  FLUAD QUADRIVALENT 0.5 ML injection  12/22/18  Yes [provider]  Ginkgo Biloba 120 MG CAPS Take 120 mg by mouth daily.   Yes [provider]  hydrocortisone cream 1 % Apply 1 application topically 2 (two) times daily.   Yes [provider]  Lactobacillus (PROBIOTIC ACIDOPHILUS PO) Take 1 capsule by mouth daily.   Yes [provider]  levothyroxine (SYNTHROID) 75 MCG tablet TAKE 1 TABLET BY MOUTH EVERY DAY 04/02/19  Yes Stallings, Zoe A, MD  Liniments (SALONPAS PAIN RELIEF PATCH EX) Place 1 patch onto the skin daily as needed (for shoulder pain.).   Yes [provider]  loperamide (IMODIUM) 2 MG capsule Take by mouth as needed for diarrhea or loose stools.   Yes [provider]  losartan (COZAAR) 100 MG tablet TAKE 1 TABLET BY MOUTH EVERY DAY 04/03/19  Yes Stallings, Zoe A, MD  Magnesium 500 MG TABS Take 500 mg by mouth 3 (three) times a week.   Yes [provider]  magnesium gluconate (MAGONATE) 500 MG tablet Take by mouth.   Yes [provider]  Menthol, Topical Analgesic, 154 MG PADS Place 1 each onto the skin daily as needed (for hip pain.).   Yes [provider]  pantoprazole (PROTONIX) 40 MG tablet Take 1 tablet (40 mg total) by mouth 2 (two) times daily. 05/14/19  Yes Forrest Moron, MD  phenazopyridine (PYRIDIUM) 200 MG tablet Take 1 tablet (200 mg total) by mouth  3 (three) times daily as needed for pain (urethral spasm). 03/02/18  Yes Constant, Peggy, MD  PROLIA 60 MG/ML SOSY injection Take 60 mg by mouth every 6 (six) months. 09/15/17  Yes [provider]  Pumpkin Seed-Soy Germ (AZO BLADDER CONTROL/GO-LESS PO) Take 1 tablet by mouth daily as needed (for bladder pain.).   Yes [provider]  RESTASIS 0.05 % ophthalmic emulsion  10/18/18  Yes [provider]  silver sulfADIAZINE (SILVADENE) 1 % cream Apply to affected area 6 times daily as needed 03/16/19  Yes [provider]  simvastatin (ZOCOR) 40 MG tablet Take 1 tablet (40 mg total) by mouth daily. 01/04/19  Yes Delia Chimes A, MD  terconazole (TERAZOL 7) 0.4 % vaginal cream  04/19/19  Yes [provider]  traMADol (ULTRAM) 50 MG tablet TAKE 1 TABLET BY MOUTH 3 TIMES DAILY AS NEEDED FOR PAIN 09/06/19  Yes Maximiano Coss, NP  triamcinolone (KENALOG) 0.025 % ointment Apply 1 application topically 2 (two) times daily. 02/05/19  Yes Forrest Moron, MD  trolamine salicylate (ASPERCREME) 10 % cream Apply 1 application topically 3 (three) times daily as needed for muscle pain.   Yes [provider]  vitamin C (ASCORBIC ACID) 500 MG tablet Take 500 mg by mouth daily.   Yes [provider]    Past Medical History:  Diagnosis Date  . Bowel obstruction ([place]) 1950  . Cancer of septum of nose (HCC)    melanoma  . Hypertension   . Hypothyroidism     Past Surgical History:  Procedure Laterality Date  . BIOPSY  12/07/2017   Procedure: BIOPSY;  Surgeon: Rush Landmark Telford Nab., MD;  Location: [facility];  Service: Gastroenterology;;  . CHOLECYSTECTOMY    . ESOPHAGOGASTRODUODENOSCOPY (EGD) WITH PROPOFOL N/A 12/07/2017   Procedure: ESOPHAGOGASTRODUODENOSCOPY (EGD) WITH PROPOFOL;  Surgeon: Rush Landmark Telford Nab., MD;  Location: [facility];  Service: Gastroenterology;  Laterality: N/A;  May need Dilation  . hysterecrtomy    . left hip surgery    .  ROTATOR CUFF REPAIR Right   . SMALL INTESTINE SURGERY      Social History   Tobacco Use  . Smoking status: Never Smoker  . Smokeless tobacco: Never Used  Substance Use Topics  . Alcohol use: No    Comment: wine/rare    Family History  Problem Relation Age of Onset  . Uterine cancer Mother   . Hypertension Father   . Colon cancer Neg Hx   . Esophageal cancer Neg Hx   . Liver disease Neg Hx   . Inflammatory bowel disease Neg Hx   . Pancreatic cancer Neg Hx   . Rectal cancer Neg Hx   . Stomach cancer Neg Hx     ROS Per hpi  OBJECTIVE:  Today's Vitals   09/13/19 1607  BP: 136/71  Pulse: 93  Resp: 15  Temp: 97.7 F (36.5 C)  TempSrc: Temporal  SpO2: 97%  Weight: 114 lb 6.4 oz (51.9 kg)  Height: 4\' 11"  (1.499 m)   Body mass index is 23.11 kg/m.   Physical Exam Vitals and nursing note reviewed.  Constitutional:      Appearance: She is well-developed.  HENT:     Head: Normocephalic and atraumatic.  Eyes:     General: No scleral icterus.    Conjunctiva/sclera: Conjunctivae normal.     Pupils: Pupils are equal, round, and reactive to light.  Pulmonary:     Effort: Pulmonary effort is normal.  Musculoskeletal:     Cervical back: Neck supple.  Skin:    General: Skin is warm and dry.  Neurological:     Mental Status: She is alert and oriented to person, place, and time.       Left anterior shin 10 x 6 cm  No results found for this or any previous visit (from the past 24 hour(s)).  No results found.   ASSESSMENT and PLAN  1. Traumatic  hematoma Small rx for lasix given degree of edema, cont with keeping area clean and dry. Referring to wound care clinic. - Ambulatory referral to Wound Clinic  2. History of recurrent UTI (urinary tract infection) - cephALEXin (KEFLEX) 250 MG/5ML suspension; Take 2.5 mLs (125 mg total) by mouth daily. For 1 week.  Other orders - hydrocortisone cream 0.5 %; Apply 1 application topically 2 (two) times daily. -  furosemide (LASIX) 20 MG tablet; Take 0.5 tablets (10 mg total) by mouth daily.   No follow-ups on file.    Rutherford Guys, MD Primary Care at [facility] [place], [place] [address] Ph.  [phone] Fax [phone]

## 2019-09-14 ENCOUNTER — Encounter: Payer: Self-pay | Admitting: Family Medicine

## 2019-09-17 ENCOUNTER — Encounter: Payer: Self-pay | Admitting: Family Medicine

## 2019-09-17 ENCOUNTER — Telehealth: Payer: Self-pay | Admitting: Family Medicine

## 2019-09-17 NOTE — Telephone Encounter (Signed)
They do not come in a 10mg  dose. I would advise she ask the pharmacist and if not she should be able to use a pill cutter. thanks

## 2019-09-17 NOTE — Telephone Encounter (Signed)
Spoke with Pharmacy they recommended the liquid version that is 10mg  per 1 mL otherwise they cannot cut it either stated too small to cut Jaqueline was also resistant to the pill cutter

## 2019-09-17 NOTE — Telephone Encounter (Signed)
Pt called stating that the medication, furosemide, she has not been able to successfully cut the medication in half. She is requesting to have a new prescription sent in and to have the pharmacy cut the pills in half. Please advise.      [facility] - [place], [place] - [address]  [address] [place] [address]  Phone: [phone] Fax: [phone]  Hours: Not open 24 hours

## 2019-09-17 NOTE — Telephone Encounter (Signed)
Pt is requesting furosemide be called in but have the pharmacy cut the tablets or call in different mg as she has been unable to cut these. I don't believe the pharmacy will cut these for her is there something else I should advise for them or is there a mg that fits her needs?

## 2019-09-18 MED ORDER — FUROSEMIDE 10 MG/ML PO SOLN: 10.0000 mg | Freq: Every day | ORAL | 2 refills | Status: DC

## 2019-09-18 NOTE — Telephone Encounter (Signed)
furosemide (LASIX) 20 MG tablet Pt has called back in on this script stating that she would like to have this is the liquid form that was mentioned, She would like for the liquid to be called in instead to  [facility], [place] - [address] Phone:  [phone]  Fax:  [phone]

## 2019-09-18 NOTE — Telephone Encounter (Signed)
Will you be okay with putting pt on the liquid form of this medication?

## 2019-09-18 NOTE — Telephone Encounter (Signed)
I did not know it came liquid. I sent in liquid [facility] to [facility]. thanks

## 2019-09-18 NOTE — Telephone Encounter (Signed)
Rx changes awaiting PCP review- patient has called back for follow up to request

## 2019-09-19 NOTE — Telephone Encounter (Signed)
Informed patient that the medication was sent to the pharmacy.

## 2019-09-21 ENCOUNTER — Encounter: Payer: Self-pay | Admitting: Family Medicine

## 2019-09-21 ENCOUNTER — Telehealth: Payer: Self-pay | Admitting: Family Medicine

## 2019-09-21 DIAGNOSIS — H04123 Dry eye syndrome of bilateral lacrimal glands: Secondary | ICD-10-CM | POA: Diagnosis not present

## 2019-09-21 DIAGNOSIS — H35373 Puckering of macula, bilateral: Secondary | ICD-10-CM | POA: Diagnosis not present

## 2019-09-21 DIAGNOSIS — H353133 Nonexudative age-related macular degeneration, bilateral, advanced atrophic without subfoveal involvement: Secondary | ICD-10-CM | POA: Diagnosis not present

## 2019-09-21 NOTE — Telephone Encounter (Signed)
Pharmacy called and was to verify the quantity on the pts Rx listed below. Please advise.  furosemide (LASIX) 10 MG/ML solution [[id number]]

## 2019-09-21 NOTE — Telephone Encounter (Signed)
Called and clarified with pharmacy they will dispense a 60 mL bottle to equal a 60 days supply pt can pick up later today

## 2019-09-23 ENCOUNTER — Encounter: Payer: Self-pay | Admitting: Family Medicine

## 2019-09-24 ENCOUNTER — Other Ambulatory Visit: Payer: Self-pay

## 2019-09-24 ENCOUNTER — Encounter: Payer: Self-pay | Admitting: Family Medicine

## 2019-09-24 ENCOUNTER — Ambulatory Visit (INDEPENDENT_AMBULATORY_CARE_PROVIDER_SITE_OTHER): Payer: Medicare HMO | Admitting: Family Medicine

## 2019-09-24 VITALS — BP 116/71 | HR 95 | Temp 97.3°F | Ht 59.0 in | Wt 111.6 lb

## 2019-09-24 DIAGNOSIS — L03116 Cellulitis of left lower limb: Secondary | ICD-10-CM | POA: Diagnosis not present

## 2019-09-24 DIAGNOSIS — T148XXA Other injury of unspecified body region, initial encounter: Secondary | ICD-10-CM

## 2019-09-24 DIAGNOSIS — S8992XD Unspecified injury of left lower leg, subsequent encounter: Secondary | ICD-10-CM

## 2019-09-24 MED ORDER — DOXYCYCLINE CALCIUM 50 MG/5ML PO SYRP: 100.0000 mg | ORAL_SOLUTION | Freq: Two times a day (BID) | ORAL | 0 refills | Status: DC

## 2019-09-24 MED ORDER — DOXYCYCLINE CALCIUM 50 MG/5ML PO SYRP: 100.0000 mg | ORAL_SOLUTION | Freq: Two times a day (BID) | ORAL | 0 refills | Status: AC

## 2019-09-24 NOTE — Telephone Encounter (Signed)
Seen in clinic today.

## 2019-09-24 NOTE — Progress Notes (Signed)
8/30/202111:30 AM  Kayla Lloyd [birth year], 86 y.o., female [id number]  Chief Complaint  Patient presents with  . Follow-up    vaginal discomfort, using the premarin and uses the vasoline with that, works very well. Right ankle swelling, and grand daughter is asking about cymbalta over the gabapentin and zanax    HPI:   Patient is a 86 y.o. female with past medical history significant for HTN, HLP, b12 deficiency, arthritis. Osteoporosis, hypothyroidism, GERD, OAB with vaginal atrophy, plantar fasscitiswho presents today for concerns regarding wound  Patient seen almost 2 weeks ago for a traumatic hematoma, started on low dose lasix and referred to wound care  Patient here with granddaughter Wound care appt not until the end of sept She has not started lasix Reports leg is more swollen, red and tender  No bleeding or drainage from wound other than occ scant clear fluid  No fever or chills Prefers liquid rx as struggles with swallowing pills  Depression screen [facility] 2/9 09/13/2019 09/04/2019 08/24/2019  Decreased Interest 0 0 0  Down, Depressed, Hopeless 0 0 0  PHQ - 2 Score 0 0 0  Altered sleeping - - -  Tired, decreased energy - - -  Change in appetite - - -  Feeling bad or failure about yourself  - - -  Trouble concentrating - - -  Moving slowly or fidgety/restless - - -  Suicidal thoughts - - -  PHQ-9 Score - - -  Difficult doing work/chores - - -    Fall Risk  09/13/2019 09/04/2019 08/24/2019 06/26/2019 04/24/2019  Falls in the past year? 0 0 0 0 0  Number falls in past yr: 0 0 0 0 0  Injury with Fall? 0 0 0 0 0  Risk for fall due to : No Fall Risks - - - -  Follow up Falls evaluation completed Falls evaluation completed Falls evaluation completed - Falls evaluation completed     Allergies  Allergen Reactions  . Brimonidine   . Codeine Other (See Comments)  . Levofloxacin Other (See Comments)  . Lidocaine   . Meloxicam   . Oxycodone   . Pregabalin    . Sulfa Antibiotics Other (See Comments)  . Timolol Maleate   . Zolpidem   . Gabapentin Rash    Pt did not like the way it made her feel    Prior to Admission medications   Medication Sig Start Date End Date Taking? Authorizing Provider  ALPRAZolam (XANAX) 0.25 MG tablet Take 1 tablet (0.25 mg total) by mouth 3 (three) times daily. 06/26/19  Yes Rutherford Guys, MD  betamethasone valerate ointment (VALISONE) 0.1 % 1 application. 02/28/19  Yes [provider]  cephALEXin (KEFLEX) 250 MG/5ML suspension Take 2.5 mLs (125 mg total) by mouth daily. For 1 week. 09/13/19  Yes Rutherford Guys, MD  CEQUA 0.09 % SOLN Apply 1 drop to eye 2 (two) times daily. 10/10/18  Yes [provider]  cetirizine (ZYRTEC) 10 MG tablet Take 1 tablet (10 mg total) by mouth daily. 01/04/19  Yes Forrest Moron, MD  Cholecalciferol (VITAMIN D3) 50 MCG (2000 UT) TABS Take 2,000 Units by mouth daily.   Yes [provider]  docusate sodium (DULCOLAX) 100 MG capsule Take 100 mg by mouth 2 (two) times daily as needed (for constipation).  07/13/16  Yes [provider]  ferrous sulfate 325 (65 FE) MG EC tablet Take by mouth.   Yes [provider]  FLUAD QUADRIVALENT 0.5  ML injection  12/22/18  Yes [provider]  furosemide (LASIX) 10 MG/ML solution Take 1 mL (10 mg total) by mouth daily. 09/18/19  Yes Rutherford Guys, MD  Ginkgo Biloba 120 MG CAPS Take 120 mg by mouth daily.   Yes [provider]  hydrocortisone cream 0.5 % Apply 1 application topically 2 (two) times daily. 09/13/19  Yes Rutherford Guys, MD  Lactobacillus (PROBIOTIC ACIDOPHILUS PO) Take 1 capsule by mouth daily.   Yes [provider]  levothyroxine (SYNTHROID) 75 MCG tablet TAKE 1 TABLET BY MOUTH EVERY DAY 04/02/19  Yes Stallings, Zoe A, MD  Liniments (SALONPAS PAIN RELIEF PATCH EX) Place 1 patch onto the skin daily as needed (for shoulder pain.).   Yes [provider]  loperamide  (IMODIUM) 2 MG capsule Take by mouth as needed for diarrhea or loose stools.   Yes [provider]  losartan (COZAAR) 100 MG tablet TAKE 1 TABLET BY MOUTH EVERY DAY 04/03/19  Yes Stallings, Zoe A, MD  Magnesium 500 MG TABS Take 500 mg by mouth 3 (three) times a week.   Yes [provider]  magnesium gluconate (MAGONATE) 500 MG tablet Take by mouth.   Yes [provider]  Menthol, Topical Analgesic, 154 MG PADS Place 1 each onto the skin daily as needed (for hip pain.).   Yes [provider]  pantoprazole (PROTONIX) 40 MG tablet Take 1 tablet (40 mg total) by mouth 2 (two) times daily. 05/14/19  Yes Forrest Moron, MD  phenazopyridine (PYRIDIUM) 200 MG tablet Take 1 tablet (200 mg total) by mouth 3 (three) times daily as needed for pain (urethral spasm). 03/02/18  Yes Constant, Peggy, MD  PROLIA 60 MG/ML SOSY injection Take 60 mg by mouth every 6 (six) months. 09/15/17  Yes [provider]  Pumpkin Seed-Soy Germ (AZO BLADDER CONTROL/GO-LESS PO) Take 1 tablet by mouth daily as needed (for bladder pain.).   Yes [provider]  RESTASIS 0.05 % ophthalmic emulsion  10/18/18  Yes [provider]  silver sulfADIAZINE (SILVADENE) 1 % cream Apply to affected area 6 times daily as needed 03/16/19  Yes [provider]  traMADol (ULTRAM) 50 MG tablet TAKE 1 TABLET BY MOUTH 3 TIMES DAILY AS NEEDED FOR PAIN 09/06/19  Yes Maximiano Coss, NP  trolamine salicylate (ASPERCREME) 10 % cream Apply 1 application topically 3 (three) times daily as needed for muscle pain.   Yes [provider]  vitamin C (ASCORBIC ACID) 500 MG tablet Take 500 mg by mouth daily.   Yes [provider]    Past Medical History:  Diagnosis Date  . Bowel obstruction ([place]) 1950  . Cancer of septum of nose (HCC)    melanoma  . Hypertension   . Hypothyroidism     Past Surgical History:  Procedure Laterality Date  . BIOPSY  12/07/2017   Procedure: BIOPSY;   Surgeon: Rush Landmark Telford Nab., MD;  Location: [facility];  Service: Gastroenterology;;  . CHOLECYSTECTOMY    . ESOPHAGOGASTRODUODENOSCOPY (EGD) WITH PROPOFOL N/A 12/07/2017   Procedure: ESOPHAGOGASTRODUODENOSCOPY (EGD) WITH PROPOFOL;  Surgeon: Rush Landmark Telford Nab., MD;  Location: [facility];  Service: Gastroenterology;  Laterality: N/A;  May need Dilation  . hysterecrtomy    . left hip surgery    . ROTATOR CUFF REPAIR Right   . SMALL INTESTINE SURGERY      Social History   Tobacco Use  . Smoking status: Never Smoker  . Smokeless tobacco: Never Used  Substance Use  Topics  . Alcohol use: No    Comment: wine/rare    Family History  Problem Relation Age of Onset  . Uterine cancer Mother   . Hypertension Father   . Colon cancer Neg Hx   . Esophageal cancer Neg Hx   . Liver disease Neg Hx   . Inflammatory bowel disease Neg Hx   . Pancreatic cancer Neg Hx   . Rectal cancer Neg Hx   . Stomach cancer Neg Hx     ROS Per hpi  OBJECTIVE:  Today's Vitals   09/24/19 1130  BP: 116/71  Pulse: 95  Temp: (!) 97.3 F (36.3 C)  SpO2: 96%  Weight: 111 lb 9.6 oz (50.6 kg)  Height: 4\' 11"  (1.499 m)   Body mass index is 22.54 kg/m.   Physical Exam       No results found for this or any previous visit (from the past 24 hour(s)).  No results found.   ASSESSMENT and PLAN  1. Cellulitis of left anterior lower leg 2. Traumatic hematoma - Ambulatory referral to Plastic Surgery  Worse. Wound care needed. Referring to plastics as wound care too far out. Start lasix as prescribed. Start doxy for cellulitis. Cont keeping area clean and dry. RTC precautions given.   Other orders - doxycycline (VIBRAMYCIN) 50 MG/5ML SYRP; Take 10 mLs (100 mg total) by mouth 2 (two) times daily for 7 days.  No follow-ups on file.    Rutherford Guys, MD Primary Care at [facility], [place] [address] Ph.  [phone] Fax [phone]

## 2019-09-24 NOTE — Patient Instructions (Signed)
° ° ° °  If you have lab work done today you will be contacted with your lab results within the next 2 weeks.  If you have not heard from us then please contact us. The fastest way to get your results is to register for My Chart. ° ° °IF you received an x-ray today, you will receive an invoice from [facility]. Please contact [facility] at [phone] with questions or concerns regarding your invoice.  ° °IF you received labwork today, you will receive an invoice from [facility]. Please contact [facility] at [phone] with questions or concerns regarding your invoice.  ° °Our billing staff will not be able to assist you with questions regarding bills from these companies. ° °You will be contacted with the lab results as soon as they are available. The fastest way to get your results is to activate your My Chart account. Instructions are located on the last page of this paperwork. If you have not heard from us regarding the results in 2 weeks, please contact this office. °  ° ° ° °

## 2019-09-24 NOTE — Telephone Encounter (Signed)
Pt leg is worsening, pt daughter included new photos of the wound, what should I advise?

## 2019-09-26 ENCOUNTER — Encounter: Payer: Self-pay | Admitting: Family Medicine

## 2019-09-27 ENCOUNTER — Encounter: Payer: Self-pay | Admitting: Family Medicine

## 2019-09-30 ENCOUNTER — Encounter: Payer: Self-pay | Admitting: Family Medicine

## 2019-09-30 ENCOUNTER — Encounter: Payer: Self-pay | Admitting: *Deleted

## 2019-09-30 ENCOUNTER — Ambulatory Visit
Admission: EM | Admit: 2019-09-30 | Discharge: 2019-09-30 | Disposition: A | Discharge status: Home / Self Care | Payer: Medicare HMO | Attending: Physician Assistant | Admitting: Physician Assistant

## 2019-09-30 ENCOUNTER — Other Ambulatory Visit: Payer: Self-pay

## 2019-09-30 ENCOUNTER — Ambulatory Visit (INDEPENDENT_AMBULATORY_CARE_PROVIDER_SITE_OTHER): Payer: Medicare HMO

## 2019-09-30 DIAGNOSIS — S81802A Unspecified open wound, left lower leg, initial encounter: Secondary | ICD-10-CM

## 2019-09-30 DIAGNOSIS — L03116 Cellulitis of left lower limb: Secondary | ICD-10-CM | POA: Diagnosis not present

## 2019-09-30 DIAGNOSIS — R600 Localized edema: Secondary | ICD-10-CM | POA: Diagnosis not present

## 2019-09-30 DIAGNOSIS — Z5189 Encounter for other specified aftercare: Secondary | ICD-10-CM | POA: Diagnosis not present

## 2019-09-30 DIAGNOSIS — X58XXXA Exposure to other specified factors, initial encounter: Secondary | ICD-10-CM

## 2019-09-30 MED ORDER — CEFTRIAXONE SODIUM 1 G IJ SOLR
1.0000 g | Freq: Once | INTRAMUSCULAR | Status: AC
  Administered 2019-09-30: 1 g via INTRAMUSCULAR

## 2019-09-30 NOTE — ED Provider Notes (Signed)
[place]    CSN: [id number] Arrival date & time: 09/30/19  1217      History   Chief Complaint Chief Complaint  Patient presents with  . Wound Check    HPI Kayla Lloyd is a 86 y.o. female.   86 year old female comes in with family member for wound check.  Sustained traumatic wound 2 months ago with hematoma.  Has been followed by PCP.  Was on Keflex for urinary symptoms 09/13/2019.  Noticed further redness 09/24/2019, and was put on doxycycline.  On last day of doxycycline.  Patient denies fever, spreading erythema, increased pain.  However, scabbing was accidentally removed, and now with gaping wound, and therefore came in for evaluation.     Past Medical History:  Diagnosis Date  . Bowel obstruction ([place]) 1950  . Cancer of septum of nose (HCC)    melanoma  . Hypertension   . Hypothyroidism     Patient Active Problem List   Diagnosis Date Noted  . Vaginitis, atrophic 05/02/2019  . Need for prophylactic vaccination against Streptococcus pneumoniae (pneumococcus) 04/24/2019  . Vaginitis and vulvovaginitis 04/03/2019  . Chronic UTI 04/03/2019  . Dysphagia 10/17/2017  . Abnormal barium swallow 10/17/2017  . Gastroesophageal reflux disease 10/17/2017  . OAB (overactive bladder) 09/21/2017  . Pharyngeal dysphagia 07/11/2017  . Urinary incontinence in female 11/30/2016  . Pelvic relaxation 11/30/2016  . Dysuria 11/30/2016  . Vaginal atrophy 11/30/2016  . Essential hypertension 02/06/2014  . Thyroid activity decreased 02/06/2014  . Edema 05/03/2013  . Anemia 10/29/2011  . Arthritis 10/29/2011  . B12 deficiency 10/29/2011  . Gastro-esophageal reflux disease with esophagitis 10/29/2011  . Hyperlipidemia 10/29/2011  . Seasonal allergies 10/29/2011    Past Surgical History:  Procedure Laterality Date  . BIOPSY  12/07/2017   Procedure: BIOPSY;  Surgeon: Rush Landmark Telford Nab., MD;  Location: [facility];  Service: Gastroenterology;;  .  CHOLECYSTECTOMY    . ESOPHAGOGASTRODUODENOSCOPY (EGD) WITH PROPOFOL N/A 12/07/2017   Procedure: ESOPHAGOGASTRODUODENOSCOPY (EGD) WITH PROPOFOL;  Surgeon: Rush Landmark Telford Nab., MD;  Location: [facility];  Service: Gastroenterology;  Laterality: N/A;  May need Dilation  . hysterecrtomy    . left hip surgery    . ROTATOR CUFF REPAIR Right   . SMALL INTESTINE SURGERY      OB History    Gravida  8   Para  8   Term  8   Preterm  0   AB  0   Living  8     SAB  0   TAB  0   Ectopic  0   Multiple  0   Live Births  8            Home Medications    Prior to Admission medications   Medication Sig Start Date End Date Taking? Authorizing Provider  ALPRAZolam (XANAX) 0.25 MG tablet Take 1 tablet (0.25 mg total) by mouth 3 (three) times daily. 06/26/19  Yes Rutherford Guys, MD  betamethasone valerate ointment (VALISONE) 0.1 % 1 application. 02/28/19  Yes [provider]  cephALEXin (KEFLEX) 250 MG/5ML suspension Take 2.5 mLs (125 mg total) by mouth daily. For 1 week. 09/13/19  Yes Rutherford Guys, MD  CEQUA 0.09 % SOLN Apply 1 drop to eye 2 (two) times daily. 10/10/18  Yes [provider]  cetirizine (ZYRTEC) 10 MG tablet Take 1 tablet (10 mg total) by mouth daily. 01/04/19  Yes Forrest Moron, MD  Cholecalciferol (VITAMIN D3) 50 MCG (2000 UT)  TABS Take 2,000 Units by mouth daily.   Yes [provider]  docusate sodium (DULCOLAX) 100 MG capsule Take 100 mg by mouth 2 (two) times daily as needed (for constipation).  07/13/16  Yes [provider]  doxycycline (VIBRAMYCIN) 50 MG/5ML SYRP Take 10 mLs (100 mg total) by mouth 2 (two) times daily for 7 days. 09/24/19 10/01/19 Yes Rutherford Guys, MD  ferrous sulfate 325 (65 FE) MG EC tablet Take by mouth.   Yes [provider]  furosemide (LASIX) 10 MG/ML solution Take 1 mL (10 mg total) by mouth daily. 09/18/19  Yes Rutherford Guys, MD  Lactobacillus (PROBIOTIC ACIDOPHILUS PO) Take 1 capsule  by mouth daily.   Yes [provider]  levothyroxine (SYNTHROID) 75 MCG tablet TAKE 1 TABLET BY MOUTH EVERY DAY 04/02/19  Yes Stallings, Zoe A, MD  losartan (COZAAR) 100 MG tablet TAKE 1 TABLET BY MOUTH EVERY DAY 04/03/19  Yes Stallings, Zoe A, MD  Magnesium 500 MG TABS Take 500 mg by mouth 3 (three) times a week.   Yes [provider]  pantoprazole (PROTONIX) 40 MG tablet Take 1 tablet (40 mg total) by mouth 2 (two) times daily. 05/14/19  Yes Forrest Moron, MD  PROLIA 60 MG/ML SOSY injection Take 60 mg by mouth every 6 (six) months. 09/15/17  Yes [provider]  RESTASIS 0.05 % ophthalmic emulsion  10/18/18  Yes [provider]  traMADol (ULTRAM) 50 MG tablet TAKE 1 TABLET BY MOUTH 3 TIMES DAILY AS NEEDED FOR PAIN 09/06/19  Yes Maximiano Coss, NP  FLUAD QUADRIVALENT 0.5 ML injection  12/22/18   [provider]  Ginkgo Biloba 120 MG CAPS Take 120 mg by mouth daily.    [provider]  hydrocortisone cream 0.5 % Apply 1 application topically 2 (two) times daily. 09/13/19   Rutherford Guys, MD  Liniments [facility] PAIN RELIEF PATCH EX) Place 1 patch onto the skin daily as needed (for shoulder pain.).    [provider]  loperamide (IMODIUM) 2 MG capsule Take by mouth as needed for diarrhea or loose stools.    [provider]  magnesium gluconate (MAGONATE) 500 MG tablet Take by mouth.    [provider]  Menthol, Topical Analgesic, 154 MG PADS Place 1 each onto the skin daily as needed (for hip pain.).    [provider]  phenazopyridine (PYRIDIUM) 200 MG tablet Take 1 tablet (200 mg total) by mouth 3 (three) times daily as needed for pain (urethral spasm). 03/02/18   Constant, Peggy, MD  Pumpkin Seed-Soy Germ (AZO BLADDER CONTROL/GO-LESS PO) Take 1 tablet by mouth daily as needed (for bladder pain.).    [provider]  silver sulfADIAZINE (SILVADENE) 1 % cream Apply to affected area 6 times daily as needed  03/16/19   [provider]  trolamine salicylate (ASPERCREME) 10 % cream Apply 1 application topically 3 (three) times daily as needed for muscle pain.    [provider]  vitamin C (ASCORBIC ACID) 500 MG tablet Take 500 mg by mouth daily.    [provider]    Family History Family History  Problem Relation Age of Onset  . Uterine cancer Mother   . Hypertension Father   . Colon cancer Neg Hx   . Esophageal cancer Neg Hx   . Liver disease Neg Hx   . Inflammatory bowel disease Neg Hx   . Pancreatic cancer Neg Hx   . Rectal cancer Neg Hx   .  Stomach cancer Neg Hx     Social History Social History   Tobacco Use  . Smoking status: Never Smoker  . Smokeless tobacco: Never Used  Vaping Use  . Vaping Use: Never used  Substance Use Topics  . Alcohol use: No    Comment: wine/rare  . Drug use: No     Allergies   Brimonidine, Codeine, Levofloxacin, Lidocaine, Meloxicam, Oxycodone, Pregabalin, Sulfa antibiotics, Timolol maleate, Zolpidem, and Gabapentin   Review of Systems Review of Systems  Reason unable to perform ROS: See HPI as above.     Physical Exam Triage Vital Signs ED Triage Vitals  Enc Vitals Group     BP 09/30/19 1409 135/79     Pulse Rate 09/30/19 1409 85     Resp 09/30/19 1409 16     Temp 09/30/19 1409 (!) 96.7 F (35.9 C)     Temp Source 09/30/19 1409 Temporal     SpO2 09/30/19 1409 96 %     Weight --      Height --      Head Circumference --      Peak Flow --      Pain Score 09/30/19 1417 0     Pain Loc --      Pain Edu? --      Excl. in [place]? --    No data found.  Updated Vital Signs BP 135/79 (BP Location: Left Arm)   Pulse 85   Temp (!) 96.7 F (35.9 C) (Temporal)   Resp 16   SpO2 96%   Physical Exam Constitutional:      General: She is not in acute distress.    Appearance: Normal appearance. She is well-developed. She is not toxic-appearing or diaphoretic.  HENT:     Head: Normocephalic and atraumatic.    Eyes:     Conjunctiva/sclera: Conjunctivae normal.     Pupils: Pupils are equal, round, and reactive to light.  Pulmonary:     Effort: Pulmonary effort is normal. No respiratory distress.  Musculoskeletal:     Cervical back: Normal range of motion and neck supple.  Skin:    General: Skin is warm and dry.  Neurological:     Mental Status: She is alert and oriented to person, place, and time.        UC Treatments / Results  Labs (all labs ordered are listed, but only abnormal results are displayed) Labs Reviewed - No data to display  EKG   Radiology DG Tibia/Fibula Left  Result Date: 09/30/2019 CLINICAL DATA:  Chronic wound. Left lower leg wound after trauma in July 2021. EXAM: LEFT TIBIA AND FIBULA - 2 VIEW COMPARISON:  08/24/2019 FINDINGS: No fracture. No bone lesion. Skeletal structures are demineralized. There is no bone resorption to suggest osteomyelitis. Knee and ankle joints are normally aligned. Soft tissue wound/defect is noted along the medial aspect of the lower leg. No radiopaque foreign body. There is also diffuse subcutaneous edema that extends from the mid leg through the ankle. IMPRESSION: 1. Prominent soft tissue wound/defect along the medial aspect of the lower leg with surrounding soft tissue edema/cellulitis. 2. No fracture or bone lesion.  No evidence of osteomyelitis. Electronically Signed   By: Lajean Manes M.D.   On: 09/30/2019 14:52    Procedures Procedures (including critical care time)  Medications Ordered in UC Medications  cefTRIAXone (ROCEPHIN) injection 1 g (1 g Intramuscular Given 09/30/19 1521)    Initial Impression / Assessment and Plan / UC Course  I  have reviewed the triage vital signs and the nursing notes.  Pertinent labs & imaging results that were available during my care of the patient were reviewed by me and considered in my medical decision making (see chart for details).    X-ray negative for osteomyelitis.  Patient afebrile,  nontoxic.  Will provide Rocephin injection, and patient given initial doxycycline.  Follow-up with wound care as scheduled.  Return precautions given.  Patient and family member expresses understanding and agrees plan.  Final Clinical Impressions(s) / UC Diagnoses   Final diagnoses:  Visit for wound check    ED Prescriptions    None     PDMP not reviewed this encounter.   Ok Edwards, PA-C 10/01/19 [phone]

## 2019-09-30 NOTE — ED Triage Notes (Signed)
Per family, pt sustained wound from blunt trauma to left lower leg in July 2021; has seen PCP couple times; has initial appt with wound care center on 9/14. C/O wound scab coming off last night, revealing "a hole".

## 2019-09-30 NOTE — Discharge Instructions (Signed)
Xray did not show any infection to the bones. Finish doxycycline as directed. Keep wound clean and dry. Dress at least once a day. Follow up with wound care as scheduled. If fever, worsening redness, go to the emergency department for further evaluation.

## 2019-10-02 NOTE — Telephone Encounter (Signed)
ED note available if you wish to review they also attached new picture of the wound.

## 2019-10-02 NOTE — Telephone Encounter (Signed)
Should she continue the Lasix reports leg/foot is still swollen.

## 2019-10-04 ENCOUNTER — Encounter: Payer: Self-pay | Admitting: Family Medicine

## 2019-10-04 MED ORDER — DULOXETINE HCL 20 MG PO CPEP: 20.0000 mg | ORAL_CAPSULE | Freq: Every day | ORAL | 3 refills | Status: DC

## 2019-10-04 NOTE — Telephone Encounter (Signed)
Pt now ready to try cymbalta

## 2019-10-09 ENCOUNTER — Ambulatory Visit (INDEPENDENT_AMBULATORY_CARE_PROVIDER_SITE_OTHER): Payer: Medicare HMO | Admitting: Plastic Surgery

## 2019-10-09 ENCOUNTER — Other Ambulatory Visit: Payer: Self-pay

## 2019-10-09 ENCOUNTER — Telehealth: Payer: Self-pay

## 2019-10-09 ENCOUNTER — Encounter: Payer: Self-pay | Admitting: Plastic Surgery

## 2019-10-09 DIAGNOSIS — S81802A Unspecified open wound, left lower leg, initial encounter: Secondary | ICD-10-CM | POA: Insufficient documentation

## 2019-10-09 NOTE — Telephone Encounter (Signed)
Order for wound/dressing faxed to [facility]- per Dr. Marla Roe Copy of this order & wound care instructions scanned to chart

## 2019-10-09 NOTE — Progress Notes (Signed)
Patient ID: Kayla Lloyd, female    DOB: January 07, 1924, 86 y.o.   MRN: [id number]   Chief Complaint  Patient presents with  . Skin Problem    The patient is a 86 year old white female here with her granddaughter for evaluation of her left leg.  Two weeks ago the patient bumped her leg and sustained a large hematoma.  Over the following days the area developed into a necrotic area with a thick eschar.  There was concern about infection and she was seen 3 days ago at urgent care.  Antibiotics was started.  She is 4 feet 11 inches tall.  The left leg is extremely swollen with pitting edema and red.  The actual wound does not appear infected.  There is a lot of periwound swelling and irritation.  The wound is 4.5 x 6.5 cm in size and is full-thickness with a depth of 1.5 cm.  There does not appear to be a lot of drainage at this moment.  She is otherwise in good health and is not taking the aspirin any longer.   Review of Systems  Constitutional: Positive for activity change. Negative for appetite change.  Eyes: Negative.   Respiratory: Negative.   Cardiovascular: Negative.   Musculoskeletal: Positive for gait problem.  Psychiatric/Behavioral: Negative.     Past Medical History:  Diagnosis Date  . Bowel obstruction ([place]) 1950  . Cancer of septum of nose (HCC)    melanoma  . Hypertension   . Hypothyroidism     Past Surgical History:  Procedure Laterality Date  . BIOPSY  12/07/2017   Procedure: BIOPSY;  Surgeon: Rush Landmark Telford Nab., MD;  Location: [facility];  Service: Gastroenterology;;  . CHOLECYSTECTOMY    . ESOPHAGOGASTRODUODENOSCOPY (EGD) WITH PROPOFOL N/A 12/07/2017   Procedure: ESOPHAGOGASTRODUODENOSCOPY (EGD) WITH PROPOFOL;  Surgeon: Rush Landmark Telford Nab., MD;  Location: [facility];  Service: Gastroenterology;  Laterality: N/A;  May need Dilation  . hysterecrtomy    . left hip surgery    . ROTATOR CUFF REPAIR Right   . SMALL INTESTINE SURGERY         Current Outpatient Medications:  .  ALPRAZolam (XANAX) 0.25 MG tablet, Take 1 tablet (0.25 mg total) by mouth 3 (three) times daily., Disp: 90 tablet, Rfl: 5 .  betamethasone valerate ointment (VALISONE) 0.1 %, 1 application., Disp: , Rfl:  .  cephALEXin (KEFLEX) 250 MG/5ML suspension, Take 2.5 mLs (125 mg total) by mouth daily. For 1 week., Disp: 200 mL, Rfl: 1 .  CEQUA 0.09 % SOLN, Apply 1 drop to eye 2 (two) times daily., Disp: , Rfl:  .  cetirizine (ZYRTEC) 10 MG tablet, Take 1 tablet (10 mg total) by mouth daily., Disp: 90 tablet, Rfl: 3 .  Cholecalciferol (VITAMIN D3) 50 MCG (2000 UT) TABS, Take 2,000 Units by mouth daily., Disp: , Rfl:  .  docusate sodium (DULCOLAX) 100 MG capsule, Take 100 mg by mouth 2 (two) times daily as needed (for constipation). , Disp: , Rfl:  .  DULoxetine (CYMBALTA) 20 MG capsule, Take 1 capsule (20 mg total) by mouth daily., Disp: 30 capsule, Rfl: 3 .  ferrous sulfate 325 (65 FE) MG EC tablet, Take by mouth., Disp: , Rfl:  .  FLUAD QUADRIVALENT 0.5 ML injection, , Disp: , Rfl:  .  furosemide (LASIX) 10 MG/ML solution, Take 1 mL (10 mg total) by mouth daily., Disp: 5 mL, Rfl: 2 .  Ginkgo Biloba 120 MG CAPS, Take 120 mg by mouth daily.,  Disp: , Rfl:  .  hydrocortisone cream 0.5 %, Apply 1 application topically 2 (two) times daily., Disp: 30 g, Rfl: 0 .  Lactobacillus (PROBIOTIC ACIDOPHILUS PO), Take 1 capsule by mouth daily., Disp: , Rfl:  .  levothyroxine (SYNTHROID) 75 MCG tablet, TAKE 1 TABLET BY MOUTH EVERY DAY, Disp: 90 tablet, Rfl: 2 .  Liniments (SALONPAS PAIN RELIEF PATCH EX), Place 1 patch onto the skin daily as needed (for shoulder pain.)., Disp: , Rfl:  .  loperamide (IMODIUM) 2 MG capsule, Take by mouth as needed for diarrhea or loose stools., Disp: , Rfl:  .  losartan (COZAAR) 100 MG tablet, TAKE 1 TABLET BY MOUTH EVERY DAY, Disp: 90 tablet, Rfl: 2 .  Magnesium 500 MG TABS, Take 500 mg by mouth 3 (three) times a week., Disp: , Rfl:  .  magnesium  gluconate (MAGONATE) 500 MG tablet, Take by mouth., Disp: , Rfl:  .  Menthol, Topical Analgesic, 154 MG PADS, Place 1 each onto the skin daily as needed (for hip pain.)., Disp: , Rfl:  .  pantoprazole (PROTONIX) 40 MG tablet, Take 1 tablet (40 mg total) by mouth 2 (two) times daily., Disp: 90 tablet, Rfl: 3 .  phenazopyridine (PYRIDIUM) 200 MG tablet, Take 1 tablet (200 mg total) by mouth 3 (three) times daily as needed for pain (urethral spasm)., Disp: 10 tablet, Rfl: 1 .  PROLIA 60 MG/ML SOSY injection, Take 60 mg by mouth every 6 (six) months., Disp: , Rfl: 0 .  Pumpkin Seed-Soy Germ (AZO BLADDER CONTROL/GO-LESS PO), Take 1 tablet by mouth daily as needed (for bladder pain.)., Disp: , Rfl:  .  RESTASIS 0.05 % ophthalmic emulsion, , Disp: , Rfl:  .  silver sulfADIAZINE (SILVADENE) 1 % cream, Apply to affected area 6 times daily as needed, Disp: , Rfl:  .  traMADol (ULTRAM) 50 MG tablet, TAKE 1 TABLET BY MOUTH 3 TIMES DAILY AS NEEDED FOR PAIN, Disp: 60 tablet, Rfl: 2 .  trolamine salicylate (ASPERCREME) 10 % cream, Apply 1 application topically 3 (three) times daily as needed for muscle pain., Disp: , Rfl:  .  vitamin C (ASCORBIC ACID) 500 MG tablet, Take 500 mg by mouth daily., Disp: , Rfl:    Objective:   Vitals:   10/09/19 1316  BP: 123/77  Pulse: 83  Temp: (!) 97.4 F (36.3 C)  SpO2: 96%    Physical Exam Vitals and nursing note reviewed.  Constitutional:      Appearance: Normal appearance.  HENT:     Head: Normocephalic and atraumatic.  Cardiovascular:     Rate and Rhythm: Normal rate.     Pulses: Normal pulses.  Pulmonary:     Effort: Pulmonary effort is normal.  Musculoskeletal:       Legs:  Neurological:     General: No focal deficit present.     Mental Status: She is alert.  Psychiatric:        Mood and Affect: Mood normal.        Behavior: Behavior normal.     Assessment & Plan:  Wound of left lower extremity, initial encounter  Continue with local dressing  care for now recommending Vaseline and Kerlix Ace wrap.  Keep the leg elevated as much as able.  I will arrange for OR.  Also continue with protein supplements.  Recommend irrigation debridement with ACell placement to left leg wound.  Pictures were obtained of the patient and placed in the chart with the patient's or guardian's permission.  [place], DO

## 2019-10-09 NOTE — H&P (View-Only) (Signed)
Patient ID: Kayla Lloyd, female    DOB: 1924/01/03, 86 y.o.   MRN: [id number]   Chief Complaint  Patient presents with  . Skin Problem    The patient is a 86 year old white female here with her granddaughter for evaluation of her left leg.  Two weeks ago the patient bumped her leg and sustained a large hematoma.  Over the following days the area developed into a necrotic area with a thick eschar.  There was concern about infection and she was seen 3 days ago at urgent care.  Antibiotics was started.  She is 4 feet 11 inches tall.  The left leg is extremely swollen with pitting edema and red.  The actual wound does not appear infected.  There is a lot of periwound swelling and irritation.  The wound is 4.5 x 6.5 cm in size and is full-thickness with a depth of 1.5 cm.  There does not appear to be a lot of drainage at this moment.  She is otherwise in good health and is not taking the aspirin any longer.   Review of Systems  Constitutional: Positive for activity change. Negative for appetite change.  Eyes: Negative.   Respiratory: Negative.   Cardiovascular: Negative.   Musculoskeletal: Positive for gait problem.  Psychiatric/Behavioral: Negative.     Past Medical History:  Diagnosis Date  . Bowel obstruction ([place]) 1950  . Cancer of septum of nose (HCC)    melanoma  . Hypertension   . Hypothyroidism     Past Surgical History:  Procedure Laterality Date  . BIOPSY  12/07/2017   Procedure: BIOPSY;  Surgeon: Rush Landmark Telford Nab., MD;  Location: [facility];  Service: Gastroenterology;;  . CHOLECYSTECTOMY    . ESOPHAGOGASTRODUODENOSCOPY (EGD) WITH PROPOFOL N/A 12/07/2017   Procedure: ESOPHAGOGASTRODUODENOSCOPY (EGD) WITH PROPOFOL;  Surgeon: Rush Landmark Telford Nab., MD;  Location: [facility];  Service: Gastroenterology;  Laterality: N/A;  May need Dilation  . hysterecrtomy    . left hip surgery    . ROTATOR CUFF REPAIR Right   . SMALL INTESTINE SURGERY         Current Outpatient Medications:  .  ALPRAZolam (XANAX) 0.25 MG tablet, Take 1 tablet (0.25 mg total) by mouth 3 (three) times daily., Disp: 90 tablet, Rfl: 5 .  betamethasone valerate ointment (VALISONE) 0.1 %, 1 application., Disp: , Rfl:  .  cephALEXin (KEFLEX) 250 MG/5ML suspension, Take 2.5 mLs (125 mg total) by mouth daily. For 1 week., Disp: 200 mL, Rfl: 1 .  CEQUA 0.09 % SOLN, Apply 1 drop to eye 2 (two) times daily., Disp: , Rfl:  .  cetirizine (ZYRTEC) 10 MG tablet, Take 1 tablet (10 mg total) by mouth daily., Disp: 90 tablet, Rfl: 3 .  Cholecalciferol (VITAMIN D3) 50 MCG (2000 UT) TABS, Take 2,000 Units by mouth daily., Disp: , Rfl:  .  docusate sodium (DULCOLAX) 100 MG capsule, Take 100 mg by mouth 2 (two) times daily as needed (for constipation). , Disp: , Rfl:  .  DULoxetine (CYMBALTA) 20 MG capsule, Take 1 capsule (20 mg total) by mouth daily., Disp: 30 capsule, Rfl: 3 .  ferrous sulfate 325 (65 FE) MG EC tablet, Take by mouth., Disp: , Rfl:  .  FLUAD QUADRIVALENT 0.5 ML injection, , Disp: , Rfl:  .  furosemide (LASIX) 10 MG/ML solution, Take 1 mL (10 mg total) by mouth daily., Disp: 5 mL, Rfl: 2 .  Ginkgo Biloba 120 MG CAPS, Take 120 mg by mouth daily.,  Disp: , Rfl:  .  hydrocortisone cream 0.5 %, Apply 1 application topically 2 (two) times daily., Disp: 30 g, Rfl: 0 .  Lactobacillus (PROBIOTIC ACIDOPHILUS PO), Take 1 capsule by mouth daily., Disp: , Rfl:  .  levothyroxine (SYNTHROID) 75 MCG tablet, TAKE 1 TABLET BY MOUTH EVERY DAY, Disp: 90 tablet, Rfl: 2 .  Liniments (SALONPAS PAIN RELIEF PATCH EX), Place 1 patch onto the skin daily as needed (for shoulder pain.)., Disp: , Rfl:  .  loperamide (IMODIUM) 2 MG capsule, Take by mouth as needed for diarrhea or loose stools., Disp: , Rfl:  .  losartan (COZAAR) 100 MG tablet, TAKE 1 TABLET BY MOUTH EVERY DAY, Disp: 90 tablet, Rfl: 2 .  Magnesium 500 MG TABS, Take 500 mg by mouth 3 (three) times a week., Disp: , Rfl:  .  magnesium  gluconate (MAGONATE) 500 MG tablet, Take by mouth., Disp: , Rfl:  .  Menthol, Topical Analgesic, 154 MG PADS, Place 1 each onto the skin daily as needed (for hip pain.)., Disp: , Rfl:  .  pantoprazole (PROTONIX) 40 MG tablet, Take 1 tablet (40 mg total) by mouth 2 (two) times daily., Disp: 90 tablet, Rfl: 3 .  phenazopyridine (PYRIDIUM) 200 MG tablet, Take 1 tablet (200 mg total) by mouth 3 (three) times daily as needed for pain (urethral spasm)., Disp: 10 tablet, Rfl: 1 .  PROLIA 60 MG/ML SOSY injection, Take 60 mg by mouth every 6 (six) months., Disp: , Rfl: 0 .  Pumpkin Seed-Soy Germ (AZO BLADDER CONTROL/GO-LESS PO), Take 1 tablet by mouth daily as needed (for bladder pain.)., Disp: , Rfl:  .  RESTASIS 0.05 % ophthalmic emulsion, , Disp: , Rfl:  .  silver sulfADIAZINE (SILVADENE) 1 % cream, Apply to affected area 6 times daily as needed, Disp: , Rfl:  .  traMADol (ULTRAM) 50 MG tablet, TAKE 1 TABLET BY MOUTH 3 TIMES DAILY AS NEEDED FOR PAIN, Disp: 60 tablet, Rfl: 2 .  trolamine salicylate (ASPERCREME) 10 % cream, Apply 1 application topically 3 (three) times daily as needed for muscle pain., Disp: , Rfl:  .  vitamin C (ASCORBIC ACID) 500 MG tablet, Take 500 mg by mouth daily., Disp: , Rfl:    Objective:   Vitals:   10/09/19 1316  BP: 123/77  Pulse: 83  Temp: (!) 97.4 F (36.3 C)  SpO2: 96%    Physical Exam Vitals and nursing note reviewed.  Constitutional:      Appearance: Normal appearance.  HENT:     Head: Normocephalic and atraumatic.  Cardiovascular:     Rate and Rhythm: Normal rate.     Pulses: Normal pulses.  Pulmonary:     Effort: Pulmonary effort is normal.  Musculoskeletal:       Legs:  Neurological:     General: No focal deficit present.     Mental Status: She is alert.  Psychiatric:        Mood and Affect: Mood normal.        Behavior: Behavior normal.     Assessment & Plan:  Wound of left lower extremity, initial encounter  Continue with local dressing  care for now recommending Vaseline and Kerlix Ace wrap.  Keep the leg elevated as much as able.  I will arrange for OR.  Also continue with protein supplements.  Recommend irrigation debridement with ACell placement to left leg wound.  Pictures were obtained of the patient and placed in the chart with the patient's or guardian's permission.  [place]ven, DO

## 2019-10-10 ENCOUNTER — Telehealth: Payer: Self-pay

## 2019-10-10 ENCOUNTER — Telehealth: Payer: Self-pay | Admitting: Plastic Surgery

## 2019-10-10 NOTE — Telephone Encounter (Signed)
Returned call to pt's granddaughter- [name]: She had a question regarding the dressing/wound care They received the supplies from [employer]- & they were uncertain about when to use the Hydrogel dressing I instructed her to use that after the surgery but for now they will use the vaseline/adaptic/gauze/kerlix & tape [name] understands the plan & she will assist with the dressing change as ordered.

## 2019-10-10 NOTE — Telephone Encounter (Signed)
See note on 10/10/19

## 2019-10-10 NOTE — Telephone Encounter (Signed)
Granddaughter, [name], called because she has a question regarding wound care order for patient. Please call to advise. [phone]

## 2019-10-11 ENCOUNTER — Encounter: Payer: Self-pay | Admitting: Plastic Surgery

## 2019-10-12 ENCOUNTER — Other Ambulatory Visit: Payer: Self-pay

## 2019-10-12 ENCOUNTER — Encounter (HOSPITAL_BASED_OUTPATIENT_CLINIC_OR_DEPARTMENT_OTHER): Payer: Self-pay | Admitting: Plastic Surgery

## 2019-10-15 ENCOUNTER — Encounter: Payer: Self-pay | Admitting: Family Medicine

## 2019-10-15 ENCOUNTER — Other Ambulatory Visit (HOSPITAL_COMMUNITY)
Admission: RE | Admit: 2019-10-15 | Discharge: 2019-10-15 | Disposition: A | Discharge status: Home / Self Care | Payer: Medicare HMO | Source: Ambulatory Visit | Attending: Plastic Surgery | Admitting: Plastic Surgery

## 2019-10-15 ENCOUNTER — Encounter (HOSPITAL_BASED_OUTPATIENT_CLINIC_OR_DEPARTMENT_OTHER)
Admission: RE | Admit: 2019-10-15 | Discharge: 2019-10-15 | Disposition: A | Discharge status: Home / Self Care | Payer: Medicare HMO | Source: Ambulatory Visit | Attending: Plastic Surgery | Admitting: Plastic Surgery

## 2019-10-15 DIAGNOSIS — Z01818 Encounter for other preprocedural examination: Secondary | ICD-10-CM | POA: Insufficient documentation

## 2019-10-15 DIAGNOSIS — Z01812 Encounter for preprocedural laboratory examination: Secondary | ICD-10-CM | POA: Diagnosis present

## 2019-10-15 DIAGNOSIS — Z01810 Encounter for preprocedural cardiovascular examination: Secondary | ICD-10-CM | POA: Insufficient documentation

## 2019-10-15 DIAGNOSIS — Z20822 Contact with and (suspected) exposure to covid-19: Secondary | ICD-10-CM | POA: Diagnosis not present

## 2019-10-15 LAB — SARS CORONAVIRUS 2 (TAT 6-24 HRS): SARS Coronavirus 2: NEGATIVE

## 2019-10-15 NOTE — Progress Notes (Signed)
Surgical soap given with instructions.

## 2019-10-16 ENCOUNTER — Telehealth: Payer: Self-pay | Admitting: Family Medicine

## 2019-10-16 DIAGNOSIS — Z87440 Personal history of urinary (tract) infections: Secondary | ICD-10-CM

## 2019-10-16 MED ORDER — CEPHALEXIN 250 MG/5ML PO SUSR: 125.0000 mg | Freq: Every day | ORAL | 1 refills | Status: DC

## 2019-10-16 NOTE — Telephone Encounter (Signed)
Pt care giver called and stated that her medication should be good until noverber it is hard for pt to get in and out of office she is having surgery and she just wants to make sure pt will still be able to get refills if needing in November. Pt sch toc with Dr. Carlota Raspberry in January. Please advise.

## 2019-10-17 ENCOUNTER — Encounter (HOSPITAL_BASED_OUTPATIENT_CLINIC_OR_DEPARTMENT_OTHER)
Admission: RE | Disposition: A | Discharge status: Home / Self Care | Payer: Self-pay | Source: Home / Self Care | Attending: Plastic Surgery

## 2019-10-17 ENCOUNTER — Ambulatory Visit (HOSPITAL_BASED_OUTPATIENT_CLINIC_OR_DEPARTMENT_OTHER)
Admission: RE | Admit: 2019-10-17 | Discharge: 2019-10-17 | Disposition: A | Discharge status: Home / Self Care | Payer: Medicare HMO | Attending: Plastic Surgery | Admitting: Plastic Surgery

## 2019-10-17 ENCOUNTER — Ambulatory Visit (HOSPITAL_BASED_OUTPATIENT_CLINIC_OR_DEPARTMENT_OTHER): Payer: Medicare HMO | Admitting: Certified Registered Nurse Anesthetist

## 2019-10-17 ENCOUNTER — Encounter (HOSPITAL_BASED_OUTPATIENT_CLINIC_OR_DEPARTMENT_OTHER): Payer: Self-pay | Admitting: Plastic Surgery

## 2019-10-17 ENCOUNTER — Other Ambulatory Visit: Payer: Self-pay

## 2019-10-17 ENCOUNTER — Telehealth: Payer: Self-pay | Admitting: *Deleted

## 2019-10-17 DIAGNOSIS — Z87440 Personal history of urinary (tract) infections: Secondary | ICD-10-CM

## 2019-10-17 DIAGNOSIS — E039 Hypothyroidism, unspecified: Secondary | ICD-10-CM | POA: Insufficient documentation

## 2019-10-17 DIAGNOSIS — F419 Anxiety disorder, unspecified: Secondary | ICD-10-CM | POA: Insufficient documentation

## 2019-10-17 DIAGNOSIS — Z79899 Other long term (current) drug therapy: Secondary | ICD-10-CM | POA: Insufficient documentation

## 2019-10-17 DIAGNOSIS — I10 Essential (primary) hypertension: Secondary | ICD-10-CM | POA: Insufficient documentation

## 2019-10-17 DIAGNOSIS — W228XXA Striking against or struck by other objects, initial encounter: Secondary | ICD-10-CM | POA: Insufficient documentation

## 2019-10-17 DIAGNOSIS — Z79890 Hormone replacement therapy (postmenopausal): Secondary | ICD-10-CM | POA: Diagnosis not present

## 2019-10-17 DIAGNOSIS — E785 Hyperlipidemia, unspecified: Secondary | ICD-10-CM | POA: Diagnosis not present

## 2019-10-17 DIAGNOSIS — K219 Gastro-esophageal reflux disease without esophagitis: Secondary | ICD-10-CM | POA: Insufficient documentation

## 2019-10-17 DIAGNOSIS — M1990 Unspecified osteoarthritis, unspecified site: Secondary | ICD-10-CM | POA: Insufficient documentation

## 2019-10-17 DIAGNOSIS — F329 Major depressive disorder, single episode, unspecified: Secondary | ICD-10-CM | POA: Insufficient documentation

## 2019-10-17 DIAGNOSIS — S81802A Unspecified open wound, left lower leg, initial encounter: Secondary | ICD-10-CM | POA: Diagnosis not present

## 2019-10-17 DIAGNOSIS — Z85820 Personal history of malignant melanoma of skin: Secondary | ICD-10-CM | POA: Diagnosis not present

## 2019-10-17 HISTORY — PX: I & D EXTREMITY: SHX5045

## 2019-10-17 HISTORY — DX: Gastro-esophageal reflux disease without esophagitis: K21.9

## 2019-10-17 HISTORY — DX: Anxiety disorder, unspecified: F41.9

## 2019-10-17 HISTORY — PX: APPLICATION OF A-CELL OF EXTREMITY: SHX6303

## 2019-10-17 HISTORY — DX: Depression, unspecified: F32.A

## 2019-10-17 LAB — BASIC METABOLIC PANEL
Anion gap: 11 (ref 5–15)
BUN: 12 mg/dL (ref 8–23)
CO2: 28 mmol/L (ref 22–32)
Calcium: 8.6 mg/dL — ABNORMAL LOW (ref 8.9–10.3)
Chloride: 91 mmol/L — ABNORMAL LOW (ref 98–111)
Creatinine, Ser: 0.66 mg/dL (ref 0.44–1.00)
GFR calc Af Amer: >60 mL/min (ref >60)
GFR calc non Af Amer: >60 mL/min (ref >60)
Glucose, Bld: 99 mg/dL (ref 70–99)
Potassium: 3.6 mmol/L (ref 3.5–5.1)
Sodium: 130 mmol/L — ABNORMAL LOW (ref 135–145)

## 2019-10-17 SURGERY — IRRIGATION AND DEBRIDEMENT EXTREMITY
Anesthesia: General | Site: Leg Lower | Laterality: Left

## 2019-10-17 MED ORDER — FENTANYL CITRATE (PF) 100 MCG/2ML IJ SOLN: 25.0000 ug | INTRAMUSCULAR | Status: DC | PRN

## 2019-10-17 MED ORDER — BUPIVACAINE HCL (PF) 0.25 % IJ SOLN
INTRAMUSCULAR | Status: DC | PRN
  Administered 2019-10-17: 10 mL

## 2019-10-17 MED ORDER — PROMETHAZINE HCL 25 MG/ML IJ SOLN: 6.2500 mg | INTRAMUSCULAR | Status: DC | PRN

## 2019-10-17 MED ORDER — FENTANYL CITRATE (PF) 100 MCG/2ML IJ SOLN: 12.5000 ug | INTRAMUSCULAR | Status: DC | PRN

## 2019-10-17 MED ORDER — SODIUM CHLORIDE 0.9 % IV SOLN: INTRAVENOUS | Status: DC | PRN

## 2019-10-17 MED ORDER — PROPOFOL 500 MG/50ML IV EMUL
INTRAVENOUS | Status: AC
  Filled 2019-10-17: qty 50

## 2019-10-17 MED ORDER — SODIUM CHLORIDE 0.9 % IV SOLN: 250.0000 mL | INTRAVENOUS | Status: DC | PRN

## 2019-10-17 MED ORDER — ONDANSETRON HCL 4 MG/2ML IJ SOLN
INTRAMUSCULAR | Status: AC
  Filled 2019-10-17: qty 2

## 2019-10-17 MED ORDER — SODIUM CHLORIDE 0.9% FLUSH: 3.0000 mL | Freq: Two times a day (BID) | INTRAVENOUS | Status: DC

## 2019-10-17 MED ORDER — ACETAMINOPHEN 325 MG PO TABS: 650.0000 mg | ORAL_TABLET | ORAL | Status: DC | PRN

## 2019-10-17 MED ORDER — CEFAZOLIN SODIUM-DEXTROSE 2-4 GM/100ML-% IV SOLN
INTRAVENOUS | Status: AC
  Filled 2019-10-17: qty 100

## 2019-10-17 MED ORDER — SODIUM CHLORIDE 0.9% FLUSH: 3.0000 mL | INTRAVENOUS | Status: DC | PRN

## 2019-10-17 MED ORDER — CEFAZOLIN SODIUM-DEXTROSE 2-4 GM/100ML-% IV SOLN
2.0000 g | INTRAVENOUS | Status: AC
  Administered 2019-10-17: 2 g via INTRAVENOUS

## 2019-10-17 MED ORDER — FENTANYL CITRATE (PF) 100 MCG/2ML IJ SOLN
INTRAMUSCULAR | Status: AC
  Filled 2019-10-17: qty 2

## 2019-10-17 MED ORDER — PROPOFOL 10 MG/ML IV BOLUS
INTRAVENOUS | Status: DC | PRN
  Administered 2019-10-17: 60 mg via INTRAVENOUS

## 2019-10-17 MED ORDER — LIDOCAINE-EPINEPHRINE 1 %-1:100000 IJ SOLN
INTRAMUSCULAR | Status: AC
  Filled 2019-10-17: qty 2

## 2019-10-17 MED ORDER — ONDANSETRON HCL 4 MG/2ML IJ SOLN
INTRAMUSCULAR | Status: DC | PRN
  Administered 2019-10-17: 4 mg via INTRAVENOUS

## 2019-10-17 MED ORDER — DEXAMETHASONE SODIUM PHOSPHATE 10 MG/ML IJ SOLN
INTRAMUSCULAR | Status: AC
  Filled 2019-10-17: qty 1

## 2019-10-17 MED ORDER — LIDOCAINE 2% (20 MG/ML) 5 ML SYRINGE
INTRAMUSCULAR | Status: DC | PRN
  Administered 2019-10-17: 40 mg via INTRAVENOUS

## 2019-10-17 MED ORDER — DEXAMETHASONE SODIUM PHOSPHATE 10 MG/ML IJ SOLN
INTRAMUSCULAR | Status: DC | PRN
  Administered 2019-10-17: 5 mg via INTRAVENOUS

## 2019-10-17 MED ORDER — BUPIVACAINE HCL (PF) 0.25 % IJ SOLN
INTRAMUSCULAR | Status: AC
  Filled 2019-10-17: qty 90

## 2019-10-17 MED ORDER — LIDOCAINE 2% (20 MG/ML) 5 ML SYRINGE
INTRAMUSCULAR | Status: AC
  Filled 2019-10-17: qty 5

## 2019-10-17 MED ORDER — OXYMETAZOLINE HCL 0.05 % NA SOLN
NASAL | Status: AC
  Filled 2019-10-17: qty 30

## 2019-10-17 MED ORDER — LACTATED RINGERS IV SOLN: INTRAVENOUS | Status: DC

## 2019-10-17 MED ORDER — BACITRACIN-NEOMYCIN-POLYMYXIN OINTMENT TUBE
TOPICAL_OINTMENT | CUTANEOUS | Status: AC
  Filled 2019-10-17: qty 28.34

## 2019-10-17 MED ORDER — FENTANYL CITRATE (PF) 100 MCG/2ML IJ SOLN
INTRAMUSCULAR | Status: DC | PRN
Start: 2019-10-17 — End: 2019-10-17
  Administered 2019-10-17: 12.5 ug via INTRAVENOUS

## 2019-10-17 MED ORDER — VANCOMYCIN HCL 1000 MG IV SOLR
INTRAVENOUS | Status: AC
  Filled 2019-10-17: qty 1000

## 2019-10-17 MED ORDER — LIDOCAINE-EPINEPHRINE 1 %-1:100000 IJ SOLN
INTRAMUSCULAR | Status: DC | PRN
  Administered 2019-10-17: 10 mL

## 2019-10-17 MED ORDER — SODIUM CHLORIDE (PF) 0.9 % IJ SOLN
INTRAMUSCULAR | Status: AC
  Filled 2019-10-17: qty 10

## 2019-10-17 MED ORDER — ACETAMINOPHEN 325 MG RE SUPP: 650.0000 mg | RECTAL | Status: DC | PRN

## 2019-10-17 MED ORDER — AMISULPRIDE (ANTIEMETIC) 5 MG/2ML IV SOLN: 10.0000 mg | Freq: Once | INTRAVENOUS | Status: DC | PRN

## 2019-10-17 MED ORDER — CHLORHEXIDINE GLUCONATE CLOTH 2 % EX PADS: 6.0000 | MEDICATED_PAD | Freq: Once | CUTANEOUS | Status: DC

## 2019-10-17 SURGICAL SUPPLY — 76 items
ADH SKN CLS APL DERMABOND .7 (GAUZE/BANDAGES/DRESSINGS)
BAG DECANTER FOR FLEXI CONT (MISCELLANEOUS) IMPLANT
BLADE HEX COATED 2.75 (ELECTRODE) IMPLANT
BLADE SURG 10 STRL SS (BLADE) IMPLANT
BLADE SURG 15 STRL LF DISP TIS (BLADE) ×1 IMPLANT
BLADE SURG 15 STRL SS (BLADE) ×3
BNDG COHESIVE 4X5 TAN STRL (GAUZE/BANDAGES/DRESSINGS) ×3 IMPLANT
BNDG CONFORM 2 STRL LF (GAUZE/BANDAGES/DRESSINGS) IMPLANT
BNDG CONFORM 3 STRL LF (GAUZE/BANDAGES/DRESSINGS) IMPLANT
BNDG ELASTIC 3X5.8 VLCR STR LF (GAUZE/BANDAGES/DRESSINGS) IMPLANT
BNDG ELASTIC 4X5.8 VLCR STR LF (GAUZE/BANDAGES/DRESSINGS) ×3 IMPLANT
BNDG ELASTIC 6X5.8 VLCR STR LF (GAUZE/BANDAGES/DRESSINGS) IMPLANT
BNDG GAUZE ELAST 4 BULKY (GAUZE/BANDAGES/DRESSINGS) ×3 IMPLANT
CANISTER SUCT 1200ML W/VALVE (MISCELLANEOUS) IMPLANT
CLOSURE WOUND 1/2 X4 (GAUZE/BANDAGES/DRESSINGS)
COVER BACK TABLE 60X90IN (DRAPES) ×3 IMPLANT
COVER WAND RF STERILE (DRAPES) IMPLANT
DECANTER SPIKE VIAL GLASS SM (MISCELLANEOUS) IMPLANT
DERMABOND ADVANCED (GAUZE/BANDAGES/DRESSINGS)
DERMABOND ADVANCED .7 DNX12 (GAUZE/BANDAGES/DRESSINGS) IMPLANT
DRAPE INCISE IOBAN 66X45 STRL (DRAPES) IMPLANT
DRAPE U-SHAPE 76X120 STRL (DRAPES) ×3 IMPLANT
DRSG ADAPTIC 3X8 NADH LF (GAUZE/BANDAGES/DRESSINGS) IMPLANT
DRSG CUTIMED SORBACT 7X9 (GAUZE/BANDAGES/DRESSINGS) ×3 IMPLANT
DRSG EMULSION OIL 3X3 NADH (GAUZE/BANDAGES/DRESSINGS) IMPLANT
DRSG HYDROCOLLOID 4X4 (GAUZE/BANDAGES/DRESSINGS) ×3 IMPLANT
DRSG PAD ABDOMINAL 8X10 ST (GAUZE/BANDAGES/DRESSINGS) ×3 IMPLANT
ELECT REM PT RETURN 9FT ADLT (ELECTROSURGICAL) ×3
ELECTRODE REM PT RTRN 9FT ADLT (ELECTROSURGICAL) ×1 IMPLANT
GAUZE SPONGE 4X4 12PLY STRL (GAUZE/BANDAGES/DRESSINGS) ×3 IMPLANT
GAUZE SPONGE 4X4 12PLY STRL LF (GAUZE/BANDAGES/DRESSINGS) IMPLANT
GAUZE XEROFORM 1X8 LF (GAUZE/BANDAGES/DRESSINGS) IMPLANT
GAUZE XEROFORM 5X9 LF (GAUZE/BANDAGES/DRESSINGS) IMPLANT
GLOVE BIO SURGEON STRL SZ 6.5 (GLOVE) ×4 IMPLANT
GLOVE BIO SURGEON STRL SZ7.5 (GLOVE) ×3 IMPLANT
GLOVE BIO SURGEON STRL SZ8 (GLOVE) IMPLANT
GLOVE BIO SURGEONS STRL SZ 6.5 (GLOVE) ×2
GLOVE BIOGEL M 6.5 STRL (GLOVE) ×3 IMPLANT
GOWN STRL REUS W/ TWL LRG LVL3 (GOWN DISPOSABLE) ×3 IMPLANT
GOWN STRL REUS W/TWL LRG LVL3 (GOWN DISPOSABLE) ×9
MANIFOLD NEPTUNE II (INSTRUMENTS) IMPLANT
MATRIX WOUND FLOWABLE 3CC (Tissue) ×3 IMPLANT
MATRIX WOUND MESHED 2X2 (Tissue) ×1 IMPLANT
NEEDLE HYPO 25X1 1.5 SAFETY (NEEDLE) ×3 IMPLANT
NS IRRIG 1000ML POUR BTL (IV SOLUTION) ×3 IMPLANT
PACK BASIN DAY SURGERY FS (CUSTOM PROCEDURE TRAY) ×3 IMPLANT
PADDING CAST ABS 3INX4YD NS (CAST SUPPLIES)
PADDING CAST ABS 4INX4YD NS (CAST SUPPLIES)
PADDING CAST ABS COTTON 3X4 (CAST SUPPLIES) IMPLANT
PADDING CAST ABS COTTON 4X4 ST (CAST SUPPLIES) IMPLANT
PENCIL SMOKE EVACUATOR (MISCELLANEOUS) ×3 IMPLANT
SHEET MEDIUM DRAPE 40X70 STRL (DRAPES) ×3 IMPLANT
SLEEVE SCD COMPRESS KNEE MED (MISCELLANEOUS) ×3 IMPLANT
SPLINT FIBERGLASS 3X35 (CAST SUPPLIES) ×3 IMPLANT
SPLINT FIBERGLASS 4X30 (CAST SUPPLIES) IMPLANT
SPLINT PLASTER CAST XFAST 3X15 (CAST SUPPLIES) IMPLANT
SPLINT PLASTER XTRA FASTSET 3X (CAST SUPPLIES)
SPONGE LAP 18X18 RF (DISPOSABLE) ×6 IMPLANT
STAPLER VISISTAT 35W (STAPLE) IMPLANT
STOCKINETTE IMPERVIOUS LG (DRAPES) IMPLANT
STRIP CLOSURE SKIN 1/2X4 (GAUZE/BANDAGES/DRESSINGS) IMPLANT
SURGILUBE 2OZ TUBE FLIPTOP (MISCELLANEOUS) ×3 IMPLANT
SUT SILK 3 0 PS 1 (SUTURE) IMPLANT
SUT SILK 4 0 PS 2 (SUTURE) IMPLANT
SUT VIC AB 5-0 PS2 18 (SUTURE) ×9 IMPLANT
SYR BULB IRRIG 60ML STRL (SYRINGE) ×3 IMPLANT
SYR CONTROL 10ML LL (SYRINGE) ×3 IMPLANT
TAPE HYPAFIX 6 X30' (GAUZE/BANDAGES/DRESSINGS)
TAPE HYPAFIX 6X30 (GAUZE/BANDAGES/DRESSINGS) IMPLANT
TOWEL GREEN STERILE FF (TOWEL DISPOSABLE) ×3 IMPLANT
TRAY DSU PREP LF (CUSTOM PROCEDURE TRAY) ×3 IMPLANT
TUBE CONNECTING 20'X1/4 (TUBING) ×1
TUBE CONNECTING 20X1/4 (TUBING) ×2 IMPLANT
UNDERPAD 30X36 HEAVY ABSORB (UNDERPADS AND DIAPERS) ×3 IMPLANT
WOUND MATRIX MESHED 2X2 (Tissue) ×2 IMPLANT
YANKAUER SUCT BULB TIP NO VENT (SUCTIONS) ×3 IMPLANT

## 2019-10-17 NOTE — Telephone Encounter (Signed)
[facility]  Patient is to take keflex daily for uti prophylaxis.  Thanks

## 2019-10-17 NOTE — Interval H&P Note (Signed)
History and Physical Interval Note:  10/17/2019 8:44 AM  [name]  has presented today for surgery, with the diagnosis of Wound of left lower extremity.  The various methods of treatment have been discussed with the patient and family. After consideration of risks, benefits and other options for treatment, the patient has consented to  Procedure(s) with comments: IRRIGATION AND DEBRIDEMENT EXTREMITY (Left) - 45 min APPLICATION OF INTEGRA BILAYER WOUND MATRIX OF EXTREMITY (Left) as a surgical intervention.  The patient's history has been reviewed, patient examined, no change in status, stable for surgery.  I have reviewed the patient's chart and labs.  Questions were answered to the patient's satisfaction.     Kayla Lloyd

## 2019-10-17 NOTE — Anesthesia Preprocedure Evaluation (Addendum)
Anesthesia Evaluation  Patient identified by MRN, date of birth, ID band Patient awake    Reviewed: Allergy & Precautions, NPO status , Patient's Chart, lab work & pertinent test results  Airway Mallampati: II  TM Distance: >3 FB Neck ROM: Full    Dental  (+) Edentulous Upper, Edentulous Lower   Pulmonary neg pulmonary ROS,    Pulmonary exam normal breath sounds clear to auscultation       Cardiovascular hypertension, Pt. on medications negative cardio ROS Normal cardiovascular exam Rhythm:Regular Rate:Normal     Neuro/Psych Anxiety Depression negative neurological ROS  negative psych ROS   GI/Hepatic Neg liver ROS, GERD  ,  Endo/Other  Hypothyroidism   Renal/GU negative Renal ROS  negative genitourinary   Musculoskeletal  (+) Arthritis , Osteoarthritis,    Abdominal   Peds negative pediatric ROS (+)  Hematology negative hematology ROS (+)   Anesthesia Other Findings   Reproductive/Obstetrics negative OB ROS                            Anesthesia Physical Anesthesia Plan  ASA: III  Anesthesia Plan: General   Post-op Pain Management:    Induction: Intravenous  PONV Risk Score and Plan: 3 and Ondansetron, Dexamethasone, Midazolam and Treatment may vary due to age or medical condition  Airway Management Planned: LMA  Additional Equipment:   Intra-op Plan:   Post-operative Plan: Extubation in OR  Informed Consent: I have reviewed the patients History and Physical, chart, labs and discussed the procedure including the risks, benefits and alternatives for the proposed anesthesia with the patient or authorized representative who has indicated his/her understanding and acceptance.     Dental advisory given  Plan Discussed with: CRNA  Anesthesia Plan Comments:         Anesthesia Quick Evaluation

## 2019-10-17 NOTE — Transfer of Care (Signed)
Immediate Anesthesia Transfer of Care Note  Patient: Kayla Lloyd  Procedure(s) Performed: IRRIGATION AND DEBRIDEMENT EXTREMITY (Left Leg Lower) APPLICATION OF INTEGRA BILAYER WOUND MATRIX OF EXTREMITY (Left Leg Lower)  Patient Location: PACU  Anesthesia Type:General  Level of Consciousness: awake, alert  and oriented  Airway & Oxygen Therapy: Patient Spontanous Breathing and Patient connected to face mask oxygen  Post-op Assessment: Report given to RN and Post -op Vital signs reviewed and stable  Post vital signs: Reviewed and stable  Last Vitals:  Vitals Value Taken Time  BP 135/65 10/17/19 1006  Temp    Pulse 81 10/17/19 1010  Resp 20 10/17/19 1010  SpO2 100 % 10/17/19 1010  Vitals shown include unvalidated device data.  Last Pain:  Vitals:   10/17/19 1005  TempSrc:   PainSc: (P) 0-No pain         Complications: No complications documented.

## 2019-10-17 NOTE — Discharge Instructions (Signed)
Please keep leg elevated as able.  Do not change the wrap until appointment with Dr. Marla Roe in the office.  Post Anesthesia Home Care Instructions  Activity: Get plenty of rest for the remainder of the day. A responsible individual must stay with you for 24 hours following the procedure.  For the next 24 hours, DO NOT: -Drive a car -Paediatric nurse -Drink alcoholic beverages -Take any medication unless instructed by your physician -Make any legal decisions or sign important papers.  Meals: Start with liquid foods such as gelatin or soup. Progress to regular foods as tolerated. Avoid greasy, spicy, heavy foods. If nausea and/or vomiting occur, drink only clear liquids until the nausea and/or vomiting subsides. Call your physician if vomiting continues.  Special Instructions/Symptoms: Your throat may feel dry or sore from the anesthesia or the breathing tube placed in your throat during surgery. If this causes discomfort, gargle with warm salt water. The discomfort should disappear within 24 hours.  If you had a scopolamine patch placed behind your ear for the management of post- operative nausea and/or vomiting:  1. The medication in the patch is effective for 72 hours, after which it should be removed.  Wrap patch in a tissue and discard in the trash. Wash hands thoroughly with soap and water. 2. You may remove the patch earlier than 72 hours if you experience unpleasant side effects which may include dry mouth, dizziness or visual disturbances. 3. Avoid touching the patch. Wash your hands with soap and water after contact with the patch.

## 2019-10-17 NOTE — Telephone Encounter (Signed)
Good morning Dr Arlyss Gandy from the [facility] [phone]) called to ask if you could change Cephalexin sent yesterday to 125 mg/5 ml/100 ml. The reason because the original Rx is good for 14 days with the additional supply not used. Thank you.

## 2019-10-17 NOTE — Anesthesia Procedure Notes (Signed)
Procedure Name: LMA Insertion Date/Time: 10/17/2019 9:38 AM Performed by: Genelle Bal, CRNA Pre-anesthesia Checklist: Patient identified, Emergency Drugs available, Suction available and Patient being monitored Patient Re-evaluated:Patient Re-evaluated prior to induction Oxygen Delivery Method: Circle system utilized Preoxygenation: Pre-oxygenation with 100% oxygen Induction Type: IV induction Ventilation: Mask ventilation without difficulty LMA: LMA inserted LMA Size: 3.0 Number of attempts: 1 Airway Equipment and Method: Bite block Placement Confirmation: positive ETCO2 Tube secured with: Tape Dental Injury: Teeth and Oropharynx as per pre-operative assessment

## 2019-10-17 NOTE — Telephone Encounter (Signed)
Message sent thru MyChart 

## 2019-10-17 NOTE — Telephone Encounter (Signed)
Dr. Pamella Pert,  Rives with Barnett Applebaum from the pharmacy regarding the dosage amt. So, just to make sure I am understanding your response correctly, are you saying that the medication dosage can be dropped to the to 125 mg/5 ml/100 ml and this is so that none will be wasted.  Thank you,  Kayla Lloyd

## 2019-10-17 NOTE — Op Note (Signed)
DATE OF OPERATION: 10/17/2019  LOCATION: [place]  PREOPERATIVE DIAGNOSIS: Left leg wound from trauma  POSTOPERATIVE DIAGNOSIS: Same  PROCEDURE: Excision of left leg wound (skin, fat and soft tissue) from trauma and hematoma 5 x 7 cm.  Integra 5 x 5 cm sheet and flowable was applied.  SURGEON: Marley Pakula Sanger Currie Dennin, DO  ASSISTANT: Roetta Sessions, PA  EBL: 2 cc  CONDITION: Stable  COMPLICATIONS: None  INDICATION: The patient, Kayla Lloyd, is a 86 y.o. female born on 1923/07/18, is here for treatment of a left leg wound after trauma.  She was on Goody powder which compounded the hematoma.  There was concern about infection.  PROCEDURE DETAILS:  The patient was seen prior to surgery and marked.  The IV antibiotics were given. The patient was taken to the operating room and given a general anesthetic. A standard time out was performed and all information was confirmed by those in the room. SCD was placed on the right leg.  Local was injected at the periwound area.  A #10 blade was used to excise the 5 x 7 cm wound which included skin, soft tissue and fat.  Hemostasis was achieved with electrocautery.  The flowable Integra was applied to the entire wound. The comparable size Integra sheet was applied and secured with 5-0 Vicryl.  An Adaptic was placed and secured.  K-Y jelly was applied with 4 x 4 gauze and an ABD.  The leg was wrapped with Kerlix and an Ace wrap.   The patient was allowed to wake up and taken to recovery room in stable condition at the end of the case. The family was notified at the end of the case.   The advanced practice practitioner (APP) assisted throughout the case.  The APP was essential in retraction and counter traction when needed to make the case progress smoothly.  This retraction and assistance made it possible to see the tissue plans for the procedure.  The assistance was needed for blood control, tissue re-approximation and assisted  with closure of the incision site.

## 2019-10-17 NOTE — Anesthesia Postprocedure Evaluation (Signed)
Anesthesia Post Note  Patient: Kayla Lloyd  Procedure(s) Performed: IRRIGATION AND DEBRIDEMENT EXTREMITY (Left Leg Lower) APPLICATION OF INTEGRA BILAYER WOUND MATRIX OF EXTREMITY (Left Leg Lower)     Patient location during evaluation: PACU Anesthesia Type: General Level of consciousness: awake and alert Pain management: pain level controlled Vital Signs Assessment: post-procedure vital signs reviewed and stable Respiratory status: spontaneous breathing, nonlabored ventilation and respiratory function stable Cardiovascular status: blood pressure returned to baseline and stable Postop Assessment: no apparent nausea or vomiting Anesthetic complications: no   No complications documented.  Last Vitals:  Vitals:   10/17/19 1045 10/17/19 1114  BP: (!) 128/56 (!) 143/70  Pulse: 79 85  Resp: 20 18  Temp:    SpO2: 96% 97%    Last Pain:  Vitals:   10/17/19 1030  TempSrc:   PainSc: 0-No pain                 Lynda Rainwater

## 2019-10-18 ENCOUNTER — Encounter (HOSPITAL_BASED_OUTPATIENT_CLINIC_OR_DEPARTMENT_OTHER): Payer: Medicare HMO | Admitting: Internal Medicine

## 2019-10-18 MED ORDER — CEPHALEXIN 125 MG/5ML PO SUSR: 125.0000 mg | Freq: Every day | ORAL | 11 refills | Status: DC

## 2019-10-18 NOTE — Addendum Note (Signed)
Addended by: Rutherford Guys on: 10/18/2019 09:07 AM   Modules accepted: Orders

## 2019-10-18 NOTE — Telephone Encounter (Signed)
Spoke with pharmacist. Rx changed to 100 ml as suspension is only good for 14 days. Patient will be taking daily for UTI prophylaxis

## 2019-10-19 ENCOUNTER — Encounter: Payer: Medicare HMO | Admitting: Family Medicine

## 2019-10-19 ENCOUNTER — Encounter (HOSPITAL_BASED_OUTPATIENT_CLINIC_OR_DEPARTMENT_OTHER): Payer: Self-pay | Admitting: Plastic Surgery

## 2019-10-25 ENCOUNTER — Other Ambulatory Visit: Payer: Self-pay

## 2019-10-25 DIAGNOSIS — I872 Venous insufficiency (chronic) (peripheral): Secondary | ICD-10-CM

## 2019-10-25 NOTE — Progress Notes (Signed)
   Subjective:     Patient ID: Kayla Lloyd, female    DOB: 03-Aug-1923, 86 y.o.   MRN: [id number]  Chief Complaint  Patient presents with  . Post-op Follow-up    HPI: The patient is a 86 y.o. female here for follow-up after undergoing excision of left leg wound from trauma and hematoma (5 x 7 cm) with placement of Integra sheet and flowable on 10/17/2019 with Dr. Marla Roe.  Patient presents today with her grandaughter. Denies F/C, N/V.  Reports she is doing well. Pain well controlled.Sorbact is in place. Integra sheet has not fully incorporated, visible when lifting edges of sorbact.   Review of Systems  Constitutional: Negative for chills and fever.  Respiratory: Negative for shortness of breath.   Cardiovascular: Negative for chest pain.  Gastrointestinal: Negative for constipation, diarrhea, nausea and vomiting.     Objective:   Vital Signs BP 121/86 (BP Location: Left Arm, Patient Position: Sitting, Cuff Size: Normal)   Pulse 76   Temp 98.2 F (36.8 C) (Oral)   SpO2 98%  Vital Signs and Nursing Note Reviewed  Physical Exam Constitutional:      General: She is not in acute distress.    Appearance: Normal appearance. She is not ill-appearing.  HENT:     Head: Normocephalic and atraumatic.  Eyes:     Extraocular Movements: Extraocular movements intact.  Cardiovascular:     Rate and Rhythm: Normal rate.  Pulmonary:     Effort: Pulmonary effort is normal.  Musculoskeletal:        General: Normal range of motion.     Cervical back: Normal range of motion.     Comments: Left leg wound has sorbact in place. Integra sheet has not fully incorporated, visible when lifting edges of sorbact. Mild irritation erythema present. Mild swelling superior to area wrapped. No signs of infection.  Skin:    General: Skin is warm and dry.     Coloration: Skin is not pale.  Neurological:     Mental Status: She is alert and oriented to person, place, and time.     Gait: Gait  is intact.  Psychiatric:        Mood and Affect: Mood and affect normal.        Cognition and Memory: Memory normal.        Judgment: Judgment normal.       Assessment/Plan:     ICD-10-CM   1. Wound of left lower extremity, initial encounter  S81.802A     Patient is doing well. Integra needs additional time to incorporate. No signs of infection. Do dressing changes every other day of K-Y jelly applied to Sorbact (green mesh) and covered by 4 x 4 gauze and ABD, wrapped in Kerlix and Ace wrap. Elevate leg when possible to assist with swelling.  Follow up in 1-2 weeks. Call office with any questions/concerns.  The [place] was signed into law in 2016 which includes the topic of electronic health records.  This provides immediate access to information in MyChart.  This includes consultation notes, operative notes, office notes, lab results and pathology reports.  If you have any questions about what you read please let us know at your next visit or call us at the office.  We are right here with you.   Threasa Heads, PA-C 10/27/2019, 4:26 PM

## 2019-10-26 ENCOUNTER — Encounter: Payer: Self-pay | Admitting: Plastic Surgery

## 2019-10-26 ENCOUNTER — Ambulatory Visit (INDEPENDENT_AMBULATORY_CARE_PROVIDER_SITE_OTHER): Payer: Medicare HMO | Admitting: Plastic Surgery

## 2019-10-26 ENCOUNTER — Other Ambulatory Visit: Payer: Self-pay

## 2019-10-26 VITALS — BP 121/86 | HR 76 | Temp 98.2°F

## 2019-10-26 DIAGNOSIS — S81802A Unspecified open wound, left lower leg, initial encounter: Secondary | ICD-10-CM

## 2019-10-27 ENCOUNTER — Encounter: Payer: Self-pay | Admitting: Plastic Surgery

## 2019-10-28 ENCOUNTER — Encounter: Payer: Self-pay | Admitting: Family Medicine

## 2019-11-02 ENCOUNTER — Other Ambulatory Visit: Payer: Self-pay | Admitting: Family Medicine

## 2019-11-02 NOTE — Telephone Encounter (Signed)
Requested medication (s) are due for refill today: yes  Requested medication (s) are on the active medication list: yes  Last refill:  10/18/19  Future visit scheduled: yes  Notes to clinic:  no assigned protocol    Requested Prescriptions  Pending Prescriptions Disp Refills   cephALEXin (KEFLEX) 125 MG/5ML suspension [Pharmacy Med Name: cephalexin 125 mg/5 mL oral suspension] 100 mL 1    Sig: TAKE 5 ML (125 MG) BY MOUTH daily FOR 14 DAYS      Off-Protocol Failed - 11/02/2019  9:00 AM      Failed - Medication not assigned to a protocol, review manually.      Passed - Valid encounter within last 12 months    Recent Outpatient Visits           1 month ago Cellulitis of left anterior lower leg   [facility], Lilia Argue, MD   1 month ago Traumatic hematoma   [facility], Lilia Argue, MD   1 month ago Dysuria   [facility], Delfino Lovett, NP   2 months ago Dysuria   [facility], Ranell Patrick, MD   2 months ago Abnormal liver function test   [facility], Lilia Argue, MD       Future Appointments             In 1 month Carlota Raspberry Ranell Patrick, MD [facility]

## 2019-11-05 ENCOUNTER — Ambulatory Visit: Payer: Medicare HMO | Admitting: Podiatry

## 2019-11-06 ENCOUNTER — Other Ambulatory Visit: Payer: Self-pay

## 2019-11-06 ENCOUNTER — Ambulatory Visit (INDEPENDENT_AMBULATORY_CARE_PROVIDER_SITE_OTHER): Payer: Medicare HMO | Admitting: Plastic Surgery

## 2019-11-06 ENCOUNTER — Encounter: Payer: Self-pay | Admitting: Plastic Surgery

## 2019-11-06 VITALS — BP 107/69 | HR 81 | Temp 98.5°F

## 2019-11-06 DIAGNOSIS — S81802A Unspecified open wound, left lower leg, initial encounter: Secondary | ICD-10-CM

## 2019-11-06 NOTE — Progress Notes (Signed)
   Subjective:    Patient ID: Kayla Lloyd, female    DOB: [birth year], 86 y.o.   MRN: [id number]  The patient is a 86 year old female here with her daughter for follow-up on her left leg wound.  She had a hematoma that was debrided and cleaned in the operating room.  She is doing much better.  Her leg is less swollen and less red than prior to the debridement.  The sorbact is on and her daughter has been doing dressing changes with K-Y jelly daily.     Review of Systems  Constitutional: Negative for activity change and appetite change.  Eyes: Negative.   Respiratory: Negative.  Negative for chest tightness.   Cardiovascular: Positive for leg swelling.  Gastrointestinal: Negative for abdominal pain.  Genitourinary: Negative.        Objective:   Physical Exam Vitals and nursing note reviewed.  HENT:     Head: Normocephalic and atraumatic.  Cardiovascular:     Rate and Rhythm: Normal rate.     Pulses: Normal pulses.  Neurological:     General: No focal deficit present.     Mental Status: She is alert. Mental status is at baseline.  Psychiatric:        Mood and Affect: Mood normal.        Behavior: Behavior normal.        Assessment & Plan:     ICD-10-CM   1. Wound of left lower extremity, initial encounter  S81.802A     Continue with K-Y jelly dressing changes.  I will plan to remove the sorbact next week.

## 2019-11-12 DIAGNOSIS — S81802A Unspecified open wound, left lower leg, initial encounter: Secondary | ICD-10-CM | POA: Diagnosis not present

## 2019-11-14 ENCOUNTER — Other Ambulatory Visit: Payer: Self-pay

## 2019-11-14 ENCOUNTER — Ambulatory Visit (HOSPITAL_COMMUNITY)
Admission: RE | Admit: 2019-11-14 | Discharge: 2019-11-14 | Disposition: A | Discharge status: Home / Self Care | Payer: Medicare HMO | Source: Ambulatory Visit | Attending: Vascular Surgery | Admitting: Vascular Surgery

## 2019-11-14 ENCOUNTER — Ambulatory Visit (INDEPENDENT_AMBULATORY_CARE_PROVIDER_SITE_OTHER): Payer: Medicare HMO | Admitting: Vascular Surgery

## 2019-11-14 ENCOUNTER — Encounter: Payer: Self-pay | Admitting: Vascular Surgery

## 2019-11-14 VITALS — BP 101/59 | HR 82 | Temp 97.2°F | Resp 14 | Ht 59.0 in | Wt 111.0 lb

## 2019-11-14 DIAGNOSIS — I872 Venous insufficiency (chronic) (peripheral): Secondary | ICD-10-CM | POA: Insufficient documentation

## 2019-11-14 DIAGNOSIS — I739 Peripheral vascular disease, unspecified: Secondary | ICD-10-CM | POA: Diagnosis not present

## 2019-11-14 NOTE — Progress Notes (Signed)
Patient name: Kayla Lloyd MRN: [id number] DOB: 01-04-1924 Sex: female  HPI: Kayla Lloyd is a 86 y.o. female, with a several month history of nonhealing wound in the left leg.  This initially started as a hematoma after running into a walker.  This was then the summer around July.  The wound has not completely healed and after the hematoma developed she eventually had skin slough.  She was recently seen by Dr. Marla Roe and debrided.  Her granddaughter was with her for the office visit today and has been participating in the wound care and thinks that the wound may be somewhat improved but has not really shrunk much.  Patient does not really describe claudication symptoms.  She does not have rest pain.  She has no prior history of nonhealing wounds.     Past Medical History:  Diagnosis Date  . Anxiety   . Bowel obstruction ([place]) 1950  . Cancer of septum of nose (HCC)    melanoma  . Depression   . GERD (gastroesophageal reflux disease)   . Hypertension   . Hypothyroidism    Past Surgical History:  Procedure Laterality Date  . APPLICATION OF A-CELL OF EXTREMITY Left 10/17/2019   Procedure: APPLICATION OF INTEGRA BILAYER WOUND MATRIX OF EXTREMITY;  Surgeon: Wallace Going, DO;  Location: [facility];  Service: Plastics;  Laterality: Left;  . BIOPSY  12/07/2017   Procedure: BIOPSY;  Surgeon: Rush Landmark Telford Nab., MD;  Location: [facility];  Service: Gastroenterology;;  . CHOLECYSTECTOMY    . ESOPHAGOGASTRODUODENOSCOPY (EGD) WITH PROPOFOL N/A 12/07/2017   Procedure: ESOPHAGOGASTRODUODENOSCOPY (EGD) WITH PROPOFOL;  Surgeon: Rush Landmark Telford Nab., MD;  Location: [facility];  Service: Gastroenterology;  Laterality: N/A;  May need Dilation  . hysterecrtomy    . I & D EXTREMITY Left 10/17/2019   Procedure: IRRIGATION AND DEBRIDEMENT EXTREMITY;  Surgeon: Wallace Going, DO;  Location: [facility];  Service: Plastics;   Laterality: Left;  45 min  . left hip surgery    . ROTATOR CUFF REPAIR Right   . SMALL INTESTINE SURGERY      Family History  Problem Relation Age of Onset  . Uterine cancer Mother   . Hypertension Father   . Colon cancer Neg Hx   . Esophageal cancer Neg Hx   . Liver disease Neg Hx   . Inflammatory bowel disease Neg Hx   . Pancreatic cancer Neg Hx   . Rectal cancer Neg Hx   . Stomach cancer Neg Hx     SOCIAL HISTORY: Social History   Socioeconomic History  . Marital status: Widowed    Spouse name: Not on file  . Number of children: 8  . Years of education: Not on file  . Highest education level: Not on file  Occupational History  . Not on file  Tobacco Use  . Smoking status: Never Smoker  . Smokeless tobacco: Never Used  Vaping Use  . Vaping Use: Never used  Substance and Sexual Activity  . Alcohol use: No    Comment: wine/rare  . Drug use: No  . Sexual activity: Not on file  Other Topics Concern  . Not on file  Social History Narrative  . Not on file   Social Determinants of Health   Financial Resource Strain:   . Difficulty of Paying Living Expenses: Not on file  Food Insecurity:   . Worried About Charity fundraiser in the Last Year: Not on file  .  Ran Out of Food in the Last Year: Not on file  Transportation Needs:   . Lack of Transportation (Medical): Not on file  . Lack of Transportation (Non-Medical): Not on file  Physical Activity:   . Days of Exercise per Week: Not on file  . Minutes of Exercise per Session: Not on file  Stress:   . Feeling of Stress : Not on file  Social Connections:   . Frequency of Communication with Friends and Family: Not on file  . Frequency of Social Gatherings with Friends and Family: Not on file  . Attends Religious Services: Not on file  . Active Member of Clubs or Organizations: Not on file  . Attends Archivist Meetings: Not on file  . Marital Status: Not on file  Intimate Partner Violence:   . Fear of  Current or Ex-Partner: Not on file  . Emotionally Abused: Not on file  . Physically Abused: Not on file  . Sexually Abused: Not on file    Allergies  Allergen Reactions  . Brimonidine   . Codeine Other (See Comments)  . Levofloxacin Other (See Comments)  . Lidocaine   . Meloxicam   . Oxycodone   . Pregabalin   . Sulfa Antibiotics Other (See Comments)  . Timolol Maleate   . Zolpidem   . Gabapentin Rash    Pt did not like the way it made her feel    Current Outpatient Medications  Medication Sig Dispense Refill  . ALPRAZolam (XANAX) 0.25 MG tablet Take 1 tablet (0.25 mg total) by mouth 3 (three) times daily. 90 tablet 5  . betamethasone valerate ointment (VALISONE) 0.1 % 1 application.    . cephALEXin (KEFLEX) 125 MG/5ML suspension Take 5 mLs (125 mg total) by mouth daily. 100 mL 11  . CEQUA 0.09 % SOLN Apply 1 drop to eye 2 (two) times daily.    . cetirizine (ZYRTEC) 10 MG tablet Take 1 tablet (10 mg total) by mouth daily. 90 tablet 3  . Cholecalciferol (VITAMIN D3) 50 MCG (2000 UT) TABS Take 2,000 Units by mouth daily.    Marland Kitchen docusate sodium (DULCOLAX) 100 MG capsule Take 100 mg by mouth 2 (two) times daily as needed (for constipation).     . DULoxetine (CYMBALTA) 20 MG capsule Take 1 capsule (20 mg total) by mouth daily. 30 capsule 3  . ferrous sulfate 325 (65 FE) MG EC tablet Take by mouth.    Marland Kitchen FLUAD QUADRIVALENT 0.5 ML injection     . furosemide (LASIX) 10 MG/ML solution Take 1 mL (10 mg total) by mouth daily. 5 mL 2  . Ginkgo Biloba 120 MG CAPS Take 120 mg by mouth daily.    . Lactobacillus (PROBIOTIC ACIDOPHILUS PO) Take 1 capsule by mouth daily.    Marland Kitchen levothyroxine (SYNTHROID) 75 MCG tablet TAKE 1 TABLET BY MOUTH EVERY DAY 90 tablet 2  . Liniments (SALONPAS PAIN RELIEF PATCH EX) Place 1 patch onto the skin daily as needed (for shoulder pain.).    Marland Kitchen loperamide (IMODIUM) 2 MG capsule Take by mouth as needed for diarrhea or loose stools.    . Menthol, Topical Analgesic, 154  MG PADS Place 1 each onto the skin daily as needed (for hip pain.).    Marland Kitchen Multiple Vitamin (MULTIVITAMIN) capsule Take 1 capsule by mouth daily.    . pantoprazole (PROTONIX) 40 MG tablet Take 1 tablet (40 mg total) by mouth 2 (two) times daily. 90 tablet 3  . PROLIA 60 MG/ML SOSY  injection Take 60 mg by mouth every 6 (six) months.  0  . Pumpkin Seed-Soy Germ (AZO BLADDER CONTROL/GO-LESS PO) Take 1 tablet by mouth daily as needed (for bladder pain.).    Marland Kitchen RESTASIS 0.05 % ophthalmic emulsion     . silver sulfADIAZINE (SILVADENE) 1 % cream Apply to affected area 6 times daily as needed    . traMADol (ULTRAM) 50 MG tablet TAKE 1 TABLET BY MOUTH 3 TIMES DAILY AS NEEDED FOR PAIN 60 tablet 2  . trolamine salicylate (ASPERCREME) 10 % cream Apply 1 application topically 3 (three) times daily as needed for muscle pain.    . hydrocortisone cream 0.5 % Apply 1 application topically 2 (two) times daily. 30 g 0  . losartan (COZAAR) 100 MG tablet TAKE 1 TABLET BY MOUTH EVERY DAY 90 tablet 2   No current facility-administered medications for this visit.    ROS:   General:  No weight loss, Fever, chills  HEENT: No recent headaches, no nasal bleeding, no visual changes, no sore throat  Neurologic: No dizziness, blackouts, seizures. No recent symptoms of stroke or mini- stroke. No recent episodes of slurred speech, or temporary blindness.  Cardiac: No recent episodes of chest pain/pressure, no shortness of breath at rest.  No shortness of breath with exertion.  Denies history of atrial fibrillation or irregular heartbeat  Vascular: No history of rest pain in feet.  No history of claudication.  No history of non-healing ulcer, No history of DVT   Pulmonary: No home oxygen, no productive cough, no hemoptysis,  No asthma or wheezing  Musculoskeletal:  [ ]  Arthritis, [ ]  Low back pain,  [ ]  Joint pain  Hematologic:No history of hypercoagulable state.  No history of easy bleeding.  No history of  anemia  Gastrointestinal: No hematochezia or melena,  No gastroesophageal reflux, no trouble swallowing  Urinary: [ ]  chronic Kidney disease, [ ]  on HD - [ ]  MWF or [ ]  TTHS, [ ]  Burning with urination, [ ]  Frequent urination, [ ]  Difficulty urinating;   Skin: No rashes  Psychological: No history of anxiety,  No history of depression   Physical Examination  Vitals:   11/14/19 1453  BP: (!) 101/59  Pulse: 82  Resp: 14  Temp: (!) 97.2 F (36.2 C)  TempSrc: Temporal  SpO2: 97%  Weight: 111 lb (50.3 kg)  Height: 4\' 11"  (1.499 m)    Body mass index is 22.42 kg/m.  General:  Alert and oriented, no acute distress HEENT: Normal Neck: No JVD Cardiac: Regular Rate and Rhythm Skin: No rash, 5 x 5 cm wound left lower leg just above the left medial malleolus less than 1 mm depth with what appears to be 100% granulation tissue although there is a dressing that is partially transparent over this. Extremity Pulses:  2+ radial, brachial, femoral, right side dorsalis pedis absent left popliteal and pedal pulses Musculoskeletal: Pretibial and pedal edema left leg Neurologic: Upper and lower extremity motor 5/5 and symmetric  DATA:  She had a venous reflux exam today which showed minimal venous reflux in the left common femoral and mid left saphenous vein.  ASSESSMENT: Nonhealing wound left leg.  She may have an arterial component to her slow healing.  However, the wound appears fairly healthy currently.  She is currently being followed by Dr. Marla Roe.   PLAN: Patient will return in 2 to 3 weeks to see if she has had any improvement in the wound diameter.  She will have an  arterial duplex at that office exam.  If the wound fails to heal or is progressing we would consider an arteriogram.  I do not believe she needs any venous intervention as she has minimal reflux on the left side.   Ruta Hinds, MD [facility] Office: [phone]

## 2019-11-15 ENCOUNTER — Other Ambulatory Visit: Payer: Self-pay

## 2019-11-15 DIAGNOSIS — I739 Peripheral vascular disease, unspecified: Secondary | ICD-10-CM

## 2019-11-15 DIAGNOSIS — I872 Venous insufficiency (chronic) (peripheral): Secondary | ICD-10-CM

## 2019-11-16 ENCOUNTER — Ambulatory Visit: Payer: Medicare HMO | Admitting: Surgical

## 2019-11-19 ENCOUNTER — Other Ambulatory Visit: Payer: Self-pay

## 2019-11-19 DIAGNOSIS — I739 Peripheral vascular disease, unspecified: Secondary | ICD-10-CM

## 2019-11-20 ENCOUNTER — Telehealth: Payer: Self-pay

## 2019-11-20 ENCOUNTER — Ambulatory Visit (INDEPENDENT_AMBULATORY_CARE_PROVIDER_SITE_OTHER): Payer: Medicare HMO | Admitting: Plastic Surgery

## 2019-11-20 ENCOUNTER — Encounter: Payer: Self-pay | Admitting: Family Medicine

## 2019-11-20 ENCOUNTER — Other Ambulatory Visit: Payer: Self-pay

## 2019-11-20 ENCOUNTER — Encounter: Payer: Self-pay | Admitting: Plastic Surgery

## 2019-11-20 VITALS — BP 113/66 | HR 91

## 2019-11-20 DIAGNOSIS — S81802A Unspecified open wound, left lower leg, initial encounter: Secondary | ICD-10-CM | POA: Diagnosis not present

## 2019-11-20 NOTE — Progress Notes (Signed)
   Subjective:    Patient ID: Kayla Lloyd, female    DOB: 1923-06-29, 86 y.o.   MRN: [id number]  The patient is a 86 year old female here with her granddaughter for follow-up on her left leg wound.  Overall she is doing much better.  The pain has improved.  She has been doing dressing changes at home with her granddaughter.  The Integra is completely incorporated.  She has filled in the defect very nicely.  The swelling and redness has improved.  And she has seen the vascular surgeon.    Review of Systems  Constitutional: Negative for activity change and appetite change.  HENT: Negative.   Eyes: Negative.   Respiratory: Negative.   Cardiovascular: Negative.   Genitourinary: Negative.   Musculoskeletal: Positive for gait problem.       Objective:   Physical Exam Vitals and nursing note reviewed.  Constitutional:      Appearance: Normal appearance.  HENT:     Head: Normocephalic and atraumatic.  Cardiovascular:     Rate and Rhythm: Normal rate.  Pulmonary:     Effort: Pulmonary effort is normal.  Musculoskeletal:       Legs:  Neurological:     Mental Status: She is alert. Mental status is at baseline.  Psychiatric:        Mood and Affect: Mood normal.          Assessment & Plan:     ICD-10-CM   1. Wound of left lower extremity, initial encounter  S81.802A     I removed the silicone sheet today.  We will get endoform sent to her house.  She will change the Adaptic every other day and cover it with KY and gauze until the package arrives.  When the package arrives then she will place the endoform directly on the wound every couple of days.  If she still sees the endoform on there that she does not need to place another sheet but to wait another day or 2.  She should cover the endoform with Adaptic and K-Y jelly and gauze.  I would like to see her back in 2 weeks.

## 2019-11-20 NOTE — Telephone Encounter (Signed)
Faxed [facility] order for : kerlix, 4x4, surgilube daily. Adaptic-every other day. Endoform every 3-4 days

## 2019-11-20 NOTE — Telephone Encounter (Signed)
FYI for Wednesday  Pt granddaughter Kayla Lloyd is coming in 11/21/2019 to drop off paperwork to be filled out. This paperwork is supposed to be for Kayla Lloyd to be reimbursed to take care of [place]. Family care giver form of some kind. Kayla Lloyd wants to be positive this is not a form to have [place] admitted to a home of any kind.   Originally was going to do Nurse visit with her to discuss but after speaking over the phone with he as well as consulting Y[place]eline it is best she drop off forms and we assess from there.

## 2019-11-21 ENCOUNTER — Telehealth: Payer: Self-pay | Admitting: Family Medicine

## 2019-11-21 ENCOUNTER — Other Ambulatory Visit: Payer: Self-pay

## 2019-11-21 ENCOUNTER — Ambulatory Visit: Payer: Self-pay

## 2019-11-21 DIAGNOSIS — S81802A Unspecified open wound, left lower leg, initial encounter: Secondary | ICD-10-CM | POA: Diagnosis not present

## 2019-11-21 DIAGNOSIS — E039 Hypothyroidism, unspecified: Secondary | ICD-10-CM

## 2019-11-21 MED ORDER — LEVOTHYROXINE SODIUM 75 MCG PO TABS: 75.0000 ug | ORAL_TABLET | Freq: Every day | ORAL | 0 refills | Status: DC

## 2019-11-21 NOTE — Telephone Encounter (Signed)
Pts care giving (granddaughter) came up to office and is needing med refilled. She has not call pharmacy. Please advise.  levothyroxine (SYNTHROID) 75 MCG tablet [[id number]]   DULoxetine (CYMBALTA) 20 MG capsule [[id number]]

## 2019-11-21 NOTE — Telephone Encounter (Signed)
I have spoken to pt's daughter and she has given [place] the paperwork to talk about an the upcoming office visit. This is going to allow pt daughter or granddaughter to get some income while taking care of the pt. This form is not to send pt to a home.

## 2019-11-21 NOTE — Telephone Encounter (Signed)
Ok. Thanks!

## 2019-11-21 NOTE — Telephone Encounter (Signed)
Cymbalta refill not needed until December of 2021. A curtesy refill sent in for Synthroid. Pt will need to keep appt with Dr.Greene on 12/10/2019 for more refills.

## 2019-11-29 ENCOUNTER — Encounter: Payer: Self-pay | Admitting: Vascular Surgery

## 2019-11-29 ENCOUNTER — Ambulatory Visit (INDEPENDENT_AMBULATORY_CARE_PROVIDER_SITE_OTHER): Payer: Medicare HMO | Admitting: Vascular Surgery

## 2019-11-29 ENCOUNTER — Ambulatory Visit (HOSPITAL_COMMUNITY)
Admission: RE | Admit: 2019-11-29 | Discharge: 2019-11-29 | Disposition: A | Discharge status: Home / Self Care | Payer: Medicare HMO | Source: Ambulatory Visit | Attending: Vascular Surgery | Admitting: Vascular Surgery

## 2019-11-29 ENCOUNTER — Other Ambulatory Visit: Payer: Self-pay

## 2019-11-29 VITALS — BP 110/70 | HR 77 | Temp 97.0°F | Resp 18 | Ht 59.0 in | Wt 111.0 lb

## 2019-11-29 DIAGNOSIS — I739 Peripheral vascular disease, unspecified: Secondary | ICD-10-CM | POA: Insufficient documentation

## 2019-11-29 DIAGNOSIS — S81802D Unspecified open wound, left lower leg, subsequent encounter: Secondary | ICD-10-CM | POA: Diagnosis not present

## 2019-11-29 NOTE — Progress Notes (Signed)
Patient is a 86 year old female with several month history of nonhealing wound on the left leg.  She was last seen October 2021.  The wound initially began as a hematoma after running into a walker.  She recently had this debrided by Dr. Marla Roe from plastic surgery.  Previous venous reflux exam showed minimal vein reflux.  She returns today for further evaluation of an arterial source.  Review of systems:  Physical exam:  Vitals:   11/29/19 1451  BP: 110/70  Pulse: 77  Resp: 18  Temp: (!) 97 F (36.1 C)  SpO2: 97%  Weight: 111 lb (50.3 kg)  Height: 4\' 11"  (1.499 m)   Left lower extremity: Some edema 7 x 5 cm wound left medial malleolus area flush with skin and healthy-appearing granulation tissue  Data: Patient had a lower extremity arterial duplex exam of her left leg today which showed no significant flow-limiting stenosis.  Assessment: Healing left lower extremity wound has adequate arterial circulation for wound healing and no significant venous reflux to suggest she needs any intervention for her vascular system.  The patient will continue follow-up wound care with Dr. Elisabeth Cara.  She will follow up with me on as-needed basis.  Ruta Hinds, MD [facility] Office: [phone]

## 2019-12-03 DIAGNOSIS — H53413 Scotoma involving central area, bilateral: Secondary | ICD-10-CM | POA: Diagnosis not present

## 2019-12-04 ENCOUNTER — Other Ambulatory Visit: Payer: Self-pay | Admitting: Registered Nurse

## 2019-12-04 NOTE — Telephone Encounter (Signed)
Patient is requesting a refill of the following medications: Requested Prescriptions   Pending Prescriptions Disp Refills   traMADol (ULTRAM) 50 MG tablet [Pharmacy Med Name: tramadol 50 mg tablet] 60 tablet 2    Sig: TAKE 1 TABLET BY MOUTH 3 TIMES DAILY AS NEEDED FOR PAIN    Date of patient request: 12/04/2019 Last office visit: 09/24/2019 Date of last refill: 09/06/2019 Last refill amount: 60 tabs

## 2019-12-04 NOTE — Telephone Encounter (Signed)
Requested medication (s) are due for refill today: yes  Requested medication (s) are on the active medication list: yes  Last refill: 09/06/19  Future visit scheduled: yes  Notes to clinic:  not delegated    Requested Prescriptions  Pending Prescriptions Disp Refills   traMADol (ULTRAM) 50 MG tablet [Pharmacy Med Name: tramadol 50 mg tablet] 60 tablet 2    Sig: TAKE 1 TABLET BY MOUTH 3 TIMES DAILY AS NEEDED FOR PAIN      Not Delegated - Analgesics:  Opioid Agonists Failed - 12/04/2019  8:08 AM      Failed - This refill cannot be delegated      Failed - Urine Drug Screen completed in last 360 days      Passed - Valid encounter within last 6 months    Recent Outpatient Visits           2 months ago Cellulitis of left anterior lower leg   [facility], Lilia Argue, MD   2 months ago Traumatic hematoma   [facility], Lilia Argue, MD   3 months ago Dysuria   [facility], Delfino Lovett, NP   3 months ago Dysuria   [facility], Ranell Patrick, MD   3 months ago Abnormal liver function test   [facility], Lilia Argue, MD       Future Appointments             In 6 days Wendie Agreste, MD [facility] at [place], [facility]

## 2019-12-06 ENCOUNTER — Encounter: Payer: Self-pay | Admitting: Surgical

## 2019-12-06 NOTE — Progress Notes (Signed)
   Subjective:     Patient ID: [name], female    DOB: 1923/09/25, 86 y.o.   MRN: [id number]  No chief complaint on file.   HPI: Patient is a 86 year old female here for follow-up on her left leg wound which developed after hematoma.  She was last seen in the office on 11/20/2019 with Dr. Marla Roe.  It was recommended she start endoform dressing changes at that time.  They have been doing endoform dressing changes.  She is here with her granddaughter.  Granddaughter reports that her mother has been helping the patient with dressing changes.  Patient reports she is overall doing well.  She has no complaints in regards to pain.  No infectious symptoms.  Review of Systems  Constitutional: Negative for chills and fever.  Skin: Positive for wound.     Objective:   Vital Signs There were no vitals taken for this visit. Vital Signs and Nursing Note Reviewed Physical Exam Constitutional:      General: She is not in acute distress.    Appearance: She is not ill-appearing.     Comments: Elderly female, in wheelchair  HENT:     Head: Normocephalic and atraumatic.  Skin:         Comments: Pitting edema noted over left foot, 1+.  2+ DP pulse noted.  Normal sensation of distal extremity.  Neurological:     General: No focal deficit present.     Mental Status: She is alert.  Psychiatric:        Mood and Affect: Mood normal.        Behavior: Behavior normal.       Assessment/Plan:     ICD-10-CM   1. Wound of left lower extremity, subsequent encounter  S81.802D    Recommend continuing with endoform dressing changes.  Cover endoform with 4 x 4, Kerlix, Ace wrap. We did discuss the use of a compression sock, however granddaughter reports that patient is unable to apply sock and reports it is painful with removal.  During today's visit I was able to cleanse the wound of fibrinous exudate which revealed a healthy base of granulation tissue.  There is no sign of  infection.  Recommend calling with questions or concerns. Follow up scheduled for 2 to 3 weeks.  Pictures were obtained of the patient and placed in the chart with the patient's or guardian's permission.    Carola Rhine Vanice Rappa, PA-C 12/06/2019, 4:30 PM

## 2019-12-07 ENCOUNTER — Encounter: Payer: Self-pay | Admitting: Surgical

## 2019-12-07 ENCOUNTER — Other Ambulatory Visit: Payer: Self-pay

## 2019-12-07 ENCOUNTER — Ambulatory Visit (INDEPENDENT_AMBULATORY_CARE_PROVIDER_SITE_OTHER): Payer: Medicare HMO | Admitting: Surgical

## 2019-12-07 VITALS — BP 96/45 | HR 72 | Temp 98.3°F

## 2019-12-07 DIAGNOSIS — S81802D Unspecified open wound, left lower leg, subsequent encounter: Secondary | ICD-10-CM

## 2019-12-10 ENCOUNTER — Ambulatory Visit (INDEPENDENT_AMBULATORY_CARE_PROVIDER_SITE_OTHER): Payer: Medicare HMO | Admitting: Family Medicine

## 2019-12-10 ENCOUNTER — Encounter: Payer: Self-pay | Admitting: Family Medicine

## 2019-12-10 ENCOUNTER — Other Ambulatory Visit: Payer: Self-pay | Admitting: Family Medicine

## 2019-12-10 ENCOUNTER — Other Ambulatory Visit: Payer: Self-pay

## 2019-12-10 VITALS — BP 106/60 | HR 83 | Temp 98.3°F | Ht 59.0 in | Wt 111.0 lb

## 2019-12-10 DIAGNOSIS — F411 Generalized anxiety disorder: Secondary | ICD-10-CM

## 2019-12-10 DIAGNOSIS — G8929 Other chronic pain: Secondary | ICD-10-CM

## 2019-12-10 DIAGNOSIS — T148XXA Other injury of unspecified body region, initial encounter: Secondary | ICD-10-CM

## 2019-12-10 DIAGNOSIS — M549 Dorsalgia, unspecified: Secondary | ICD-10-CM

## 2019-12-10 DIAGNOSIS — S81802D Unspecified open wound, left lower leg, subsequent encounter: Secondary | ICD-10-CM

## 2019-12-10 DIAGNOSIS — K219 Gastro-esophageal reflux disease without esophagitis: Secondary | ICD-10-CM

## 2019-12-10 DIAGNOSIS — H3530 Unspecified macular degeneration: Secondary | ICD-10-CM

## 2019-12-10 DIAGNOSIS — E039 Hypothyroidism, unspecified: Secondary | ICD-10-CM | POA: Diagnosis not present

## 2019-12-10 NOTE — Patient Instructions (Addendum)
I will work on your paperwork, and let you know when ready.     If you have lab work done today you will be contacted with your lab results within the next 2 weeks.  If you have not heard from [place] then please contact us. The fastest way to get your results is to register for My Chart.   IF you received an x-ray today, you will receive an invoice from [facility]. Please contact [facility] at [phone] with questions or concerns regarding your invoice.   IF you received labwork today, you will receive an invoice from [facility]. Please contact [facility] at [phone] with questions or concerns regarding your invoice.   Our billing staff will not be able to assist you with questions regarding bills from these companies.  You will be contacted with the lab results as soon as they are available. The fastest way to get your results is to activate your My Chart account. Instructions are located on the last page of this paperwork. If you have not heard from [place] regarding the results in 2 weeks, please contact this office.

## 2019-12-10 NOTE — Progress Notes (Signed)
Subjective:  Patient ID: Kayla Lloyd, female    DOB: [birth year]  Age: 86 y.o. MRN: [id number]  CC:  Chief Complaint  Patient presents with  . Establish Care    PT reports other than pain in her joints from her arthritis she feels fine. Pt would like [place] to fill out some paper work for her doughter to be her at home care giver.    HPI Kayla Lloyd presents for   Establish care. Prior pt of Dr. Pamella Pert, and prior Dr. Nolon Rod. recently treated by plastic surgery for difficulty healing left leg wound.  Evaluated by vascular surgery recently indicating adequate arterial circulation for wound healing, and no significant venous reflux.  Here with great granddaughter.  Has paperwork for Stephen is granddaughter - lives with Clarene Critchley.  She provides Ms. Zheng her meds in pillminder and dispenses them, prepares her food,  Able to bathe self, but Clarene Critchley is helping with the wound care. Has chair in tub, and grab bars. Off Prolia for now with wound in leg. [name] receives wound car kits from plastic surgery to provide wound care.   Anxiety - taking xanax 0.25mg  tid - longstanding, working well without new side effects.   On synthroid for hypothyoidism. Lab Results  Component Value Date   TSH 0.716 06/26/2019   GERD - hx of ulcers. GI - Dr. Rush Landmark. protonix BID working well.    Chronic back and shoulder pain - takes ultram 3 times per day, no new side effects.   Foot neuropathy - takes cymbalta. Did not tolerate gabapentin - rash.   Bowel/bladder program - supplements, special toilet, incontinence pads If needed for constipation.   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 [facility] and [employer].  Has a low vision specialist with occupational therapy that visits about once per week or so that helps set up things at home.     History Patient Active Problem List   Diagnosis Date Noted  . Leg  wound, left 10/09/2019  . Vaginitis, atrophic 05/02/2019  . Need for prophylactic vaccination against Streptococcus pneumoniae (pneumococcus) 04/24/2019  . Vaginitis and vulvovaginitis 04/03/2019  . Chronic UTI 04/03/2019  . Dysphagia 10/17/2017  . Abnormal barium swallow 10/17/2017  . Gastroesophageal reflux disease 10/17/2017  . OAB (overactive bladder) 09/21/2017  . Pharyngeal dysphagia 07/11/2017  . Urinary incontinence in female 11/30/2016  . Pelvic relaxation 11/30/2016  . Dysuria 11/30/2016  . Vaginal atrophy 11/30/2016  . Essential hypertension 02/06/2014  . Thyroid activity decreased 02/06/2014  . Edema 05/03/2013  . Anemia 10/29/2011  . Arthritis 10/29/2011  . B12 deficiency 10/29/2011  . Gastro-esophageal reflux disease with esophagitis 10/29/2011  . Hyperlipidemia 10/29/2011  . Seasonal allergies 10/29/2011   Past Medical History:  Diagnosis Date  . Anxiety   . Bowel obstruction ([place]) 1950  . Cancer of septum of nose ([facility])    melanoma  . Depression   . GERD (gastroesophageal reflux disease)   . Hypertension   . Hypothyroidism    Past Surgical History:  Procedure Laterality Date  . APPLICATION OF A-CELL OF EXTREMITY Left 10/17/2019   Procedure: APPLICATION OF INTEGRA BILAYER WOUND MATRIX OF EXTREMITY;  Surgeon: Wallace Going, DO;  Location: [place];  Service: Plastics;  Laterality: Left;  . BIOPSY  12/07/2017   Procedure: BIOPSY;  Surgeon: Rush Landmark Telford Nab., MD;  Location: [facility];  Service: Gastroenterology;;  . CHOLECYSTECTOMY    . ESOPHAGOGASTRODUODENOSCOPY (  EGD) WITH PROPOFOL N/A 12/07/2017   Procedure: ESOPHAGOGASTRODUODENOSCOPY (EGD) WITH PROPOFOL;  Surgeon: Rush Landmark Telford Nab., MD;  Location: [facility];  Service: Gastroenterology;  Laterality: N/A;  May need Dilation  . hysterecrtomy    . I & D EXTREMITY Left 10/17/2019   Procedure: IRRIGATION AND DEBRIDEMENT EXTREMITY;  Surgeon: Wallace Going, DO;   Location: [place];  Service: Plastics;  Laterality: Left;  45 min  . left hip surgery    . ROTATOR CUFF REPAIR Right   . SMALL INTESTINE SURGERY     Allergies  Allergen Reactions  . Brimonidine   . Codeine Other (See Comments)  . Levofloxacin Other (See Comments)  . Lidocaine   . Meloxicam   . Oxycodone   . Pregabalin   . Sulfa Antibiotics Other (See Comments)  . Timolol Maleate   . Zolpidem   . Gabapentin Rash    Pt did not like the way it made her feel   Prior to Admission medications   Medication Sig Start Date End Date Taking? Authorizing Provider  ALPRAZolam (XANAX) 0.25 MG tablet Take 1 tablet (0.25 mg total) by mouth 3 (three) times daily. 06/26/19  Yes Rutherford Guys, MD  betamethasone valerate ointment (VALISONE) 0.1 % 1 application. 02/28/19  Yes [provider]  cephALEXin (KEFLEX) 125 MG/5ML suspension Take 5 mLs (125 mg total) by mouth daily. 10/18/19  Yes Rutherford Guys, MD  CEQUA 0.09 % SOLN Apply 1 drop to eye 2 (two) times daily. 10/10/18  Yes [provider]  cetirizine (ZYRTEC) 10 MG tablet Take 1 tablet (10 mg total) by mouth daily. 01/04/19  Yes Forrest Moron, MD  Cholecalciferol (VITAMIN D3) 50 MCG (2000 UT) TABS Take 2,000 Units by mouth daily.   Yes [provider]  docusate sodium (DULCOLAX) 100 MG capsule Take 100 mg by mouth 2 (two) times daily as needed (for constipation).  07/13/16  Yes [provider]  DULoxetine (CYMBALTA) 20 MG capsule Take 1 capsule (20 mg total) by mouth daily. 10/04/19  Yes Rutherford Guys, MD  ferrous sulfate 325 (65 FE) MG EC tablet Take by mouth.   Yes [provider]  FLUAD QUADRIVALENT 0.5 ML injection  12/22/18  Yes [provider]  furosemide (LASIX) 10 MG/ML solution Take 1 mL (10 mg total) by mouth daily. 09/18/19  Yes Rutherford Guys, MD  Ginkgo Biloba 120 MG CAPS Take 120 mg by mouth daily.   Yes [provider]  Lactobacillus (PROBIOTIC  ACIDOPHILUS PO) Take 1 capsule by mouth daily.   Yes [provider]  levothyroxine (SYNTHROID) 75 MCG tablet Take 1 tablet (75 mcg total) by mouth daily. 11/21/19  Yes Wendie Agreste, MD  Lin[facility] PAIN RELIEF PATCH EX) Place 1 patch onto the skin daily as needed (for shoulder pain.).   Yes [provider]  loperamide (IMODIUM) 2 MG capsule Take by mouth as needed for diarrhea or loose stools.   Yes [provider]  Menthol, Topical Analgesic, 154 MG PADS Place 1 each onto the skin daily as needed (for hip pain.).   Yes [provider]  Multiple Vitamin (MULTIVITAMIN) capsule Take 1 capsule by mouth daily.   Yes [provider]  pantoprazole (PROTONIX) 40 MG tablet Take 1 tablet (40 mg total) by mouth 2 (two) times daily. 05/14/19  Yes Forrest Moron, MD  PROLIA 60 MG/ML SOSY injection Take 60 mg by mouth every 6 (six) months. 09/15/17  Yes  [provider]  Pumpkin Seed-Soy Germ (AZO BLADDER CONTROL/GO-LESS PO) Take 1 tablet by mouth daily as needed (for bladder pain.).   Yes [provider]  RESTASIS 0.05 % ophthalmic emulsion  10/18/18  Yes [provider]  silver sulfADIAZINE (SILVADENE) 1 % cream Apply to affected area 6 times daily as needed 03/16/19  Yes [provider]  traMADol (ULTRAM) 50 MG tablet TAKE 1 TABLET BY MOUTH 3 TIMES DAILY AS NEEDED FOR PAIN 12/04/19  Yes Maximiano Coss, NP  trolamine salicylate (ASPERCREME) 10 % cream Apply 1 application topically 3 (three) times daily as needed for muscle pain.   Yes [provider]   Social History   Socioeconomic History  . Marital status: Widowed    Spouse name: Not on file  . Number of children: 8  . Years of education: Not on file  . Highest education level: Not on file  Occupational History  . Not on file  Tobacco Use  . Smoking status: Never Smoker  . Smokeless tobacco: Never Used  Vaping Use  . Vaping Use: Never used   Substance and Sexual Activity  . Alcohol use: No    Comment: wine/rare  . Drug use: No  . Sexual activity: Not on file  Other Topics Concern  . Not on file  Social History Narrative  . Not on file   Social Determinants of Health   Financial Resource Strain:   . Difficulty of Paying Living Expenses: Not on file  Food Insecurity:   . Worried About Charity fundraiser in the Last Year: Not on file  . Ran Out of Food in the Last Year: Not on file  Transportation Needs:   . Lack of Transportation (Medical): Not on file  . Lack of Transportation (Non-Medical): Not on file  Physical Activity:   . Days of Exercise per Week: Not on file  . Minutes of Exercise per Session: Not on file  Stress:   . Feeling of Stress : Not on file  Social Connections:   . Frequency of Communication with Friends and Family: Not on file  . Frequency of Social Gatherings with Friends and Family: Not on file  . Attends Religious Services: Not on file  . Active Member of Clubs or Organizations: Not on file  . Attends Archivist Meetings: Not on file  . Marital Status: Not on file  Intimate Partner Violence:   . Fear of Current or Ex-Partner: Not on file  . Emotionally Abused: Not on file  . Physically Abused: Not on file  . Sexually Abused: Not on file    Review of Systems Per hpi.   Objective:   Vitals:   12/10/19 1614  BP: 106/60  Pulse: 83  Temp: 98.3 F (36.8 C)  TempSrc: Temporal  SpO2: 98%  Weight: 111 lb (50.3 kg)  Height: 4\' 11"  (1.499 m)     Physical Exam Vitals reviewed.  Constitutional:      Appearance: She is well-developed.  HENT:     Head: Normocephalic and atraumatic.  Eyes:     Conjunctiva/sclera: Conjunctivae normal.     Pupils: Pupils are equal, round, and reactive to light.  Neck:     Vascular: No carotid bruit.  Cardiovascular:     Rate and Rhythm: Normal rate and regular rhythm.     Heart sounds: Normal heart sounds.  Pulmonary:     Effort:  Pulmonary effort is normal.     Breath sounds: Normal breath sounds.  Abdominal:     Palpations: Abdomen is soft. There is no pulsatile mass.     Tenderness: There is no abdominal tenderness.  Musculoskeletal:     Comments: Bandage in place LE.   Skin:    General: Skin is warm and dry.  Neurological:     Mental Status: She is alert and oriented to person, place, and time.  Psychiatric:        Behavior: Behavior normal.       38 minutes spent during visit, greater than 50% counseling and assimilation of information, chart review, and discussion of plan.   Assessment & Plan:  Kerston Landeck is a 86 y.o. female . Wound of left lower extremity, subsequent encounter, Traumatic hematoma  Under specialty care as above. Home treatment with bandage changes and other assistance required as above with suoport form her daughter.   Acquired hypothyroidism  tsh stable in June. No med changes.   Gastroesophageal reflux disease, unspecified whether esophagitis present  - stable with current PPI dosing.   Macular degeneration of left eye, unspecified type  - continued specialty care with optho.   Anxiety state  - stable with current med regimen. Risks of meds discussed. Understanding expressed.   Chronic back pain, unspecified back location, unspecified back pain laterality  - tramadol continued with risks discussed.   No orders of the defined types were placed in this encounter.  Patient Instructions   I will work on your paperwork, and let you know when ready.     If you have lab work done today you will be contacted with your lab results within the next 2 weeks.  If you have not heard from [place] then please contact us. The fastest way to get your results is to register for My Chart.   IF you received an x-ray today, you will receive an invoice from [facility]. Please contact [facility] at [phone] with questions or concerns regarding your invoice.    IF you received labwork today, you will receive an invoice from [facility]. Please contact [facility] at [phone] with questions or concerns regarding your invoice.   Our billing staff will not be able to assist you with questions regarding bills from these companies.  You will be contacted with the lab results as soon as they are available. The fastest way to get your results is to activate your My Chart account. Instructions are located on the last page of this paperwork. If you have not heard from [place] regarding the results in 2 weeks, please contact this office.                                                 Signed, Merri Ray, MD [facility]

## 2019-12-11 MED ORDER — LEVOTHYROXINE SODIUM 75 MCG PO TABS: 75.0000 ug | ORAL_TABLET | Freq: Every day | ORAL | 0 refills | Status: DC

## 2019-12-11 MED ORDER — ALPRAZOLAM 0.25 MG PO TABS
0.2500 mg | ORAL_TABLET | Freq: Three times a day (TID) | ORAL | 5 refills | Status: DC
Start: 2019-12-11 — End: 2020-06-26

## 2019-12-11 NOTE — Telephone Encounter (Signed)
Patient is requesting a refill of the following medications: Requested Prescriptions   Pending Prescriptions Disp Refills  . ALPRAZolam (XANAX) 0.25 MG tablet 90 tablet 5    Sig: Take 1 tablet (0.25 mg total) by mouth 3 (three) times daily.    Date of patient request: 12/10/2019 Last office visit: 12/10/2019 Date of last refill: 06/26/2019 Last refill amount: 90x5

## 2019-12-11 NOTE — Telephone Encounter (Signed)
Controlled substance database (PDMP) reviewed.discussed at office visit yesterday.  Refill of alprazolam placed when due for refill.

## 2019-12-16 ENCOUNTER — Encounter: Payer: Self-pay | Admitting: Family Medicine

## 2019-12-17 ENCOUNTER — Telehealth: Payer: Self-pay

## 2019-12-17 NOTE — Telephone Encounter (Signed)
I called pt and informed her that her paper work from health and human services has been filled out by doctor Carlota Raspberry and is waiting for pick up.

## 2019-12-19 ENCOUNTER — Encounter: Payer: Self-pay | Admitting: Family Medicine

## 2019-12-24 DIAGNOSIS — S81802A Unspecified open wound, left lower leg, initial encounter: Secondary | ICD-10-CM | POA: Diagnosis not present

## 2019-12-24 MED ORDER — LOSARTAN POTASSIUM 100 MG PO TABS
100.0000 mg | ORAL_TABLET | Freq: Every day | ORAL | 1 refills | Status: DC
Start: 2019-12-24 — End: 2020-06-27

## 2019-12-25 ENCOUNTER — Encounter: Payer: Self-pay | Admitting: Surgical

## 2019-12-25 ENCOUNTER — Ambulatory Visit (INDEPENDENT_AMBULATORY_CARE_PROVIDER_SITE_OTHER): Payer: Medicare HMO | Admitting: Surgical

## 2019-12-25 ENCOUNTER — Other Ambulatory Visit: Payer: Self-pay | Admitting: Family Medicine

## 2019-12-25 ENCOUNTER — Other Ambulatory Visit: Payer: Self-pay

## 2019-12-25 VITALS — BP 124/67 | HR 77 | Temp 98.1°F

## 2019-12-25 DIAGNOSIS — E039 Hypothyroidism, unspecified: Secondary | ICD-10-CM

## 2019-12-25 DIAGNOSIS — S81802D Unspecified open wound, left lower leg, subsequent encounter: Secondary | ICD-10-CM

## 2019-12-25 DIAGNOSIS — S81802A Unspecified open wound, left lower leg, initial encounter: Secondary | ICD-10-CM | POA: Diagnosis not present

## 2019-12-25 NOTE — Progress Notes (Signed)
° °  Subjective:     Patient ID: Kayla Lloyd, female    DOB: [birth year], 86 y.o.   MRN: [id number]  Chief Complaint  Patient presents with   Follow-up    HPI: The patient is a 86 y.o. female here for follow-up on her left leg wound.  Patient recently evaluated by vascular surgery showing no arterial circulation or venous reflux issues that could contribute to delayed healing. They have been doing endoform dressing changes every few days, they have also been changing the Adaptic and applying K-Y jelly every other day. She is here with her granddaughter today. She feels as if she is doing well, has no complaints.  No infectious symptoms.  Review of Systems  Constitutional: Negative.   Skin: Positive for wound. Negative for color change.     Objective:   Vital Signs BP 124/67 (BP Location: Left Arm, Patient Position: Sitting, Cuff Size: Normal)    Pulse 77    Temp 98.1 F (36.7 C) (Oral)    SpO2 98%  Vital Signs and Nursing Note Reviewed Physical Exam Constitutional:      Comments: Thin elderly female  Skin:    General: Skin is warm and dry.          Comments: Left lower extremity wound with healthy base of granulation tissue exposed after removal of multiple layers of dried endoform. No purulence or foul odor is noted. 2+ DP pulse noted. Mildly tender to palpation of the periwound area. No cellulitic changes. Some periwound irritation noted. Wound is approximately 7 x 3.5 cm.  Neurological:     General: No focal deficit present.     Mental Status: She is alert and oriented to person, place, and time.  Psychiatric:        Mood and Affect: Mood normal.        Behavior: Behavior normal.     Assessment/Plan:     ICD-10-CM   1. Wound of left lower extremity, subsequent encounter  S81.802D     Recommend continuing with endoform dressing changes every few days, apply Adaptic and K-Y jelly over the endoform. Cover with 4 x 4 gauze, ABD pad and Kerlix. I discussed  with the patient and her granddaughter that if the endoform has not yet incorporated, do not apply additional layer on top. It appears as if there was a thick layer of dried endoform covering the wound bed. There is no sign of infection. They are doing a great job at dressing changes.  Recommend calling with questions or concerns. Follow up scheduled for 3 weeks.  Pictures were obtained of the patient and placed in the chart with the patient's or guardian's permission.   Carola Rhine Leotha Westermeyer, PA-C 12/25/2019, 2:18 PM

## 2019-12-26 NOTE — Telephone Encounter (Signed)
Called and spoke with pharmacy they informed me the medication is ready and waiting for the pt to pick up. Joy at [facility] stated she will call the pt.

## 2019-12-27 DIAGNOSIS — H53413 Scotoma involving central area, bilateral: Secondary | ICD-10-CM | POA: Diagnosis not present

## 2020-01-09 ENCOUNTER — Ambulatory Visit: Payer: Medicare HMO | Admitting: Podiatry

## 2020-01-10 ENCOUNTER — Encounter: Payer: Self-pay | Admitting: Family Medicine

## 2020-01-10 ENCOUNTER — Ambulatory Visit (INDEPENDENT_AMBULATORY_CARE_PROVIDER_SITE_OTHER): Payer: Medicare HMO

## 2020-01-10 ENCOUNTER — Other Ambulatory Visit: Payer: Self-pay

## 2020-01-10 ENCOUNTER — Ambulatory Visit (INDEPENDENT_AMBULATORY_CARE_PROVIDER_SITE_OTHER): Payer: Medicare HMO | Admitting: Family Medicine

## 2020-01-10 VITALS — BP 123/69 | HR 83 | Temp 98.4°F | Ht 59.0 in | Wt 110.0 lb

## 2020-01-10 DIAGNOSIS — S81802D Unspecified open wound, left lower leg, subsequent encounter: Secondary | ICD-10-CM | POA: Diagnosis not present

## 2020-01-10 DIAGNOSIS — G8929 Other chronic pain: Secondary | ICD-10-CM

## 2020-01-10 DIAGNOSIS — M7989 Other specified soft tissue disorders: Secondary | ICD-10-CM | POA: Diagnosis not present

## 2020-01-10 DIAGNOSIS — R600 Localized edema: Secondary | ICD-10-CM | POA: Diagnosis not present

## 2020-01-10 DIAGNOSIS — M25511 Pain in right shoulder: Secondary | ICD-10-CM

## 2020-01-10 DIAGNOSIS — M25571 Pain in right ankle and joints of right foot: Secondary | ICD-10-CM

## 2020-01-10 DIAGNOSIS — E039 Hypothyroidism, unspecified: Secondary | ICD-10-CM | POA: Diagnosis not present

## 2020-01-10 MED ORDER — FUROSEMIDE 20 MG PO TABS: 10.0000 mg | ORAL_TABLET | Freq: Every day | ORAL | 5 refills | Status: DC

## 2020-01-10 NOTE — Progress Notes (Signed)
Subjective:  Patient ID: [name], female    DOB: 30-Apr-1923  Age: 86 y.o. MRN: [id number]  CC:  Chief Complaint  Patient presents with  . Follow-up    On last OV. Pt reports wound on her L leg is doing better Granddaughter has been keeping it clean and changing the bandages. Pt reports she has noticed her R ankle having the same symptoms at her L even though their isn't a wound on the Foot at all. Pt complains that the Keflex liquid medication that was given to her is to bitter and she can't take it requesting a alternat medication. Pt wants a new referral to a different ortho provider she doesn't like the one she currently sees.  . paper work    Pt nedes [place] to fax over an updated medication list and diagnosis list to medicare for the pt paper work from last visit was missing information.    HPI Duha Abair presents for   Follow-up from November 15. Chronic medical history discussed at that time.  Left leg wound, difficult healing. See prior notes, treated by plastic surgery for difficulty healing left leg wound, evaluated by vascular surgery indicating adequate arterial circulation for wound healing and no significant venous reflux.  Granddaughter has been providing her care regarding this chronic leg wound, has received wound care kits from plastic surgery.  Has required assistance at home including pill minder and dispensing the medication as well as wound care from her granddaughter, community alternative program paperwork completed last visit. Last plastic surgery note from November 30 reviewed.  Recommended continuing no form dressing changes every 3 days, Adaptic and K-Y jelly over endoform, 4 x 4 gauze, abdominal padding, Kerlix. No sign of infection. 3-week follow-up. Has appt Monday.   R ankle concern:  Pain in R ankle past few weeks, no falls/injuries. Here with granddaughter. Tried plantar fasciitis sleeve 1 week ago recommended by podiatry. Used  for about 101min, uncomfortable. And ankle sore since use of that device. Has appt with foot specialist in a few weeks. No open wounds.  Had ankle surgery few years ago.  Swelling in lower legs during day, does not use compression stockings - not comfortable. Swelling improves at night.  Has taken furosemide - trouble with pills? Trouble breaking in half. Tried liquid 10mg /ml - 10mg  per day -  bad taste and made choke. Off furosemide recently Pain at heel at night with neuropathy (takes cymbalta - did not tolerate gabapentin).  Hypothyroidism: Lab Results  Component Value Date   TSH 0.716 06/26/2019   Stable in June, Synthroid 75 mcg daily.  R shoulder pain: Seen by ortho - Dr. Tamera Punt, would like to see different provider.  Chronic back and shoulder pain. Tramadol once or twice per day. Some relief, not complete. tylenol 325 every other day when more painful. Helps some.   History Patient Active Problem List   Diagnosis Date Noted  . Leg wound, left 10/09/2019  . Vaginitis, atrophic 05/02/2019  . Need for prophylactic vaccination against Streptococcus pneumoniae (pneumococcus) 04/24/2019  . Vaginitis and vulvovaginitis 04/03/2019  . Chronic UTI 04/03/2019  . Dysphagia 10/17/2017  . Abnormal barium swallow 10/17/2017  . Gastroesophageal reflux disease 10/17/2017  . OAB (overactive bladder) 09/21/2017  . Pharyngeal dysphagia 07/11/2017  . Urinary incontinence in female 11/30/2016  . Pelvic relaxation 11/30/2016  . Dysuria 11/30/2016  . Vaginal atrophy 11/30/2016  . Essential hypertension 02/06/2014  . Thyroid activity decreased 02/06/2014  . Edema 05/03/2013  .  Anemia 10/29/2011  . Arthritis 10/29/2011  . B12 deficiency 10/29/2011  . Gastro-esophageal reflux disease with esophagitis 10/29/2011  . Hyperlipidemia 10/29/2011  . Seasonal allergies 10/29/2011   Past Medical History:  Diagnosis Date  . Anxiety   . Bowel obstruction ([place]) 1950  . Cancer of septum of nose (HCC)     melanoma  . Depression   . GERD (gastroesophageal reflux disease)   . Hypertension   . Hypothyroidism    Past Surgical History:  Procedure Laterality Date  . APPLICATION OF A-CELL OF EXTREMITY Left 10/17/2019   Procedure: APPLICATION OF INTEGRA BILAYER WOUND MATRIX OF EXTREMITY;  Surgeon: Wallace Going, DO;  Location: [place];  Service: Plastics;  Laterality: Left;  . BIOPSY  12/07/2017   Procedure: BIOPSY;  Surgeon: Rush Landmark Telford Nab., MD;  Location: [facility];  Service: Gastroenterology;;  . CHOLECYSTECTOMY    . ESOPHAGOGASTRODUODENOSCOPY (EGD) WITH PROPOFOL N/A 12/07/2017   Procedure: ESOPHAGOGASTRODUODENOSCOPY (EGD) WITH PROPOFOL;  Surgeon: Rush Landmark Telford Nab., MD;  Location: [facility];  Service: Gastroenterology;  Laterality: N/A;  May need Dilation  . hysterecrtomy    . I & D EXTREMITY Left 10/17/2019   Procedure: IRRIGATION AND DEBRIDEMENT EXTREMITY;  Surgeon: Wallace Going, DO;  Location: [place];  Service: Plastics;  Laterality: Left;  45 min  . left hip surgery    . ROTATOR CUFF REPAIR Right   . SMALL INTESTINE SURGERY     Allergies  Allergen Reactions  . Brimonidine   . Codeine Other (See Comments)  . Levofloxacin Other (See Comments)  . Lidocaine   . Meloxicam   . Oxycodone   . Pregabalin   . Sulfa Antibiotics Other (See Comments)  . Timolol Maleate   . Zolpidem   . Gabapentin Rash    Pt did not like the way it made her feel   Prior to Admission medications   Medication Sig Start Date End Date Taking? Authorizing Provider  ALPRAZolam (XANAX) 0.25 MG tablet Take 1 tablet (0.25 mg total) by mouth 3 (three) times daily. 12/11/19  Yes Wendie Agreste, MD  betamethasone valerate ointment (VALISONE) 0.1 % 1 application. 02/28/19  Yes [provider]  cephALEXin (KEFLEX) 125 MG/5ML suspension Take 5 mLs (125 mg total) by mouth daily. 10/18/19  Yes Jacelyn Pi, Irma M, MD  CEQUA 0.09 % SOLN Apply  1 drop to eye 2 (two) times daily. 10/10/18  Yes [provider]  cetirizine (ZYRTEC) 10 MG tablet Take 1 tablet (10 mg total) by mouth daily. 01/04/19  Yes Forrest Moron, MD  Cholecalciferol (VITAMIN D3) 50 MCG (2000 UT) TABS Take 2,000 Units by mouth daily.   Yes [provider]  docusate sodium (COLACE) 100 MG capsule Take 100 mg by mouth 2 (two) times daily as needed (for constipation).  07/13/16  Yes [provider]  DULoxetine (CYMBALTA) 20 MG capsule Take 1 capsule (20 mg total) by mouth daily. 10/04/19  Yes Jacelyn Pi, Lilia Argue, MD  ferrous sulfate 325 (65 FE) MG EC tablet Take by mouth.   Yes [provider]  FLUAD QUADRIVALENT 0.5 ML injection  12/22/18  Yes [provider]  furosemide (LASIX) 10 MG/ML solution Take 1 mL (10 mg total) by mouth daily. 09/18/19  Yes Jacelyn Pi, Lilia Argue, MD  Ginkgo Biloba 120 MG CAPS Take 120 mg by mouth daily.   Yes [provider]  Lactobacillus (PROBIOTIC ACIDOPHILUS PO) Take 1 capsule by mouth daily.   Yes  [provider]  levothyroxine (SYNTHROID) 75 MCG tablet Take 1 tablet (75 mcg total) by mouth daily. 12/11/19  Yes Wendie Agreste, MD  [facility] PAIN RELIEF PATCH EX) Place 1 patch onto the skin daily as needed (for shoulder pain.).   Yes [provider]  loperamide (IMODIUM) 2 MG capsule Take by mouth as needed for diarrhea or loose stools.   Yes [provider]  losartan (COZAAR) 100 MG tablet Take 1 tablet (100 mg total) by mouth daily. 12/24/19  Yes Wendie Agreste, MD  Menthol, Topical Analgesic, 154 MG PADS Place 1 each onto the skin daily as needed (for hip pain.).   Yes [provider]  Multiple Vitamin (MULTIVITAMIN) capsule Take 1 capsule by mouth daily.   Yes [provider]  pantoprazole (PROTONIX) 40 MG tablet Take 1 tablet (40 mg total) by mouth 2 (two) times daily. 05/14/19  Yes Forrest Moron, MD  PROLIA 60 MG/ML SOSY  injection Take 60 mg by mouth every 6 (six) months. 09/15/17  Yes [provider]  Pumpkin Seed-Soy Germ (AZO BLADDER CONTROL/GO-LESS PO) Take 1 tablet by mouth daily as needed (for bladder pain.).   Yes [provider]  RESTASIS 0.05 % ophthalmic emulsion  10/18/18  Yes [provider]  silver sulfADIAZINE (SILVADENE) 1 % cream Apply to affected area 6 times daily as needed 03/16/19  Yes [provider]  traMADol (ULTRAM) 50 MG tablet TAKE 1 TABLET BY MOUTH 3 TIMES DAILY AS NEEDED FOR PAIN 12/04/19  Yes Maximiano Coss, NP  trolamine salicylate (ASPERCREME) 10 % cream Apply 1 application topically 3 (three) times daily as needed for muscle pain.   Yes [provider]   Social History   Socioeconomic History  . Marital status: Widowed    Spouse name: Not on file  . Number of children: 8  . Years of education: Not on file  . Highest education level: Not on file  Occupational History  . Not on file  Tobacco Use  . Smoking status: Never Smoker  . Smokeless tobacco: Never Used  Vaping Use  . Vaping Use: Never used  Substance and Sexual Activity  . Alcohol use: No    Comment: wine/rare  . Drug use: No  . Sexual activity: Not on file  Other Topics Concern  . Not on file  Social History Narrative  . Not on file   Social Determinants of Health   Financial Resource Strain: Not on file  Food Insecurity: Not on file  Transportation Needs: Not on file  Physical Activity: Not on file  Stress: Not on file  Social Connections: Not on file  Intimate Partner Violence: Not on file    Review of Systems  Per HPI  Objective:   Vitals:   01/10/20 1414  BP: 123/69  Pulse: 83  Temp: 98.4 F (36.9 C)  TempSrc: Temporal  SpO2: 97%  Weight: 110 lb (49.9 kg)  Height: 4\' 11"  (1.499 m)     Physical Exam Vitals reviewed.  Constitutional:      General: She is not in acute distress.    Appearance: She is well-developed and well-nourished.   HENT:     Head: Normocephalic and atraumatic.  Eyes:     Extraocular Movements: EOM normal.     Conjunctiva/sclera: Conjunctivae normal.     Pupils: Pupils are equal, round, and reactive to light.  Neck:     Vascular: No carotid bruit.  Cardiovascular:     Rate  and Rhythm: Normal rate and regular rhythm.     Pulses: Intact distal pulses.     Heart sounds: Normal heart sounds.  Pulmonary:     Effort: Pulmonary effort is normal.     Breath sounds: Normal breath sounds.  Abdominal:     Palpations: Abdomen is soft. There is no pulsatile mass.     Tenderness: There is no abdominal tenderness.  Musculoskeletal:     Right lower leg: Edema present.     Left lower leg: Edema (with bandage in lower leg in place. ) present.     Comments: R ankle - healed scar anterior ankle - ttp across dorsal ankle, to both malleoli but no focal bony ttp.  No bony ttp of foot. Intact ankle rom.   R shoulder - brace in place, but guarded with minimal ROM.   Skin:    General: Skin is warm and dry.  Neurological:     Mental Status: She is alert and oriented to person, place, and time.  Psychiatric:        Mood and Affect: Mood and affect and mood normal.        Behavior: Behavior normal.      39 minutes spent during visit, greater than 50% counseling and assimilation of information, chart review, and discussion of plan.   DG Ankle Complete Right  Result Date: 01/10/2020 CLINICAL DATA:  86 year old female with right ankle pain. No known injury. EXAM: RIGHT ANKLE - COMPLETE 3+ VIEW COMPARISON:  Right lower extremity radiograph dated 08/24/2019. FINDINGS: There is no acute fracture or dislocation. The bones are osteopenic. The ankle mortise is intact. There is diffuse subcutaneous edema and mild soft tissue swelling of the ankle. No radiopaque foreign object or soft tissue gas. IMPRESSION: No acute fracture or dislocation. Electronically Signed   By: Anner Crete M.D.   On: 01/10/2020 15:43     Assessment & Plan:  Tequisha Maahs is a 86 y.o. female . Pedal edema - Plan: furosemide (LASIX) 20 MG tablet, Comprehensive metabolic panel  -Check CMP.  Reordered Lasix as tablet, can split in half at 10 mg.  New pill splitter may be needed.  Intolerant to liquid formulation.  Wound of left lower extremity, subsequent encounter  -Subjectively improved, bandage was left intact, has close follow-up with wound care specialist.  Right ankle pain, unspecified chronicity - Plan: DG Ankle Complete Right  -Appears to be more anterior ankle, wondering if some of this may be due to the edema and tight feeling along with her previous neuropathy.  No known injury.  X-ray without acute bony findings.  Try restarting diuretic as above, follow-up with podiatry as planned, RTC precautions if acute worsening or new symptoms.  Chronic right shoulder pain - Plan: Ambulatory referral to Orthopedic Surgery  -Requests different orthopedist, referral placed.  Has tramadol if needed for pain, but might start with Tylenol if she has significant relief.  Monitor LFTs as above.  Acquired hypothyroidism - Plan: TSH  -Tolerating current regimen, check TSH.  No changes.   Meds ordered this encounter  Medications  . furosemide (LASIX) 20 MG tablet    Sig: Take 0.5 tablets (10 mg total) by mouth daily.    Dispense:  15 tablet    Refill:  5   Patient Instructions    We can try 1/2 of the 20mg  lasix once per day for leg swelling.  Keep follow up with specialist for left leg and foot specialist to discuss right ankle pain.  Treating swelling may help as well, but if any concerns on xray I will let you know.  Tylenol few times per day if needed or Tramadol.  I will place a new referral for your shoulder pain.  Return to the clinic or go to the nearest emergency room if any of your symptoms worsen or new symptoms occur.     If you have lab work done today you will be contacted with your lab results  within the next 2 weeks.  If you have not heard from [place] then please contact us. The fastest way to get your results is to register for My Chart.   IF you received an x-ray today, you will receive an invoice from [facility]. Please contact [facility] at [phone] with questions or concerns regarding your invoice.   IF you received labwork today, you will receive an invoice from [facility]. Please contact [facility] at [phone] with questions or concerns regarding your invoice.   Our billing staff will not be able to assist you with questions regarding bills from these companies.  You will be contacted with the lab results as soon as they are available. The fastest way to get your results is to activate your My Chart account. Instructions are located on the last page of this paperwork. If you have not heard from [place] regarding the results in 2 weeks, please contact this office.         Signed, Merri Ray, MD [facility]

## 2020-01-10 NOTE — Patient Instructions (Addendum)
  We can try 1/2 of the 20mg  lasix once per day for leg swelling.  Keep follow up with specialist for left leg and foot specialist to discuss right ankle pain. Treating swelling may help as well, but if any concerns on xray I will let you know.  Tylenol few times per day if needed or Tramadol.  I will place a new referral for your shoulder pain.  Return to the clinic or go to the nearest emergency room if any of your symptoms worsen or new symptoms occur.     If you have lab work done today you will be contacted with your lab results within the next 2 weeks.  If you have not heard from [place] then please contact us. The fastest way to get your results is to register for My Chart.   IF you received an x-ray today, you will receive an invoice from [facility]. Please contact [facility] at [phone] with questions or concerns regarding your invoice.   IF you received labwork today, you will receive an invoice from [place]. Please contact [facility] at [phone] with questions or concerns regarding your invoice.   Our billing staff will not be able to assist you with questions regarding bills from these companies.  You will be contacted with the lab results as soon as they are available. The fastest way to get your results is to activate your My Chart account. Instructions are located on the last page of this paperwork. If you have not heard from [place] regarding the results in 2 weeks, please contact this office.

## 2020-01-11 LAB — COMPREHENSIVE METABOLIC PANEL
ALT: 9 IU/L (ref 0–32)
AST: 16 IU/L (ref 0–40)
Albumin/Globulin Ratio: 1.6 (ref 1.2–2.2)
Albumin: 4 g/dL (ref 3.5–4.6)
Alkaline Phosphatase: 79 IU/L (ref 44–121)
BUN/Creatinine Ratio: 28 (ref 12–28)
BUN: 19 mg/dL (ref 10–36)
Bilirubin Total: 0.4 mg/dL (ref 0.0–1.2)
CO2: 28 mmol/L (ref 20–29)
Calcium: 9.4 mg/dL (ref 8.7–10.3)
Chloride: 96 mmol/L (ref 96–106)
Creatinine, Ser: 0.68 mg/dL (ref 0.57–1.00)
GFR calc Af Amer: 87 mL/min/{1.73_m2} (ref >59)
GFR calc non Af Amer: 75 mL/min/{1.73_m2} (ref >59)
Globulin, Total: 2.5 g/dL (ref 1.5–4.5)
Glucose: 78 mg/dL (ref 65–99)
Potassium: 4.9 mmol/L (ref 3.5–5.2)
Sodium: 135 mmol/L (ref 134–144)
Total Protein: 6.5 g/dL (ref 6.0–8.5)

## 2020-01-11 LAB — TSH: TSH: 1.1 u[IU]/mL (ref 0.450–4.500)

## 2020-01-14 ENCOUNTER — Other Ambulatory Visit: Payer: Self-pay

## 2020-01-14 ENCOUNTER — Encounter: Payer: Self-pay | Admitting: Surgical

## 2020-01-14 ENCOUNTER — Ambulatory Visit (INDEPENDENT_AMBULATORY_CARE_PROVIDER_SITE_OTHER): Payer: Medicare HMO | Admitting: Surgical

## 2020-01-14 VITALS — BP 115/65 | HR 62 | Temp 98.4°F

## 2020-01-14 DIAGNOSIS — S81802D Unspecified open wound, left lower leg, subsequent encounter: Secondary | ICD-10-CM | POA: Diagnosis not present

## 2020-01-14 NOTE — Progress Notes (Signed)
Patient is a 86 year old female here for follow-up on her left lower extremity wound after trauma and hematoma.  Patient underwent excision of the leg wound with placement of Integra on 10/17/2019 with Dr. Marla Roe.  Patient has been applying endoform dressing changes with assistance of her  daughter.  She is here with her granddaughter today.  She reports that pain has been easily controlled with Tylenol and aspirin.  She is not having any infectious symptoms.  They have been careful to not apply too many endoform dressings.  On exam patient is in no acute distress, sitting up in wheelchair, elderly female. Pulmonary: Unlabored breathing. On exam left lower extremity wound with good base of granulation tissue.  Small amount of new epithelium noted at the edges.  The wound today is 6.5 x 3 cm.  There is no surrounding erythema or cellulitic changes.  She does have some purplish discoloration of her distal extremity.  She does have a 2+ DP pulse noted.  She reports normal sensation of the distal lower extremity.  She has good capillary refill of her distal extremity.  I discussed with the patient and her granddaughter that the wound is taking longer to heal than I expected.  We discussed that at her next follow-up is there is not much improvement we will adjust change in wound care regimen.  I do not see any sign of infection.  I do not see any sign of vascular compromise.  She has good pulses noted.  There is no foul odors noted.  Recommend continuing with endoform dressing changes.  Recommend following up in 3 to 4 weeks for reevaluation.  Patient was placed in the patient's chart with permission of the patient.

## 2020-01-15 ENCOUNTER — Encounter: Payer: Self-pay | Admitting: Family Medicine

## 2020-01-23 ENCOUNTER — Ambulatory Visit: Payer: Medicare HMO | Admitting: Podiatry

## 2020-01-23 ENCOUNTER — Telehealth: Payer: Self-pay

## 2020-01-23 NOTE — Telephone Encounter (Signed)
At last OV pt's family asked for a up to date medication list and medical condition list be faxed over to [facility] Fax #[phone]. Phone number is [phone]. According to pt's family. This information has been faxed to tamie at the fax# above

## 2020-01-24 ENCOUNTER — Ambulatory Visit (INDEPENDENT_AMBULATORY_CARE_PROVIDER_SITE_OTHER): Payer: Medicare HMO | Admitting: Podiatry

## 2020-01-24 ENCOUNTER — Other Ambulatory Visit: Payer: Self-pay

## 2020-01-24 DIAGNOSIS — M79675 Pain in left toe(s): Secondary | ICD-10-CM

## 2020-01-24 DIAGNOSIS — B351 Tinea unguium: Secondary | ICD-10-CM

## 2020-01-24 DIAGNOSIS — M79674 Pain in right toe(s): Secondary | ICD-10-CM | POA: Diagnosis not present

## 2020-01-24 DIAGNOSIS — T148XXA Other injury of unspecified body region, initial encounter: Secondary | ICD-10-CM

## 2020-01-24 DIAGNOSIS — G629 Polyneuropathy, unspecified: Secondary | ICD-10-CM

## 2020-01-25 ENCOUNTER — Other Ambulatory Visit: Payer: Self-pay | Admitting: Family Medicine

## 2020-01-25 ENCOUNTER — Encounter: Payer: Self-pay | Admitting: Family Medicine

## 2020-01-25 DIAGNOSIS — N952 Postmenopausal atrophic vaginitis: Secondary | ICD-10-CM

## 2020-01-26 ENCOUNTER — Encounter: Payer: Self-pay | Admitting: Family Medicine

## 2020-01-27 NOTE — Telephone Encounter (Signed)
Requested Prescriptions  Pending Prescriptions Disp Refills  . cetirizine (ZYRTEC) 10 MG tablet [Pharmacy Med Name: Allergy Relief (cetirizine) 10 mg tablet] 90 tablet 3    Sig: TAKE 1 TABLET BY MOUTH EVERY DAY     Ear, Nose, and Throat:  Antihistamines Passed - 01/25/2020  8:40 AM      Passed - Valid encounter within last 12 months    Recent Outpatient Visits          2 weeks ago Pedal edema   [facility] at [facility], Asencion Partridge, MD   1 month ago Wound of left lower extremity, subsequent encounter   [facility] at [facility], Asencion Partridge, MD   4 months ago Cellulitis of left anterior lower leg   [facility] at [facility], Meda Coffee, MD   4 months ago Traumatic hematoma   [facility] at [facility], Meda Coffee, MD   4 months ago Dysuria   [facility], Gerlene Burdock, NP      Future Appointments            In 2 weeks Neva Seat Asencion Partridge, MD [facility] at [place], [facility]           . DULoxetine (CYMBALTA) 20 MG capsule [Pharmacy Med Name: duloxetine 20 mg capsule,delayed release] 90 capsule 0    Sig: TAKE 1 CAPSULE BY MOUTH EVERY DAY     Psychiatry: Antidepressants - SNRI Passed - 01/25/2020  8:40 AM      Passed - Last BP in normal range    BP Readings from Last 1 Encounters:  01/14/20 115/65         Passed - Valid encounter within last 6 months    Recent Outpatient Visits          2 weeks ago Pedal edema   [facility], Asencion Partridge, MD   1 month ago Wound of left lower extremity, subsequent encounter   [facility], Asencion Partridge, MD   4 months ago Cellulitis of left anterior lower leg   [facility] at [facility], Meda Coffee, MD   4 months ago Traumatic hematoma   [facility] at [facility], Meda Coffee, MD   4 months ago Dysuria   [facility], Gerlene Burdock, NP      Future Appointments            In 2 weeks Neva Seat Asencion Partridge, MD Primary [facility] at [place], [facility]

## 2020-01-28 NOTE — Telephone Encounter (Signed)
Pt requesting Premarin Cream 0.625 mg/g please advise last OV 01/10/2020

## 2020-01-29 MED ORDER — PREMARIN 0.625 MG/GM VA CREA: 0.5000 | TOPICAL_CREAM | VAGINAL | 5 refills | Status: DC

## 2020-01-29 NOTE — Telephone Encounter (Signed)
Refill ordered

## 2020-01-29 NOTE — Progress Notes (Signed)
Subjective: 87 y.o. returns the office today for painful, elongated, thickened toenails which she cannot trim herself. Denies any redness or drainage around the nails.  Presents today with her granddaughter.  She does have a wound to the left leg has been treated by plastic surgery.  In general she does get burning, tingling and pain to both feet which is been a chronic issue.  Denies any acute changes since last appointment and no new complaints today. Denies any systemic complaints such as fevers, chills, nausea, vomiting.   PCP: Shade Flood, MD   Objective: NAD DP/PT pulses palpable, CRT less than 3 seconds Nails hypertrophic, dystrophic, elongated, brittle, discolored 10. There is tenderness overlying the nails 1-5 bilaterally. There is no surrounding erythema or drainage along the nail sites. No open lesions or pre-ulcerative lesions are identified on the right leg but there is bruising present.  There is a bandage intact the left side from the wound and the plastic surgery stream. Describes mild bilateral tenderness to both feet.  There is no area of pinpoint tenderness identified today.  Describes more burning, tingling. No other areas of tenderness bilateral lower extremities. No overlying edema, erythema, increased warmth. No pain with calf compression, swelling, warmth, erythema.  Assessment: Patient presents with symptomatic onychomycosis; neuropathy  Plan: -Treatment options including alternatives, risks, complications were discussed -Nails sharply debrided 10 without complication/bleeding. -Continue current medications.  Hold off any further medications as she recently started Cymbalta.  We discussed possibly increasing milligrams if needed but would recommend follow-up with her primary care physician. -Discussed daily foot inspection. If there are any changes, to call the office immediately.  -Bruising present on the right leg without any injury.  There is no skin  breakdown.  Continue compression bandage that she has been using which is been helpful. -Follow-up in 3 months or sooner if any problems are to arise. In the meantime, encouraged to call the office with any questions, concerns, changes symptoms.  Ovid Curd, DPM

## 2020-01-31 ENCOUNTER — Other Ambulatory Visit: Payer: Self-pay | Admitting: Registered Nurse

## 2020-01-31 DIAGNOSIS — U071 COVID-19: Secondary | ICD-10-CM | POA: Diagnosis not present

## 2020-01-31 NOTE — Telephone Encounter (Signed)
Discussed in November 2021.  Controlled substance database (PDMP) reviewed. No concerns appreciated. Last Rx provided 01/11/20, 12/24/19. rx ordered.

## 2020-02-01 ENCOUNTER — Encounter: Payer: Medicare HMO | Admitting: Family Medicine

## 2020-02-04 ENCOUNTER — Telehealth: Payer: Self-pay

## 2020-02-04 NOTE — Telephone Encounter (Signed)
Patient's granddaughter called to say she contacted [facility] to request more supplies for her grandmother and they told her to contact us as the prescription has expired.  She said that the patient is using the 4x4 pads and wrap-around gauze.  Please call.

## 2020-02-05 NOTE — Telephone Encounter (Signed)
Returned granddaughters call. Patient is out of kerlix, 4x4 gauze (every day), 5x5 adaptic (every other day), 5x5 endoform 5x5 (every 3-4 days). Faxed new order to [facility].

## 2020-02-12 ENCOUNTER — Encounter: Payer: Self-pay | Admitting: Family Medicine

## 2020-02-14 ENCOUNTER — Other Ambulatory Visit: Payer: Self-pay

## 2020-02-14 ENCOUNTER — Encounter: Payer: Self-pay | Admitting: Surgical

## 2020-02-14 ENCOUNTER — Telehealth (INDEPENDENT_AMBULATORY_CARE_PROVIDER_SITE_OTHER): Payer: Medicare HMO | Admitting: Surgical

## 2020-02-14 DIAGNOSIS — S81802D Unspecified open wound, left lower leg, subsequent encounter: Secondary | ICD-10-CM

## 2020-02-14 NOTE — Progress Notes (Signed)
Patient is a 87 year old female who presents virtually with her family to discuss her left lower extremity wound after trauma and hematoma.  Patient underwent excision of the leg wound with placement of Integra on 10/17/2019 with Dr. Marla Roe and we have been doing local wound care.   The patient is with her granddaughter today who is her primary caregiver and who has been assisting her with dressing changes.  They report that they have been changing the endoform approximately every 4 days and rewrapping the leg every other day.  They were able to send me a photo through my chart today for me to evaluate.  They feel as if things are improving.  She reports that pain has been mild and easily manageable.  She does report that she has been having bilateral pain in her arms, reports a history of bilateral tendon repair and is scheduled to see her PCP to discuss this and have a referral to orthopedics soon.  She is not having any infectious symptoms.  The patient gave consent to have this visit done by telemedicine / virtual visit, two identifiers were used to identify patient. This is also consent for access the chart and treat the patient via this visit. The patient is located at home.  I, the provider, am at the office.  We spent 16 minutes together for the visit.  Joined by telephone.     We discussed changing from endoform dressing changes to collagen dressing changes every other day.  I discussed that the collagen would assist with epithelialization.  The endoform does not seem to have been providing as much improvement as I would like.  On evaluation of the photo sent through EMR, I do not notice any signs of infection.  There does appear to be some exudate within the wound bed but it is mostly clean appearing without any concerns noted.  In regards to dressing changes I recommend collagen, 4 x 4 gauze, Kerlix and Medipore tape every other day.  I would like to see her in person in 2 to 3 weeks for  follow-up and reevaluation.  The wound is approximately 6.5 cm x 2.5 cm and appears slightly hyper granulated.  I recommend they call with any questions or concerns prior to her scheduled appointment.

## 2020-02-15 ENCOUNTER — Encounter: Payer: Self-pay | Admitting: Family Medicine

## 2020-02-15 ENCOUNTER — Telehealth: Payer: Self-pay

## 2020-02-15 ENCOUNTER — Telehealth (INDEPENDENT_AMBULATORY_CARE_PROVIDER_SITE_OTHER): Payer: Medicare HMO | Admitting: Family Medicine

## 2020-02-15 VITALS — Ht 59.0 in | Wt 110.0 lb

## 2020-02-15 DIAGNOSIS — M25571 Pain in right ankle and joints of right foot: Secondary | ICD-10-CM

## 2020-02-15 DIAGNOSIS — R600 Localized edema: Secondary | ICD-10-CM | POA: Diagnosis not present

## 2020-02-15 DIAGNOSIS — K5900 Constipation, unspecified: Secondary | ICD-10-CM | POA: Diagnosis not present

## 2020-02-15 DIAGNOSIS — M25511 Pain in right shoulder: Secondary | ICD-10-CM

## 2020-02-15 DIAGNOSIS — N952 Postmenopausal atrophic vaginitis: Secondary | ICD-10-CM | POA: Diagnosis not present

## 2020-02-15 DIAGNOSIS — G8929 Other chronic pain: Secondary | ICD-10-CM | POA: Diagnosis not present

## 2020-02-15 NOTE — Patient Instructions (Addendum)
Good talking to you today.  Continue stool softener, but miralax may also be needed if you are having more difficulty with hard stools.  Fiber supplement like citrucel or metamucil may help as well. See other info below   Half furosemide pill up to daily, but you can try that every other day to see if just as effective. If you use that intermittently and swelling returns, go back to once per day.   I would recommend discussing other treatments with gynecology if you continue to have irritation in the vaginal area. Ok to continue the to premarin cream small amount per day. If needed temporarily, small amount of over the counter hydrocortisone for itching for a day or two only.   Call Dr. Lou Miner office for appointment, [phone] for your shoulder. I did refer you in December.  Let me know if there are questions.   Return to the clinic or go to the nearest emergency room if any of your symptoms worsen or new symptoms occur.   Constipation, Adult Constipation is when a person has fewer than three bowel movements in a week, has difficulty having a bowel movement, or has stools (feces) that are dry, hard, or larger than normal. Constipation may be caused by an underlying condition. It may become worse with age if a person takes certain medicines and does not take in enough fluids. Follow these instructions at home: Eating and drinking  Eat foods that have a lot of fiber, such as beans, whole grains, and fresh fruits and vegetables.  Limit foods that are low in fiber and high in fat and processed sugars, such as fried or sweet foods. These include french fries, hamburgers, cookies, candies, and soda.  Drink enough fluid to keep your urine pale yellow.   General instructions  Exercise regularly or as told by your health care provider. Try to do 150 minutes of moderate exercise each week.  Use the bathroom when you have the urge to go. Do not hold it in.  Take over-the-counter and  prescription medicines only as told by your health care provider. This includes any fiber supplements.  During bowel movements: ? Practice deep breathing while relaxing the lower abdomen. ? Practice pelvic floor relaxation.  Watch your condition for any changes. Let your health care provider know about them.  Keep all follow-up visits as told by your health care provider. This is important. Contact a health care provider if:  You have pain that gets worse.  You have a fever.  You do not have a bowel movement after 4 days.  You vomit.  You are not hungry or you lose weight.  You are bleeding from the opening between the buttocks (anus).  You have thin, pencil-like stools. Get help right away if:  You have a fever and your symptoms suddenly get worse.  You leak stool or have blood in your stool.  Your abdomen is bloated.  You have severe pain in your abdomen.  You feel dizzy or you faint. Summary  Constipation is when a person has fewer than three bowel movements in a week, has difficulty having a bowel movement, or has stools (feces) that are dry, hard, or larger than normal.  Eat foods that have a lot of fiber, such as beans, whole grains, and fresh fruits and vegetables.  Drink enough fluid to keep your urine pale yellow.  Take over-the-counter and prescription medicines only as told by your health care provider. This includes any fiber supplements. This information  is not intended to replace advice given to you by your health care provider. Make sure you discuss any questions you have with your health care provider. Document Revised: 11/29/2018 Document Reviewed: 11/29/2018 Elsevier Patient Education  2021 [employer].      If you have lab work done today you will be contacted with your lab results within the next 2 weeks.  If you have not heard from [place] then please contact us. The fastest way to get your results is to register for My Chart.   IF you received  an x-ray today, you will receive an invoice from [facility]. Please contact [facility] at [phone] with questions or concerns regarding your invoice.   IF you received labwork today, you will receive an invoice from [facility]. Please contact [facility] at [phone] with questions or concerns regarding your invoice.   Our billing staff will not be able to assist you with questions regarding bills from these companies.  You will be contacted with the lab results as soon as they are available. The fastest way to get your results is to activate your My Chart account. Instructions are located on the last page of this paperwork. If you have not heard from [place] regarding the results in 2 weeks, please contact this office.

## 2020-02-15 NOTE — Telephone Encounter (Signed)
Faxed to [facility]: collagen dressing, kerlix gauze, and medipore tape to be used every other day

## 2020-02-15 NOTE — Progress Notes (Signed)
Virtual Visit via Video Note  I connected with [name] on 02/15/20 at 3:02 PM by a video enabled telemedicine application and verified that I am speaking with the correct person using two identifiers.  Patient location:home  My location: office    I discussed the limitations, risks, security and privacy concerns of performing an evaluation and management service by telephone and the availability of in person appointments. I also discussed with the patient that there may be a patient responsible charge related to this service. The patient expressed understanding and agreed to proceed, consent obtained  Chief complaint:  Chief Complaint  Patient presents with   1 month f/u     Med f/u      History of Present Illness: Arleth Mccullar is a 87 y.o. female  Follow-up from December 16 office visit  Pedal edema Lasix reordered last visit, 10 mg daily.  Intolerant to liquid formulation.  CMP with normal sodium, potassium, electrolytes, stable December 16.  Some right ankle pain at that time, thought to be possibly from neuropathy and swelling. Has been taking lasix 1/2 pill.  Swelling better, practically gone. Able to put shoes on easier.  R ankle is better. Occasional pain. Stable on tramadol 3 per day.  Denies new side effects on tramadol. Has chronic constipation, but having bowel movements 3 times per week.with colace daily and castor oil at times. Usually hard stool.  Left lower extremity wound Followed by wound care, home bandages. Plastic surgery telemed visit yesterday.   Atrophic vaginitis: Premarin vaginal cream, Refilled on 01/31/20.  Using small amount daily - concerned may be using too much at times.  Has vaseline and periguard.   Referred to orthopedics last visit - has not received call.    Patient Active Problem List   Diagnosis Date Noted   Leg wound, left 10/09/2019   Vaginitis, atrophic 05/02/2019   Need for prophylactic vaccination  against Streptococcus pneumoniae (pneumococcus) 04/24/2019   Vaginitis and vulvovaginitis 04/03/2019   Chronic UTI 04/03/2019   Dysphagia 10/17/2017   Abnormal barium swallow 10/17/2017   Gastroesophageal reflux disease 10/17/2017   OAB (overactive bladder) 09/21/2017   Pharyngeal dysphagia 07/11/2017   Urinary incontinence in female 11/30/2016   Pelvic relaxation 11/30/2016   Dysuria 11/30/2016   Vaginal atrophy 11/30/2016   Essential hypertension 02/06/2014   Thyroid activity decreased 02/06/2014   Edema 05/03/2013   Anemia 10/29/2011   Arthritis 10/29/2011   B12 deficiency 10/29/2011   Gastro-esophageal reflux disease with esophagitis 10/29/2011   Hyperlipidemia 10/29/2011   Seasonal allergies 10/29/2011   Past Medical History:  Diagnosis Date   Anxiety    Bowel obstruction ([place]) 1950   Cancer of septum of nose ([place])    melanoma   Depression    GERD (gastroesophageal reflux disease)    Hypertension    Hypothyroidism    Past Surgical History:  Procedure Laterality Date   APPLICATION OF A-CELL OF EXTREMITY Left 10/17/2019   Procedure: APPLICATION OF INTEGRA BILAYER WOUND MATRIX OF EXTREMITY;  Surgeon: Wallace Going, DO;  Location: [place];  Service: Plastics;  Laterality: Left;   BIOPSY  12/07/2017   Procedure: BIOPSY;  Surgeon: Rush Landmark Telford Nab., MD;  Location: [facility];  Service: Gastroenterology;;   CHOLECYSTECTOMY     ESOPHAGOGASTRODUODENOSCOPY (EGD) WITH PROPOFOL N/A 12/07/2017   Procedure: ESOPHAGOGASTRODUODENOSCOPY (EGD) WITH PROPOFOL;  Surgeon: Irving Copas., MD;  Location: [facility];  Service: Gastroenterology;  Laterality: N/A;  May need Dilation   hysterecrtomy  I & D EXTREMITY Left 10/17/2019   Procedure: IRRIGATION AND DEBRIDEMENT EXTREMITY;  Surgeon: Wallace Going, DO;  Location: [place];  Service: Plastics;  Laterality: Left;  45 min   left hip  surgery     ROTATOR CUFF REPAIR Right    SMALL INTESTINE SURGERY     Allergies  Allergen Reactions   Brimonidine    Codeine Other (See Comments)   Levofloxacin Other (See Comments)   Lidocaine    Meloxicam    Oxycodone    Pregabalin    Sulfa Antibiotics Other (See Comments)   Timolol Maleate    Zolpidem    Gabapentin Rash    Pt did not like the way it made her feel   Prior to Admission medications   Medication Sig Start Date End Date Taking? Authorizing Provider  ALPRAZolam (XANAX) 0.25 MG tablet Take 1 tablet (0.25 mg total) by mouth 3 (three) times daily. 12/11/19   Wendie Agreste, MD  betamethasone valerate ointment (VALISONE) 0.1 % 1 application. 02/28/19   [provider]  cephALEXin (KEFLEX) 125 MG/5ML suspension Take 5 mLs (125 mg total) by mouth daily. 10/18/19   Daleen Squibb, MD  CEQUA 0.09 % SOLN Apply 1 drop to eye 2 (two) times daily. 10/10/18   [provider]  cetirizine (ZYRTEC) 10 MG tablet TAKE 1 TABLET BY MOUTH EVERY DAY 01/27/20   Wendie Agreste, MD  Cholecalciferol (VITAMIN D3) 50 MCG (2000 UT) TABS Take 2,000 Units by mouth daily.    [provider]  conjugated estrogens (PREMARIN) vaginal cream Place 0.5 Applicatorfuls vaginally 2 (two) times a week. 01/31/20   Wendie Agreste, MD  docusate sodium (COLACE) 100 MG capsule Take 100 mg by mouth 2 (two) times daily as needed (for constipation).  07/13/16   [provider]  DULoxetine (CYMBALTA) 20 MG capsule TAKE 1 CAPSULE BY MOUTH EVERY DAY 01/27/20   Wendie Agreste, MD  ferrous sulfate 325 (65 FE) MG EC tablet Take by mouth.    [provider]  FLUAD QUADRIVALENT 0.5 ML injection  12/22/18   [provider]  furosemide (LASIX) 20 MG tablet Take 0.5 tablets (10 mg total) by mouth daily. 01/10/20   Wendie Agreste, MD  Ginkgo Biloba 120 MG CAPS Take 120 mg by mouth daily.    [provider]  Lactobacillus (PROBIOTIC ACIDOPHILUS  PO) Take 1 capsule by mouth daily.    [provider]  levothyroxine (SYNTHROID) 75 MCG tablet Take 1 tablet (75 mcg total) by mouth daily. 12/11/19   Wendie Agreste, MD  [facility] PAIN RELIEF PATCH EX) Place 1 patch onto the skin daily as needed (for shoulder pain.).    [provider]  loperamide (IMODIUM) 2 MG capsule Take by mouth as needed for diarrhea or loose stools.    [provider]  losartan (COZAAR) 100 MG tablet Take 1 tablet (100 mg total) by mouth daily. 12/24/19   Wendie Agreste, MD  Menthol, Topical Analgesic, 154 MG PADS Place 1 each onto the skin daily as needed (for hip pain.).    [provider]  Multiple Vitamin (MULTIVITAMIN) capsule Take 1 capsule by mouth daily.    [provider]  pantoprazole (PROTONIX) 40 MG tablet Take 1 tablet (40 mg total) by mouth 2 (two) times daily. 05/14/19   Forrest Moron, MD  PROLIA 60 MG/ML SOSY injection Take 60 mg by mouth every 6 (six) months. 09/15/17  [provider]  Pumpkin Seed-Soy Germ (AZO BLADDER CONTROL/GO-LESS PO) Take 1 tablet by mouth daily as needed (for bladder pain.).    [provider]  RESTASIS 0.05 % ophthalmic emulsion  10/18/18   [provider]  silver sulfADIAZINE (SILVADENE) 1 % cream Apply to affected area 6 times daily as needed 03/16/19   [provider]  traMADol (ULTRAM) 50 MG tablet TAKE 1 TABLET BY MOUTH 3 TIMES DAILY AS NEEDED FOR PAIN 01/31/20   Wendie Agreste, MD  trolamine salicylate (ASPERCREME) 10 % cream Apply 1 application topically 3 (three) times daily as needed for muscle pain.    [provider]   Social History   Socioeconomic History   Marital status: Widowed    Spouse name: Not on file   Number of children: 8   Years of education: Not on file   Highest education level: Not on file  Occupational History   Not on file  Tobacco Use   Smoking status: Never Smoker   Smokeless  tobacco: Never Used  Vaping Use   Vaping Use: Never used  Substance and Sexual Activity   Alcohol use: No    Comment: wine/rare   Drug use: No   Sexual activity: Not on file  Other Topics Concern   Not on file  Social History Narrative   Not on file   Social Determinants of Health   Financial Resource Strain: Not on file  Food Insecurity: Not on file  Transportation Needs: Not on file  Physical Activity: Not on file  Stress: Not on file  Social Connections: Not on file  Intimate Partner Violence: Not on file    Observations/Objective: Vitals:   02/15/20 1014  Weight: 110 lb (49.9 kg)  Height: 4\' 11"  (1.499 m)  No distress on video, appropriate responses.  Euthymic mood.  Normal speech.  All questions were answered, understanding expressed.  Questions with family member present on video also answered.   Assessment and Plan: Constipation, unspecified constipation type  -Continue stool softener, can also add low-dose of fiber supplement with option of MiraLAX as well.  RTC precautions.  Chronic right shoulder pain   -Phone number provided to previous orthopedic office referral.  Right ankle pain, unspecified chronicity  -Improved with decrease in pedal edema.  No change in pain regimen for now but constipation precautions discussed with tramadol, treatment as above.  Pedal edema  -Improved, intermittent dosing option of her 10 mg furosemide but can return to daily dose if edema returns.  Atrophic vaginitis  -Dosing instructions discussed with options for the Premarin cream as well as temporary use of over-the-counter hydrocortisone if needed.  Did recommend follow-up with GYN if persistent or worsening symptoms.   Follow Up Instructions:   3 months.  I discussed the assessment and treatment plan with the patient. The patient was provided an opportunity to ask questions and all were answered. The patient agreed with the plan and demonstrated an understanding of  the instructions.   The patient was advised to call back or seek an in-person evaluation if the symptoms worsen or if the condition fails to improve as anticipated.  I provided 30 minutes of non-face-to-face time during this encounter.   Wendie Agreste, MD

## 2020-02-15 NOTE — Progress Notes (Signed)
Called pt and sch appt for 05/15/20

## 2020-02-18 DIAGNOSIS — S81802A Unspecified open wound, left lower leg, initial encounter: Secondary | ICD-10-CM | POA: Diagnosis not present

## 2020-02-19 ENCOUNTER — Telehealth: Payer: Self-pay | Admitting: Family Medicine

## 2020-02-19 ENCOUNTER — Other Ambulatory Visit: Payer: Self-pay

## 2020-02-19 MED ORDER — CEPHALEXIN 125 MG/5ML PO SUSR: 125.0000 mg | Freq: Every day | ORAL | 11 refills | Status: DC

## 2020-02-19 NOTE — Telephone Encounter (Signed)
   Notes to clinic:  Pharmacy didn't receive script from 10/18/2019  Please resend   Requested Prescriptions  Pending Prescriptions Disp Refills   cephALEXin (KEFLEX) 125 MG/5ML suspension 100 mL 11    Sig: Take 5 mLs (125 mg total) by mouth daily.      There is no refill protocol information for this order

## 2020-02-19 NOTE — Telephone Encounter (Signed)
Copied from [place] [id number]. Topic: Quick Communication - Rx Refill/Question >> Feb 19, 2020 10:45 AM Yvette Rack wrote: Medication: cephALEXin (KEFLEX) 125 MG/5ML suspension  Has the patient contacted their pharmacy? yes - Pt told to provider  Preferred Pharmacy (with phone number or street name): [facility] - [place], [place] - [address]  Phone: [phone]   Fax: [phone]  Agent: Please be advised that RX refills may take up to 3 business days. We ask that you follow-up with your pharmacy.

## 2020-02-21 ENCOUNTER — Encounter: Payer: Self-pay | Admitting: Family Medicine

## 2020-02-21 ENCOUNTER — Telehealth: Payer: Self-pay | Admitting: Family Medicine

## 2020-02-21 ENCOUNTER — Other Ambulatory Visit: Payer: Self-pay

## 2020-02-21 ENCOUNTER — Other Ambulatory Visit: Payer: Self-pay | Admitting: Family Medicine

## 2020-02-21 MED ORDER — CEPHALEXIN 125 MG/5ML PO SUSR: 125.0000 mg | Freq: Every day | ORAL | 11 refills | Status: DC

## 2020-02-21 NOTE — Telephone Encounter (Signed)
Copied from [address] [id number]. Topic: Quick Communication - Rx Refill/Question >> Feb 21, 2020  3:19 PM Tessa Lerner A wrote: Medication: cephALEXin (KEFLEX) 125 MG/5ML   Has the patient contacted their pharmacy? - pharmacy made contact on behalf of patient  Preferred Pharmacy (with phone number or street name): [facility] - [place], [place] - [address]  Phone:  [phone]  Agent: Please be advised that RX refills may take up to 3 business days. We ask that you follow-up with your pharmacy.

## 2020-02-21 NOTE — Telephone Encounter (Signed)
Called pharmacy verified they did not receive Rx from 02/19/2020 and have re sent today

## 2020-02-21 NOTE — Telephone Encounter (Signed)
Latoya from pharmacy called for provider to authorize refill of   cephALEXin [facility]) 125 MG/5ML suspension [[id number]]  Pharmacy:   [facility] [place], [place] - [address]  [address] [place] [address]  Phone:  [phone] Fax:  [phone]   Patient out of medication as of yesterday. Didn't have dose for today. Please advise at [phone]

## 2020-02-22 ENCOUNTER — Telehealth: Payer: Self-pay | Admitting: Surgical

## 2020-02-22 NOTE — Telephone Encounter (Signed)
Patient's granddaughter, [name], called to request adaptic and [facility]'s equivalent of [place] jelly. They need a new prescription to be sent in. Please call [name] to advise.

## 2020-02-22 NOTE — Telephone Encounter (Signed)
Called and LMOM @ 3:15pm) regarding the message below.//AB/CMA

## 2020-02-25 DIAGNOSIS — S81802A Unspecified open wound, left lower leg, initial encounter: Secondary | ICD-10-CM | POA: Diagnosis not present

## 2020-02-25 NOTE — Telephone Encounter (Signed)
Called on (02/22/20) and spoke with the patient's granddaughter Kayla Lloyd) regarding the message below.  She stated that she call [facility] regarding reordering supplies for the patient.  She was informed by [facility] that they will need a new orders for the supplies for the Adaptic and [place].  Informed the granddaughter that a new order was sent in on (02/04/18) for the Adaptic.  She asked if I would resend the order.    Informed her that she can get the [facility] over the counter.  She stated that it would be better if they could get it from [facility] as well.    Informed the granddaughter that I will give [facility] a call to see what they need.  The granddaughter verbalized understanding and agreed.  Called [facility] and was informed that they did not receive a order for the Adaptic or Surgilube.  I was asked to resend the order.  Resent the order for the Adaptic and Surgilube.  Confirmation received, and copy scanned into the chart.//AB/CMA

## 2020-02-25 NOTE — Telephone Encounter (Signed)
Called and spoke with the patient and asked to speak with her granddaughter.  She stated that her granddaughter has left for work.   Informed her that I spoke with [facility] regarding her supplies, and was informed that they did not receive the order.  So they asked me to resend the order.    Informed the patient that the order was resent and [facility] received it, and she should received the supplies in 1-2 days.  Patient verbalized understanding and agreed.  Asked the patient to let her granddaughter know.  She stated that she will.//AB/CMA

## 2020-02-25 NOTE — Telephone Encounter (Signed)
[facility] was called regarding the patient's supplies.//AB/CMA

## 2020-02-27 DIAGNOSIS — M25512 Pain in left shoulder: Secondary | ICD-10-CM | POA: Diagnosis not present

## 2020-02-27 DIAGNOSIS — M19011 Primary osteoarthritis, right shoulder: Secondary | ICD-10-CM | POA: Diagnosis not present

## 2020-02-27 DIAGNOSIS — M19012 Primary osteoarthritis, left shoulder: Secondary | ICD-10-CM | POA: Diagnosis not present

## 2020-02-27 DIAGNOSIS — M25511 Pain in right shoulder: Secondary | ICD-10-CM | POA: Diagnosis not present

## 2020-03-03 ENCOUNTER — Encounter: Payer: Self-pay | Admitting: Family Medicine

## 2020-03-03 ENCOUNTER — Telehealth: Payer: Self-pay | Admitting: *Deleted

## 2020-03-03 ENCOUNTER — Encounter: Payer: Self-pay | Admitting: Plastic Surgery

## 2020-03-03 NOTE — Telephone Encounter (Signed)
Faxed order to [facility] on (02/22/20 and 02/24/20) for medical supplies for the patient.  Supplies:Adaptic                Surgilube   Confirmation received and copy scanned into the chart.//AB/CMA

## 2020-03-03 NOTE — Telephone Encounter (Signed)
Received Order Status Notification on (02/25/20) from [facility].  Stating:[facility] contacted the patient with pricing options, as their health plan does not cover (Surgilube).  We shipped the covered items and the patient declined to purchase the non-covered items at this time.//AB/CMA

## 2020-03-05 DIAGNOSIS — H53413 Scotoma involving central area, bilateral: Secondary | ICD-10-CM | POA: Diagnosis not present

## 2020-03-06 ENCOUNTER — Telehealth (INDEPENDENT_AMBULATORY_CARE_PROVIDER_SITE_OTHER): Payer: Medicare HMO | Admitting: Family Medicine

## 2020-03-06 DIAGNOSIS — E039 Hypothyroidism, unspecified: Secondary | ICD-10-CM

## 2020-03-06 DIAGNOSIS — G894 Chronic pain syndrome: Secondary | ICD-10-CM

## 2020-03-06 DIAGNOSIS — M2550 Pain in unspecified joint: Secondary | ICD-10-CM | POA: Diagnosis not present

## 2020-03-06 MED ORDER — DULOXETINE HCL 30 MG PO CPEP: 30.0000 mg | ORAL_CAPSULE | Freq: Every day | ORAL | 5 refills | Status: DC

## 2020-03-06 MED ORDER — LEVOTHYROXINE SODIUM 75 MCG PO TABS: 75.0000 ug | ORAL_TABLET | Freq: Every day | ORAL | 1 refills | Status: DC

## 2020-03-06 NOTE — Patient Instructions (Addendum)
For pain -  Try increasing cymbalta to 30mg  per day. You can take acetaminophen daily up to 4  times per day, can decrease the tramadol to as needed. Higher dose of cymbalta may also lessen effect of tramadol. Do not take tramadol any more frequently than every 8 hours.  I would try to minimize aspirin use to minimize risk of bleeding. Can try aspirin 81 mg on occasion if that provides significant relief of your pain, but would watch for new bleeding on that medicine.     If you have lab work done today you will be contacted with your lab results within the next 2 weeks.  If you have not heard from [place] then please contact us. The fastest way to get your results is to register for My Chart.   IF you received an x-ray today, you will receive an invoice from [facility]. Please contact [facility] at [phone] with questions or concerns regarding your invoice.   IF you received labwork today, you will receive an invoice from [facility]. Please contact [facility] at [phone] with questions or concerns regarding your invoice.   Our billing staff will not be able to assist you with questions regarding bills from these companies.  You will be contacted with the lab results as soon as they are available. The fastest way to get your results is to activate your My Chart account. Instructions are located on the last page of this paperwork. If you have not heard from [place] regarding the results in 2 weeks, please contact this office.

## 2020-03-06 NOTE — Progress Notes (Signed)
Virtual Visit via audio note Called at 5:40- in restroom - asked that I call back a 5:55.  Trouble connecting on video. Changed to phone call.  I connected with [name] on 03/06/20 at 5:54 PM by audio and verified that I am speaking with the correct person using two identifiers.  Patient location:home with granddaughter Clarene Critchley.  My location: office   I discussed the limitations, risks, security and privacy concerns of performing an evaluation and management service by telephone and the availability of in person appointments. I also discussed with the patient that there may be a patient responsible charge related to this service. The patient expressed understanding and agreed to proceed, consent obtained.   Chief complaint:  Chief Complaint  Patient presents with  . Arthritis    Pt reports all over arthritis pain, had a fall a while ago and had a shoulder pain since them even after reparative surgery. Pt asking for something for relief  as pt reports tramadol not working like it used to     History of Present Illness: Joei Frangos is a 87 y.o. female  10 minutes chart review.   Concern as above. Has arthritis in hips, neck, shoulder feet, ankle, leg wound.   Feels like the only thing that can help her pain is Goody's powder, or BC powder. felt like it helped her shoulder, her bone pains. Feels like aspirin works better for her pain - only taking 1 Goody powder per day.   Has been taking small amount of Goody Powder on end of spoon twice per day- 1/4 of dose - helps pain - gets through the day. Feels like able to move R arm better, and not getting headaches with using Goody powder.   Family is concerned about her bleeding more on this medicine. Clarene Critchley on call - states that vein specialist told her that aspirin may be cause of wound. Clarene Critchley also concerned about fall and bleeding.   Per Geni Bers, she does not feel like tramadol helps at all with her pain.  Has not felt any different with use of tramadol or off tramadol. Per [name], sometimes takes too close together and makes feel woozy.   Taking duloxetine 20mg  qd. Pain in heels still wakes her up at times at night.   Lab Results  Component Value Date   WBC 7.0 06/26/2019   HGB 13.9 06/26/2019   HCT 42.1 06/26/2019   MCV 95 06/26/2019   PLT 185 06/26/2019   History of chronic back and shoulder pain discussed in November 2021, taking tramadol 3 times per day at that time.  With her chronic right shoulder pain had been seen by orthopedist locally - requested a different orthopedist evaluation when we discussed this in December, referred to Dr. Rhona Raider at [facility], had visit - and cortisone shot - up to every 3 months.   Constipation treatment discussed at her last virtual visit January 21 with use of daily tramadol.  Has been under the care of plastic surgery, wound care with home wound care for difficult healing left leg wound.  Did report some improvement in her ankle pain at her January visit, stable on tramadol 3 times per day at that time.  History of neuropathy in feet - treated with Cymbalta as she did not tolerate gabapentin(caused rash)  MyChart message noted from February 7.  Concern about use of tramadol, Ms. Degidio preferred to take Goody powder but there was concern about her using aspirin and thinning of  her blood.   Patient Active Problem List   Diagnosis Date Noted  . Leg wound, left 10/09/2019  . Vaginitis, atrophic 05/02/2019  . Need for prophylactic vaccination against Streptococcus pneumoniae (pneumococcus) 04/24/2019  . Vaginitis and vulvovaginitis 04/03/2019  . Chronic UTI 04/03/2019  . Dysphagia 10/17/2017  . Abnormal barium swallow 10/17/2017  . Gastroesophageal reflux disease 10/17/2017  . OAB (overactive bladder) 09/21/2017  . Pharyngeal dysphagia 07/11/2017  . Urinary incontinence in female 11/30/2016  . Pelvic relaxation 11/30/2016  . Dysuria  11/30/2016  . Vaginal atrophy 11/30/2016  . Essential hypertension 02/06/2014  . Thyroid activity decreased 02/06/2014  . Edema 05/03/2013  . Anemia 10/29/2011  . Arthritis 10/29/2011  . B12 deficiency 10/29/2011  . Gastro-esophageal reflux disease with esophagitis 10/29/2011  . Hyperlipidemia 10/29/2011  . Seasonal allergies 10/29/2011   Past Medical History:  Diagnosis Date  . Anxiety   . Arthritis    Phreesia 03/05/2020  . Bowel obstruction ([facility]) 1950  . Cancer of septum of nose (HCC)    melanoma  . Depression   . GERD (gastroesophageal reflux disease)   . Hypertension   . Hypothyroidism   . Osteoporosis    Phreesia 03/05/2020   Past Surgical History:  Procedure Laterality Date  . ABDOMINAL HYSTERECTOMY N/A    Phreesia 03/05/2020  . APPLICATION OF A-CELL OF EXTREMITY Left 10/17/2019   Procedure: APPLICATION OF INTEGRA BILAYER WOUND MATRIX OF EXTREMITY;  Surgeon: Wallace Going, DO;  Location: [facility];  Service: Plastics;  Laterality: Left;  . BIOPSY  12/07/2017   Procedure: BIOPSY;  Surgeon: Rush Landmark Telford Nab., MD;  Location: [facility];  Service: Gastroenterology;;  . CHOLECYSTECTOMY    . ESOPHAGOGASTRODUODENOSCOPY (EGD) WITH PROPOFOL N/A 12/07/2017   Procedure: ESOPHAGOGASTRODUODENOSCOPY (EGD) WITH PROPOFOL;  Surgeon: Rush Landmark Telford Nab., MD;  Location: [facility];  Service: Gastroenterology;  Laterality: N/A;  May need Dilation  . HERNIA REPAIR N/A    Phreesia 03/05/2020  . hysterecrtomy    . I & D EXTREMITY Left 10/17/2019   Procedure: IRRIGATION AND DEBRIDEMENT EXTREMITY;  Surgeon: Wallace Going, DO;  Location: [facility];  Service: Plastics;  Laterality: Left;  45 min  . JOINT REPLACEMENT N/A    Phreesia 03/05/2020  . left hip surgery    . ROTATOR CUFF REPAIR Right   . SMALL INTESTINE SURGERY     Allergies  Allergen Reactions  . Brimonidine   . Codeine Other (See Comments)  . Levofloxacin Other (See  Comments)  . Lidocaine   . Meloxicam   . Oxycodone   . Pregabalin   . Sulfa Antibiotics Other (See Comments)  . Timolol Maleate   . Zolpidem   . Gabapentin Rash    Pt did not like the way it made her feel   Prior to Admission medications   Medication Sig Start Date End Date Taking? Authorizing Provider  ALPRAZolam (XANAX) 0.25 MG tablet Take 1 tablet (0.25 mg total) by mouth 3 (three) times daily. 12/11/19  Yes Wendie Agreste, MD  betamethasone valerate ointment (VALISONE) 0.1 % 1 application. 02/28/19  Yes [provider]  cephALEXin (KEFLEX) 125 MG/5ML suspension Take 5 mLs (125 mg total) by mouth daily. 02/21/20  Yes Wendie Agreste, MD  CEQUA 0.09 % SOLN Apply 1 drop to eye 2 (two) times daily. 10/10/18  Yes [provider]  cetirizine (ZYRTEC) 10 MG tablet TAKE 1 TABLET BY MOUTH EVERY DAY 01/27/20  Yes Wendie Agreste, MD  Cholecalciferol (VITAMIN  D3) 50 MCG (2000 UT) TABS Take 2,000 Units by mouth daily.   Yes [provider]  conjugated estrogens (PREMARIN) vaginal cream Place 0.5 Applicatorfuls vaginally 2 (two) times a week. 01/31/20  Yes Wendie Agreste, MD  docusate sodium (COLACE) 100 MG capsule Take 100 mg by mouth 2 (two) times daily as needed (for constipation).  07/13/16  Yes [provider]  DULoxetine (CYMBALTA) 20 MG capsule TAKE 1 CAPSULE BY MOUTH EVERY DAY 01/27/20  Yes Wendie Agreste, MD  ferrous sulfate 325 (65 FE) MG EC tablet Take by mouth.   Yes [provider]  FLUAD QUADRIVALENT 0.5 ML injection  12/22/18  Yes [provider]  furosemide (LASIX) 20 MG tablet Take 0.5 tablets (10 mg total) by mouth daily. 01/10/20  Yes Wendie Agreste, MD  Ginkgo Biloba 120 MG CAPS Take 120 mg by mouth daily.   Yes [provider]  Lactobacillus (PROBIOTIC ACIDOPHILUS PO) Take 1 capsule by mouth daily.   Yes [provider]  levothyroxine (SYNTHROID) 75 MCG tablet Take 1 tablet (75 mcg total) by mouth  daily. 12/11/19  Yes Wendie Agreste, MD  Liniments [facility] PAIN RELIEF PATCH EX) Place 1 patch onto the skin daily as needed (for shoulder pain.).   Yes [provider]  loperamide (IMODIUM) 2 MG capsule Take by mouth as needed for diarrhea or loose stools.   Yes [provider]  losartan (COZAAR) 100 MG tablet Take 1 tablet (100 mg total) by mouth daily. 12/24/19  Yes Wendie Agreste, MD  Menthol, Topical Analgesic, 154 MG PADS Place 1 each onto the skin daily as needed (for hip pain.).   Yes [provider]  Multiple Vitamin (MULTIVITAMIN) capsule Take 1 capsule by mouth daily.   Yes [provider]  pantoprazole (PROTONIX) 40 MG tablet Take 1 tablet (40 mg total) by mouth 2 (two) times daily. 05/14/19  Yes Forrest Moron, MD  PROLIA 60 MG/ML SOSY injection Take 60 mg by mouth every 6 (six) months. 09/15/17  Yes [provider]  Pumpkin Seed-Soy Germ (AZO BLADDER CONTROL/GO-LESS PO) Take 1 tablet by mouth daily as needed (for bladder pain.).   Yes [provider]  RESTASIS 0.05 % ophthalmic emulsion  10/18/18  Yes [provider]  silver sulfADIAZINE (SILVADENE) 1 % cream Apply to affected area 6 times daily as needed 03/16/19  Yes [provider]  traMADol (ULTRAM) 50 MG tablet TAKE 1 TABLET BY MOUTH 3 TIMES DAILY AS NEEDED FOR PAIN 01/31/20  Yes Wendie Agreste, MD  trolamine salicylate (ASPERCREME) 10 % cream Apply 1 application topically 3 (three) times daily as needed for muscle pain.   Yes [provider]   Social History   Socioeconomic History  . Marital status: Widowed    Spouse name: Not on file  . Number of children: 8  . Years of education: Not on file  . Highest education level: Not on file  Occupational History  . Not on file  Tobacco Use  . Smoking status: Never Smoker  . Smokeless tobacco: Never Used  Vaping Use  . Vaping Use: Never used  Substance and Sexual Activity  . Alcohol use:  No    Comment: wine/rare  . Drug use: No  . Sexual activity: Not on file  Other Topics Concern  . Not on file  Social History Narrative  . Not on file   Social Determinants of Health   Financial Resource Strain: Not on  file  Food Insecurity: Not on file  Transportation Needs: Not on file  Physical Activity: Not on file  Stress: Not on file  Social Connections: Not on file  Intimate Partner Violence: Not on file    Observations/Objective: There were no vitals filed for this visit.  Speaking in full sentences on phone, no distress, all questions were answered.  Additionally all questions were answered with family member [name].  Assessment and Plan: Polyarthralgia  Acquired hypothyroidism  Chronic pain syndrome  Chronic pain syndrome with diffuse areas of arthritis.  Chronic shoulder pain.  Has received injection as above.  Currently on Cymbalta 20 mg daily, minimal effective tramadol discussed at this time, which is different than information provided on prior visits.  She is also noted more relief with Goody's powder than tramadol but we have discussed risk of aspirin and bleeding.  -For now we will try higher dose of Cymbalta at 30 mg daily  -Tylenol up to 4 times per day over-the-counter  -Minimize tramadol use, and avoid use if no change in symptoms.  Also discussed avoiding using that medication more frequently than 8 hours. Cymbalta may also lessen the effect of tramadol.   -Risk versus benefits of aspirin were discussed. Low dose aspirin 81 mg is an option if that has provided significant relief with the understanding that it can increase risk of bleeding.  Understanding was expressed.  -Recheck 1 month.  Thyroid meds refilled, labs checked recently.  Follow Up Instructions: 1 month  Patient Instructions   For pain -  Try increasing cymbalta to 30mg  per day. You can take acetaminophen daily up to 4  times per day, can decrease the tramadol to as needed. Higher dose  of cymbalta may also lessen effect of tramadol. Do not take tramadol any more frequently than every 8 hours.  I would try to minimize aspirin use to minimize risk of bleeding. Can try aspirin 81 mg on occasion if that provides significant relief of your pain, but would watch for new bleeding on that medicine.     If you have lab work done today you will be contacted with your lab results within the next 2 weeks.  If you have not heard from [place] then please contact us. The fastest way to get your results is to register for My Chart.   IF you received an x-ray today, you will receive an invoice from [facility]. Please contact [facility] at [phone] with questions or concerns regarding your invoice.   IF you received labwork today, you will receive an invoice from [facility]. Please contact [facility] at [phone] with questions or concerns regarding your invoice.   Our billing staff will not be able to assist you with questions regarding bills from these companies.  You will be contacted with the lab results as soon as they are available. The fastest way to get your results is to activate your My Chart account. Instructions are located on the last page of this paperwork. If you have not heard from [place] regarding the results in 2 weeks, please contact this office.         I discussed the assessment and treatment plan with the patient. The patient was provided an opportunity to ask questions and all were answered. The patient agreed with the plan and demonstrated an understanding of the instructions.   The patient was advised to call back or seek an in-person evaluation if the symptoms worsen or if the condition fails to improve as anticipated.  I provided 48 minutes of non-face-to-face time during this encounter.   Wendie Agreste, MD

## 2020-03-12 ENCOUNTER — Encounter: Payer: Self-pay | Admitting: Surgical

## 2020-03-12 ENCOUNTER — Ambulatory Visit (INDEPENDENT_AMBULATORY_CARE_PROVIDER_SITE_OTHER): Payer: Medicare HMO | Admitting: Surgical

## 2020-03-12 ENCOUNTER — Other Ambulatory Visit: Payer: Self-pay

## 2020-03-12 VITALS — BP 111/66 | HR 87

## 2020-03-12 DIAGNOSIS — S81802D Unspecified open wound, left lower leg, subsequent encounter: Secondary | ICD-10-CM

## 2020-03-12 DIAGNOSIS — Z03818 Encounter for observation for suspected exposure to other biological agents ruled out: Secondary | ICD-10-CM | POA: Diagnosis not present

## 2020-03-12 MED ORDER — DOXYCYCLINE HYCLATE 100 MG PO TABS: 100.0000 mg | ORAL_TABLET | Freq: Two times a day (BID) | ORAL | 0 refills | Status: AC

## 2020-03-12 NOTE — Addendum Note (Signed)
Addended byRoetta Sessions on: 03/12/2020 07:29 PM   Modules accepted: Orders

## 2020-03-12 NOTE — Progress Notes (Signed)
   Referring Provider Wendie Agreste, MD [address] [place],  [place] [address]   CC:  Chief Complaint  Patient presents with  . Follow-up      Kayla Lloyd is an 87 y.o. female.  HPI: Patient is a 87 year old female who presents today with her daughter for evaluation of her left lower extremity wound after trauma and hematoma.  She did undergo excision of the left leg wound with placement of Integra on 10/17/2019 with Dr. Marla Roe and we have been doing local wound care for treatment of this.  They have been doing collagen dressing changes daily, they have had some concerns about the foul odor from her wound as well as the increased drainage.  She reports otherwise she feels as if she is doing well.  She is frustrated that she has had this wound for so long, but she also reports that she has noticed some progress.   Review of Systems General: No fevers, chills  Physical Exam Vitals with BMI 03/12/2020 02/15/2020 01/14/2020  Height - 4\' 11"  -  Weight - 110 lbs -  BMI - 30.16 -  Systolic 010 - 932  Diastolic 66 - 65  Pulse 87 - 62    General:  No acute distress,  Alert and oriented, Non-Toxic, Normal speech and affect.  Left lower extremity wound with good base of granulation tissue.  There is some surrounding erythema at the wound edges, there is no cellulitic changes.  She has some swelling.  There is no significant tenderness with palpation.  No purulence is noted.  It is not excessively warm to touch.  The wound is approximately 5 x 3 cm with some new epithelium noted at the edges.  Assessment/Plan  We discussed options for changing wound care from collagen to alginate dressing changes due to the increased drainage noted.  At this time, we will hold off on transitioning to alginate dressing changes and continue with collagen dressing changes.   I do not see any sign of overt infection, however given the redness surrounding her lower extremity, will prescribe a  short course of antibiotics for prophylactic treatment.  She is on chronic Keflex for treatment of chronic UTIs.  She takes this daily.  We will also order some new supplies for her from [facility].  Would like to see the patient back in 2 to 3 weeks for reevaluation.  If she has not made much progress at that time, we will transition to using alginate dressing changes.  Pictures were obtained of the patient and placed in the chart with the patient's or guardian's permission.   Kayla Lloyd 03/12/2020, 2:53 PM

## 2020-03-13 ENCOUNTER — Encounter: Payer: Self-pay | Admitting: Family Medicine

## 2020-03-19 ENCOUNTER — Encounter: Payer: Self-pay | Admitting: Family Medicine

## 2020-03-20 DIAGNOSIS — S81802A Unspecified open wound, left lower leg, initial encounter: Secondary | ICD-10-CM | POA: Diagnosis not present

## 2020-03-25 ENCOUNTER — Other Ambulatory Visit: Payer: Self-pay | Admitting: Family Medicine

## 2020-03-27 ENCOUNTER — Encounter: Payer: Self-pay | Admitting: Family Medicine

## 2020-03-27 ENCOUNTER — Telehealth: Payer: Self-pay | Admitting: Family Medicine

## 2020-03-27 DIAGNOSIS — N390 Urinary tract infection, site not specified: Secondary | ICD-10-CM

## 2020-03-27 MED ORDER — CEPHALEXIN 125 MG/5ML PO SUSR: 125.0000 mg | Freq: Every day | ORAL | 11 refills | Status: DC

## 2020-03-27 MED ORDER — CEPHALEXIN 250 MG/5ML PO SUSR
500.0000 mg | Freq: Two times a day (BID) | ORAL | 0 refills | Status: AC
Start: 2020-03-27 — End: 2020-04-03

## 2020-03-27 NOTE — Telephone Encounter (Signed)
What is the name of the medication? A script for a UTI.   Have you contacted your pharmacy to request a refill? Please check  today's pt message 03/27/20 for all details.   Which pharmacy would you like this sent to? [facility] at [address]   Patient notified that their request is being sent to the clinical staff for review and that they should receive a call once it is complete. If they do not receive a call within 72 hours they can check with their pharmacy or our office.

## 2020-03-27 NOTE — Telephone Encounter (Signed)
Test reviewed with positive leukocyte esterase, possible positive nitrite - borderline based on photo. Prior phone notes reviewed. Called Kayla Lloyd - provided history as well as Kayla Lloyd.  On low dose keflex for prevention of UTI. 125mg  QD. Has noticed burning with urination. No fever past few days. Tuesday night symptoms started. No blood seen. No vomiting. No abd pain. Last dose keflex 2 days ago. Took azo past few days. Still with burning with urination today.   Will Rx high-dose Keflex at 500 mg twice daily for the next 7 days, sent to [facility] to be able to pick up tonight.  After 1 week can revert back to her daily 125 mg dose for UTI prevention.  ER/urgent care precautions discussed over the weekend if any worsening symptoms including fever, nausea, vomiting, abdominal pain, or symptoms are not improving does need to be seen in person for further testing.  Understanding was expressed.     20 min call.

## 2020-03-27 NOTE — Telephone Encounter (Signed)
2 messages have already been sent to the provider he will respond when he is able.

## 2020-03-27 NOTE — Telephone Encounter (Signed)
Pt prone to UTI pt caretaker wishes for [place] to just send abx without visit. Last in office was December.  Please advise

## 2020-04-03 ENCOUNTER — Encounter: Payer: Self-pay | Admitting: Family Medicine

## 2020-04-04 ENCOUNTER — Ambulatory Visit: Payer: Medicare HMO | Admitting: Surgical

## 2020-04-04 DIAGNOSIS — B373 Candidiasis of vulva and vagina: Secondary | ICD-10-CM | POA: Diagnosis not present

## 2020-04-04 DIAGNOSIS — R300 Dysuria: Secondary | ICD-10-CM | POA: Diagnosis not present

## 2020-04-04 DIAGNOSIS — Z8619 Personal history of other infectious and parasitic diseases: Secondary | ICD-10-CM | POA: Diagnosis not present

## 2020-04-07 ENCOUNTER — Other Ambulatory Visit: Payer: Self-pay | Admitting: Family Medicine

## 2020-04-07 ENCOUNTER — Encounter: Payer: Self-pay | Admitting: Family Medicine

## 2020-04-07 DIAGNOSIS — K219 Gastro-esophageal reflux disease without esophagitis: Secondary | ICD-10-CM

## 2020-04-07 NOTE — Telephone Encounter (Signed)
Requested medication (s) are due for refill today: no  Requested medication (s) are on the active medication list: yes  Last refill: 04/07/2020  Future visit scheduled:  no  Notes to clinic: this refill cannot be delegated   Requested Prescriptions  Pending Prescriptions Disp Refills   traMADol (ULTRAM) 50 MG tablet [Pharmacy Med Name: tramadol 50 mg tablet] 60 tablet 2    Sig: TAKE 1 TABLET BY MOUTH 3 TIMES DAILY AS NEEDED FOR PAIN      Not Delegated - Analgesics:  Opioid Agonists Failed - 04/07/2020  8:22 AM      Failed - This refill cannot be delegated      Failed - Urine Drug Screen completed in last 360 days      Passed - Valid encounter within last 6 months    Recent Outpatient Visits           1 month ago Polyarthralgia   [facility], Ranell Patrick, MD   1 month ago Constipation, unspecified constipation type   [facility], Ranell Patrick, MD   2 months ago Pedal edema   [facility], Ranell Patrick, MD   3 months ago Wound of left lower extremity, subsequent encounter   [facility], Ranell Patrick, MD   6 months ago Cellulitis of left anterior lower leg   [facility], Lilia Argue, MD

## 2020-04-08 DIAGNOSIS — N9489 Other specified conditions associated with female genital organs and menstrual cycle: Secondary | ICD-10-CM | POA: Diagnosis not present

## 2020-04-08 DIAGNOSIS — K5900 Constipation, unspecified: Secondary | ICD-10-CM | POA: Diagnosis not present

## 2020-04-08 DIAGNOSIS — N3946 Mixed incontinence: Secondary | ICD-10-CM | POA: Diagnosis not present

## 2020-04-08 DIAGNOSIS — N952 Postmenopausal atrophic vaginitis: Secondary | ICD-10-CM | POA: Diagnosis not present

## 2020-04-08 DIAGNOSIS — N819 Female genital prolapse, unspecified: Secondary | ICD-10-CM | POA: Diagnosis not present

## 2020-04-08 MED ORDER — PANTOPRAZOLE SODIUM 40 MG PO TBEC: 40.0000 mg | DELAYED_RELEASE_TABLET | Freq: Two times a day (BID) | ORAL | 0 refills | Status: DC

## 2020-04-08 NOTE — Telephone Encounter (Signed)
Controlled substance database (PDMP) reviewed. No concerns appreciated.  Last filled for #60 on 03/17/20.  Refilled, but discussed other options at 03/06/20 visit.

## 2020-04-08 NOTE — Telephone Encounter (Signed)
Patient is requesting a refill of the following medications: Requested Prescriptions   Pending Prescriptions Disp Refills   traMADol (ULTRAM) 50 MG tablet [Pharmacy Med Name: tramadol 50 mg tablet] 60 tablet 2    Sig: TAKE 1 TABLET BY MOUTH 3 TIMES DAILY AS NEEDED FOR PAIN    Date of patient request: 04/07/20 Last office visit: 03/06/20 Date of last refill: 01/31/20 Last refill amount: 60 x2 Follow up time period per chart:

## 2020-04-11 ENCOUNTER — Ambulatory Visit (INDEPENDENT_AMBULATORY_CARE_PROVIDER_SITE_OTHER): Payer: Medicare HMO | Admitting: Orthopedic Surgery

## 2020-04-11 ENCOUNTER — Encounter: Payer: Self-pay | Admitting: Orthopedic Surgery

## 2020-04-11 ENCOUNTER — Other Ambulatory Visit: Payer: Self-pay

## 2020-04-11 VITALS — BP 110/70 | HR 87 | Temp 97.5°F | Resp 20 | Ht 59.0 in | Wt 110.0 lb

## 2020-04-11 DIAGNOSIS — D508 Other iron deficiency anemias: Secondary | ICD-10-CM

## 2020-04-11 DIAGNOSIS — N3281 Overactive bladder: Secondary | ICD-10-CM

## 2020-04-11 DIAGNOSIS — S81802D Unspecified open wound, left lower leg, subsequent encounter: Secondary | ICD-10-CM

## 2020-04-11 DIAGNOSIS — E538 Deficiency of other specified B group vitamins: Secondary | ICD-10-CM

## 2020-04-11 DIAGNOSIS — J302 Other seasonal allergic rhinitis: Secondary | ICD-10-CM

## 2020-04-11 DIAGNOSIS — I10 Essential (primary) hypertension: Secondary | ICD-10-CM | POA: Diagnosis not present

## 2020-04-11 DIAGNOSIS — N390 Urinary tract infection, site not specified: Secondary | ICD-10-CM | POA: Diagnosis not present

## 2020-04-11 DIAGNOSIS — R630 Anorexia: Secondary | ICD-10-CM

## 2020-04-11 DIAGNOSIS — R1313 Dysphagia, pharyngeal phase: Secondary | ICD-10-CM

## 2020-04-11 DIAGNOSIS — M2550 Pain in unspecified joint: Secondary | ICD-10-CM | POA: Diagnosis not present

## 2020-04-11 DIAGNOSIS — N952 Postmenopausal atrophic vaginitis: Secondary | ICD-10-CM | POA: Diagnosis not present

## 2020-04-11 DIAGNOSIS — E782 Mixed hyperlipidemia: Secondary | ICD-10-CM | POA: Diagnosis not present

## 2020-04-11 DIAGNOSIS — K5901 Slow transit constipation: Secondary | ICD-10-CM | POA: Diagnosis not present

## 2020-04-11 DIAGNOSIS — E46 Unspecified protein-calorie malnutrition: Secondary | ICD-10-CM

## 2020-04-11 DIAGNOSIS — G894 Chronic pain syndrome: Secondary | ICD-10-CM

## 2020-04-11 DIAGNOSIS — N3289 Other specified disorders of bladder: Secondary | ICD-10-CM

## 2020-04-11 DIAGNOSIS — F419 Anxiety disorder, unspecified: Secondary | ICD-10-CM

## 2020-04-11 DIAGNOSIS — E039 Hypothyroidism, unspecified: Secondary | ICD-10-CM

## 2020-04-11 NOTE — Progress Notes (Signed)
Careteam: Patient Care Team: Wendie Agreste, MD as PCP - General (Family Medicine)  Seen by: Windell Moulding, AGNP-C  PLACE OF SERVICE:  [place] Directive information Does Patient Have a Medical Advance Directive?: No (Packet given)  Allergies  Allergen Reactions   Brimonidine    Codeine Other (See Comments)   Levofloxacin Other (See Comments)   Lidocaine    Lipitor [Atorvastatin]    Meloxicam    Myrbetriq [name]]    Oxycodone    Pregabalin    Sulfa Antibiotics Other (See Comments)   Timolol Maleate    Zolpidem    Gabapentin Rash    Pt did not like the way it made her feel    Chief Complaint  Patient presents with   Establish Care    Establish Care     HPI: Patient is a 87 y.o. female seen today to establish at [facility]. Medical records established in [place].   Granddaughter present for encounter.   She is switching PCP due to [place] location closing down. Originally from [place]. She has Lived in [place] for 4 years. Lives in apartment on first floor with daughter. Retired Quarry manager. Had 8 children. Has not driven car in a few years.   Hypothyroid- diagnosed in early 20's. Takes levothyroxine daily on empty stomach. Has some fatigue but thinks it is old age.   High blood pressure- does not know when diagnosed. Takes losartan daily. Tries to follow low sodium diet. Denies chest pain, headache, blurred vision, reports sob with exertion.   Arthritis/Bursitis- complains of chronic right shoulder pain. Has limited mobility. Believed to be related to right rotator cuff in 1995 due to bad fall. Takes tramadol for pain. She will also take Goody powder, does not take full amount. She will also use ice and heat for pain.   Knee/Hip pain- has had chronic bilateral hip pain for years. History of left hip replacement and right knee replacement. Ambulates with cane and walker. No recent falls.   Dysphagia- she reports food gets  caught when taking medicine. Follows a pureed diet. Endoscopy a few years ago, reports healed ulcerations.   Vaginal atrophy- complains of vaginal itching often. Triggered with urination. Described feeling raw or irritated at times. Followed by Dr. Primitivo Gauze. Seen recently for yeast infection. Uses premarin and vaseline.   Bladder spasms- takes pumpkin seed. Believes it helps reduce severity.   Specialists:  Plastic surgery- Dr. Lyndee Leo Dillingham  Podiatrist- Dr. Jacqualyn Posey  Gynecologist- Dr. Primitivo Gauze  Ophthalmologist- [place] [facility]  Urologist- Dr. Hildred Alamin, never smoked.   Drink wine 1-2 times a week.   No present or past drug use.   Recent hospitalizations or injuries.   Eats 2 meals a day. Diet described as mainly fruits, vegetables and lean meats. Does not add salt to foods. Limits sweets. Rarely consumes caffeine drinks, likes to have Green Tea.   Tries to use stationary exercise bike a few times a week. Amount of time and times of week vary.   Sleeps in 4 hour incriments due to bladder.   Eye- next appointment in May. Wears glasses. Left eye watery at times. Also followed by Retina specialist- going next month. Denies changes in vision.   Teeth- dentures. Past history of bleeding gums. Lost teeth in 40's.   Does not do mammograms.   Bone density done in past 2 years.   Advanced directives paperwork packet given. Daughter reports nothing offical /  notarized.   Review of Systems:  Review of Systems  Constitutional: Positive for malaise/fatigue. Negative for chills and fever.  HENT: Positive for hearing loss.        Hearing aid, dentures, seasonal allergies, dry mouth, trouble swallowing  Eyes:       Eye glasses, dry eyes  Respiratory: Positive for cough, sputum production and shortness of breath.   Cardiovascular: Negative for chest pain, palpitations, orthopnea and leg swelling.  Gastrointestinal: Positive for constipation.        Hemorrhoids, flatulence  Genitourinary: Positive for frequency and urgency. Negative for hematuria.       Incontinence  Musculoskeletal: Positive for joint pain and myalgias.  Neurological: Positive for weakness and headaches. Negative for dizziness and seizures.  Endo/Heme/Allergies: Positive for environmental allergies. Negative for polydipsia. Bruises/bleeds easily.  Psychiatric/Behavioral: Positive for depression and memory loss. The patient is nervous/anxious. The patient does not have insomnia.     Past Medical History:  Diagnosis Date   Anxiety    Arthritis    Phreesia 03/05/2020   Bowel obstruction ([place]) 1950   Bursitis    Cancer of septum of nose (HCC)    melanoma   Constipation    At times   Depression    GERD (gastroesophageal reflux disease)    History of open leg wound    Hypertension    Hypothyroidism    Osteoporosis    Phreesia 03/05/2020   Swallowing difficulty    Thyroid activity decreased    Torn rotator cuff    Past Surgical History:  Procedure Laterality Date   ABDOMINAL HYSTERECTOMY N/A    Phreesia 48/25/0037   APPLICATION OF A-CELL OF EXTREMITY Left 10/17/2019   Procedure: APPLICATION OF INTEGRA BILAYER WOUND MATRIX OF EXTREMITY;  Surgeon: Wallace Going, DO;  Location: [facility];  Service: Plastics;  Laterality: Left;   BIOPSY  12/07/2017   Procedure: BIOPSY;  Surgeon: Rush Landmark Telford Nab., MD;  Location: [facility];  Service: Gastroenterology;;   CHOLECYSTECTOMY     ESOPHAGOGASTRODUODENOSCOPY (EGD) WITH PROPOFOL N/A 12/07/2017   Procedure: ESOPHAGOGASTRODUODENOSCOPY (EGD) WITH PROPOFOL;  Surgeon: Irving Copas., MD;  Location: [facility];  Service: Gastroenterology;  Laterality: N/A;  May need Dilation   HERNIA REPAIR N/A    Phreesia 03/05/2020   hysterecrtomy     I & D EXTREMITY Left 10/17/2019   Procedure: IRRIGATION AND DEBRIDEMENT EXTREMITY;  Surgeon: Wallace Going, DO;   Location: [facility];  Service: Plastics;  Laterality: Left;  45 min   JOINT REPLACEMENT N/A    Phreesia 03/05/2020   left hip surgery     ROTATOR CUFF REPAIR Right    SMALL INTESTINE SURGERY     Social History:   reports that she has never smoked. She has never used smokeless tobacco. She reports that she does not drink alcohol and does not use drugs.  Family History  Problem Relation Age of Onset   Uterine cancer Mother    Hypertension Father    Kidney failure Brother    Cancer Son    Cancer Daughter    Colon cancer Neg Hx    Esophageal cancer Neg Hx    Liver disease Neg Hx    Inflammatory bowel disease Neg Hx    Pancreatic cancer Neg Hx    Rectal cancer Neg Hx    Stomach cancer Neg Hx     Medications: Patient's Medications  New Prescriptions   No medications on file  Previous Medications  ALPRAZOLAM (XANAX) 0.25 MG TABLET    Take 1 tablet (0.25 mg total) by mouth 3 (three) times daily.   ASPIRIN-CAFFEINE 845-65 MG PACK    Take 1 Package by mouth daily as needed.   CASTOR OIL LIQUID    Take 15 mLs by mouth as needed.   CETIRIZINE (ZYRTEC) 10 MG TABLET    TAKE 1 TABLET BY MOUTH EVERY DAY   CYANOCOBALAMIN 1000 MCG/ML LIQD    Take 1 tablet by mouth daily in the afternoon.   DOCUSATE SODIUM (COLACE) 100 MG CAPSULE    Take 100 mg by mouth as needed.   ESTROGENS, CONJUGATED (PREMARIN VA)    Place 1 application vaginally daily.   FERROUS SULFATE 325 (65 FE) MG EC TABLET    Take 325 mg by mouth. Every other week   GINKGO BILOBA 120 MG CAPS    Take 120 mg by mouth 2 (two) times a week.   LACTOBACILLUS (PROBIOTIC ACIDOPHILUS PO)    Take 1 capsule by mouth daily.   LEVOTHYROXINE (SYNTHROID) 75 MCG TABLET    Take 1 tablet (75 mcg total) by mouth daily.   LOPERAMIDE (IMODIUM) 2 MG CAPSULE    Take by mouth as needed for diarrhea or loose stools.   LOSARTAN (COZAAR) 100 MG TABLET    Take 1 tablet (100 mg total) by mouth daily.   MAGNESIUM GLUCONATE 27.5  MG TABS    Take 500 mg by mouth daily in the afternoon.   MENTHOL, TOPICAL ANALGESIC, 154 MG PADS    Place 1 each onto the skin daily as needed (for hip pain.).   NON FORMULARY    MV-MIN-Folic Acid-Lutein 742-595 mcg Chew take by mouth   NON FORMULARY    Ubidecarenone-Omega 3-vit E 25-150-200   PANTOPRAZOLE (PROTONIX) 40 MG TABLET    Take 1 tablet (40 mg total) by mouth 2 (two) times daily.   PROLIA 60 MG/ML SOSY INJECTION    Take 60 mg by mouth every 6 (six) months.   PUMPKIN SEED-SOY GERM (AZO BLADDER CONTROL/GO-LESS PO)    Take 1 tablet by mouth daily as needed (for bladder pain.).   TRAMADOL (ULTRAM) 50 MG TABLET    TAKE 1 TABLET BY MOUTH 3 TIMES DAILY as needed for pain   TROLAMINE SALICYLATE (ASPERCREME) 10 % CREAM    Apply 1 application topically as needed for muscle pain.  Modified Medications   No medications on file  Discontinued Medications   CEPHALEXIN (KEFLEX) 125 MG/5ML SUSPENSION    Take 5 mLs (125 mg total) by mouth daily.   CLINDAMYCIN (CLEOCIN) 2 % VAGINAL CREAM    Place 1 Applicatorful vaginally at bedtime. For 7 nights   CONJUGATED ESTROGENS (PREMARIN) VAGINAL CREAM    Place 0.5 Applicatorfuls vaginally 2 (two) times a week.   ESOMEPRAZOLE (NEXIUM) 40 MG CAPSULE    Take 40 mg by mouth 2 (two) times daily.   GABAPENTIN (NEURONTIN) 100 MG CAPSULE    Take 100 mg by mouth.   PHENAZOPYRIDINE (PYRIDIUM) 200 MG TABLET    Take 200 mg by mouth.   SILVER SULFADIAZINE (SILVADENE) 1 % CREAM    Apply to affected area 6 times daily as needed   SIMVASTATIN (ZOCOR) 40 MG TABLET    Take 40 mg by mouth daily.   TERCONAZOLE (TERAZOL 7) 0.4 % VAGINAL CREAM        Physical Exam:  Vitals:   04/11/20 1350  BP: 110/70  Pulse: 87  Resp: 20  Temp: (!) 97.5  F (36.4 C)  TempSrc: Temporal  SpO2: 96%  Height: 4\' 11"  (1.499 m)   Body mass index is 22.22 kg/m. Wt Readings from Last 3 Encounters:  02/15/20 110 lb (49.9 kg)  01/10/20 110 lb (49.9 kg)  12/10/19 111 lb (50.3 kg)     Physical Exam Vitals reviewed.  Constitutional:      General: She is not in acute distress. HENT:     Head: Normocephalic.  Eyes:     General:        Right eye: No discharge.        Left eye: No discharge.     Comments: glasses  Neck:     Vascular: No carotid bruit.  Cardiovascular:     Rate and Rhythm: Normal rate and regular rhythm.     Pulses: Normal pulses.     Heart sounds: Normal heart sounds. No murmur heard.   Pulmonary:     Effort: Pulmonary effort is normal. No respiratory distress.     Breath sounds: Normal breath sounds. No wheezing.  Abdominal:     General: Bowel sounds are normal. There is no distension.     Palpations: Abdomen is soft.     Tenderness: There is no abdominal tenderness.  Musculoskeletal:     Cervical back: Normal range of motion.     Right lower leg: No edema.     Left lower leg: No edema.  Lymphadenopathy:     Cervical: No cervical adenopathy.  Skin:    General: Skin is warm and dry.     Capillary Refill: Capillary refill takes less than 2 seconds.     Findings: Lesion present.     Comments: Left lower extremity wound CDI, granulation tissue present. No drainage. Surrounding skin intact. No odor or tenderness noted.   Neurological:     General: No focal deficit present.     Mental Status: She is alert. Mental status is at baseline.     Motor: Weakness present.     Gait: Gait abnormal.     Comments: Walker, wheelchair  Psychiatric:        Mood and Affect: Mood normal.        Behavior: Behavior normal.        Cognition and Memory: Memory is impaired.     Labs reviewed: Basic Metabolic Panel: Recent Labs    06/26/19 1424 07/17/19 0944 08/07/19 0848 10/17/19 0842 01/10/20 1617  NA 132*   < > 134 130* 135  K 4.3   < > 4.1 3.6 4.9  CL 95*   < > 98 91* 96  CO2 24   < > 24 28 28   GLUCOSE 95   < > 76 99 78  BUN 18   < > 13 12 19   CREATININE 0.73   < > 0.66 0.66 0.68  CALCIUM 9.1   < > 9.0 8.6* 9.4  TSH 0.716  --   --   --   1.100   < > = values in this interval not displayed.   Liver Function Tests: Recent Labs    07/17/19 0944 08/07/19 0848 01/10/20 1617  AST 72* 23 16  ALT 91* 15 9  ALKPHOS 686* 257* 79  BILITOT 0.6 0.6 0.4  PROT 6.2 5.9* 6.5  ALBUMIN 4.3 3.9 4.0   No results for input(s): LIPASE, AMYLASE in the last 8760 hours. No results for input(s): AMMONIA in the last 8760 hours. CBC: Recent Labs    06/26/19 1424  WBC 7.0  HGB 13.9  HCT 42.1  MCV 95  PLT 185   Lipid Panel: Recent Labs    06/26/19 1424  CHOL 252*  HDL 94  LDLCALC 144*  TRIG 84  CHOLHDL 2.7   TSH: Recent Labs    06/26/19 1424 01/10/20 1617  TSH 0.716 1.100   A1C: No results found for: HGBA1C   Assessment/Plan 1. Essential hypertension - bp at goal < 150/90 - cont losartan - cont low sodium diet < 2000 mg /day - cbc/diff- future - bmp- future  2. Mixed hyperlipidemia - LDL 144 9 months ago - she is not on statin due to age - lipid panel yearly - hepatic level- future  3. Vaginitis, atrophic - followed by Dr. Primitivo Gauze - cont premarin and vaseline  4. Chronic UTI - followed by Dr. Shea Evans - states she was on long term Keflex 125mg /38ml for UTI prevention, cannot find encounters for past UTI  5. Pharyngeal dysphagia - cont pureed diet - advised to drink liquids when eating and taking medicine - advised to sit upright- 90 degrees when consuming foods/meds  6. Seasonal allergies - stable with Zyrtec  7. OAB (overactive bladder) - followed by Dr. Shea Evans - tried myrbetriq in past without success-dizziness/blurred vision - advised to f/u in 1 year with urology - advised to stop Good's powder- contains caffeine- bladder irritant  8. Slow transit constipation - ongoing, due to limited mobility  - encourage hydration  9. Polyarthralgia - ongoing, pain in knees and hips - no recent falls - cont falls safety plan - advise to stop Goodys powder and use tylenol 325-650 po Q6 prn  10. Wound of  left lower extremity, subsequent encounter - followed by Dr. Marla Roe - wound slow healing, CDI, no drainage - advise protein supplementations like ensure to promote healing  11. Anxiety - history of being anxious - anxiety increased due to chronic health issues - cont xanax regimen  12. Chronic pain syndrome - chronic pain in knee and hips - mobility limited due to pain - cont Cymbalta and tramadol regimen  13. Bladder spasm - stable with pumpkin seed supplement  14. Acquired hypothyroidism - stable with levothyroxine  15. Iron deficiency anemia secondary to inadequate dietary iron intake - cont ferrous sulfate 325 po daily - no sign of blood loss at this time - iron, TIBC, ferritin  16. B12 deficiency - cont cyanocobalamin 1000 mcg/ml daily - B12 level- future   Total time 58 minutes. 50% of total time spent doing patient counseling and coordination of care on disease processes, fall safety plan, and medications.     Next appt: 05/13/2020 Windell Moulding, [facility] [phone]

## 2020-04-11 NOTE — Patient Instructions (Addendum)
Return in 85-6 weeks for follow up.   Do not take Goody's powder, stick with tylenol.   Preventive Care 85 Years and Older, Female Preventive care refers to lifestyle choices and visits with your health care provider that can promote health and wellness. This includes:  A yearly physical exam. This is also called an annual wellness visit.  Regular dental and eye exams.  Immunizations.  Screening for certain conditions.  Healthy lifestyle choices, such as: ? Eating a healthy diet. ? Getting regular exercise. ? Not using drugs or products that contain nicotine and tobacco. ? Limiting alcohol use. What can I expect for my preventive care visit? Physical exam Your health care provider will check your:  Height and weight. These may be used to calculate your BMI (body mass index). BMI is a measurement that tells if you are at a healthy weight.  Heart rate and blood pressure.  Body temperature.  Skin for abnormal spots. Counseling Your health care provider may ask you questions about your:  Past medical problems.  Family's medical history.  Alcohol, tobacco, and drug use.  Emotional well-being.  Home life and relationship well-being.  Sexual activity.  Diet, exercise, and sleep habits.  History of falls.  Memory and ability to understand (cognition).  Work and work Statistician.  Pregnancy and menstrual history.  Access to firearms. What immunizations do I need? Vaccines are usually given at various ages, according to a schedule. Your health care provider will recommend vaccines for you based on your age, medical history, and lifestyle or other factors, such as travel or where you work.   What tests do I need? Blood tests  Lipid and cholesterol levels. These may be checked every 5 years, or more often depending on your overall health.  Hepatitis C test.  Hepatitis B test. Screening  Lung cancer screening. You may have this screening every year starting at  age 85 if you have a 30-pack-year history of smoking and currently smoke or have quit within the past 15 years.  Colorectal cancer screening. ? All adults should have this screening starting at age 85 and continuing until age 8. ? Your health care provider may recommend screening at age 85 if you are at increased risk. ? You will have tests every 1-10 years, depending on your results and the type of screening test.  Diabetes screening. ? This is done by checking your blood sugar (glucose) after you have not eaten for a while (fasting). ? You may have this done every 1-3 years.  Mammogram. ? This may be done every 1-2 years. ? Talk with your health care provider about how often you should have regular mammograms.  Abdominal aortic aneurysm (AAA) screening. You may need this if you are a current or former smoker.  BRCA-related cancer screening. This may be done if you have a family history of breast, ovarian, tubal, or peritoneal cancers. Other tests  STD (sexually transmitted disease) testing, if you are at risk.  Bone density scan. This is done to screen for osteoporosis. You may have this done starting at age 85. Talk with your health care provider about your test results, treatment options, and if necessary, the need for more tests. Follow these instructions at home: Eating and drinking  Eat a diet that includes fresh fruits and vegetables, whole grains, lean protein, and low-fat dairy products. Limit your intake of foods with high amounts of sugar, saturated fats, and salt.  Take vitamin and mineral supplements as recommended by your  health care provider.  Do not drink alcohol if your health care provider tells you not to drink.  If you drink alcohol: ? Limit how much you have to 0-1 drink a day. ? Be aware of how much alcohol is in your drink. In the [place] one drink equals one 12 oz bottle of beer (355 mL), one 5 oz glass of wine (148 mL), or one 1 oz glass of hard liquor (44  mL).   Lifestyle  Take daily care of your teeth and gums. Brush your teeth every morning and night with fluoride toothpaste. Floss one time each day.  Stay active. Exercise for at least 30 minutes 5 or more days each week.  Do not use any products that contain nicotine or tobacco, such as cigarettes, e-cigarettes, and chewing tobacco. If you need help quitting, ask your health care provider.  Do not use drugs.  If you are sexually active, practice safe sex. Use a condom or other form of protection in order to prevent STIs (sexually transmitted infections).  Talk with your health care provider about taking a low-dose aspirin or statin.  Find healthy ways to cope with stress, such as: ? Meditation, yoga, or listening to music. ? Journaling. ? Talking to a trusted person. ? Spending time with friends and family. Safety  Always wear your seat belt while driving or riding in a vehicle.  Do not drive: ? If you have been drinking alcohol. Do not ride with someone who has been drinking. ? When you are tired or distracted. ? While texting.  Wear a helmet and other protective equipment during sports activities.  If you have firearms in your house, make sure you follow all gun safety procedures. What's next?  Visit your health care provider once a year for an annual wellness visit.  Ask your health care provider how often you should have your eyes and teeth checked.  Stay up to date on all vaccines. This information is not intended to replace advice given to you by your health care provider. Make sure you discuss any questions you have with your health care provider. Document Revised: 01/02/2020 Document Reviewed: 01/05/2018 Elsevier Patient Education  2021 [employer].

## 2020-04-15 ENCOUNTER — Other Ambulatory Visit: Payer: Self-pay | Admitting: Orthopedic Surgery

## 2020-04-15 DIAGNOSIS — R630 Anorexia: Secondary | ICD-10-CM

## 2020-04-15 MED ORDER — PEDIASURE 1.5 CAL PO LIQD: 237.0000 mL | Freq: Two times a day (BID) | ORAL | 6 refills | Status: DC

## 2020-04-15 NOTE — Addendum Note (Signed)
Addended by: Logan Bores on: 04/15/2020 03:37 PM   Modules accepted: Orders

## 2020-04-17 ENCOUNTER — Ambulatory Visit (INDEPENDENT_AMBULATORY_CARE_PROVIDER_SITE_OTHER): Payer: Medicare HMO | Admitting: Podiatry

## 2020-04-17 ENCOUNTER — Encounter: Payer: Self-pay | Admitting: Podiatry

## 2020-04-17 ENCOUNTER — Other Ambulatory Visit: Payer: Self-pay

## 2020-04-17 DIAGNOSIS — M79675 Pain in left toe(s): Secondary | ICD-10-CM

## 2020-04-17 DIAGNOSIS — B351 Tinea unguium: Secondary | ICD-10-CM

## 2020-04-17 DIAGNOSIS — M79674 Pain in right toe(s): Secondary | ICD-10-CM

## 2020-04-17 DIAGNOSIS — G629 Polyneuropathy, unspecified: Secondary | ICD-10-CM | POA: Diagnosis not present

## 2020-04-17 NOTE — Progress Notes (Signed)
   Referring Provider Wendie Agreste, MD [address] [place],  [place] [address]   CC:  Chief Complaint  Patient presents with  . Follow-up      Kayla Lloyd is an 87 y.o. female.  HPI: patient is a 87 year old female here for follow-up on her left lower extremity wound after a trauma/hematoma.  She is here with her family.  They are very pleased with her progress.  She is doing really well.  They have been doing great with dressing changes.  She does not complain of any pain.  They report the smell has improved. They are very happy with our services  Review of Systems General: No fever Skin: No foul odors, positive wound  Physical Exam Vitals with BMI 04/18/2020 04/11/2020 03/12/2020  Height - 4\' 11"  -  Weight - 110 lbs -  BMI - 24.58 -  Systolic [phone]  Diastolic 65 70 66  Pulse 12 87 87    General:  No acute distress,  Alert and oriented, Non-Toxic, Normal speech and affect Left lower extremity wound is 5 x 1.5 cm.  There is a good base of granulation tissue with new epithelialization noted at the wound edges.  She has some chronic surrounding skin changes.  No cellulitic changes.  No foul odor is noted.  2+ DP pulse noted.  No pedal edema noted.      Assessment/Plan  Recommend continue with collagen dressing changes every other day.  We change the dressing for today using collagen, K-Y jelly, Adaptic, Kerlix.  Recommend wrapping from the distal foot to the proximal calf to assist with decreasing swelling.  There is no sign of infection.  She is healing very nicely, I suspect this will continue to epithelialize.  All of their questions were answered to their content.  A picture was taken and placed in the patient's chart with her consent.  Carola Rhine Lana Flaim 04/18/2020, 1:16 PM

## 2020-04-18 ENCOUNTER — Ambulatory Visit (INDEPENDENT_AMBULATORY_CARE_PROVIDER_SITE_OTHER): Payer: Medicare HMO | Admitting: Surgical

## 2020-04-18 ENCOUNTER — Encounter: Payer: Self-pay | Admitting: Surgical

## 2020-04-18 VITALS — BP 117/65 | HR 12

## 2020-04-18 DIAGNOSIS — S81802D Unspecified open wound, left lower leg, subsequent encounter: Secondary | ICD-10-CM | POA: Diagnosis not present

## 2020-04-20 NOTE — Progress Notes (Signed)
Subjective: 87 y.o. returns the office today for painful, elongated, thickened toenails which she cannot trim herself. Denies any redness or drainage around the nails.  She is going to wound care center for wound on her left leg.  Denies any systemic complaints such as fevers, chills, nausea, vomiting.   PCP: Wendie Agreste, MD   Objective: NAD DP/PT pulses palpable, CRT less than 3 seconds Nails hypertrophic, dystrophic, elongated, brittle, discolored 10. There is tenderness overlying the nails 1-5 bilaterally. There is no surrounding erythema or drainage along the nail sites. Dressing intact to the left leg could not evaluate the wound.  Will defer to the wound care center. No pain with calf compression, swelling, warmth, erythema.  Assessment: Patient presents with symptomatic onychomycosis  Plan: -Treatment options including alternatives, risks, complications were discussed -Nails sharply debrided 10 without complication/bleeding. -Continue Cymbalta for neuropathy -Discussed daily foot inspection. If there are any changes, to call the office immediately.  -Follow-up in 3 months or sooner if any problems are to arise. In the meantime, encouraged to call the office with any questions, concerns, changes symptoms.  Celesta Gentile, DPM

## 2020-04-25 DIAGNOSIS — H353123 Nonexudative age-related macular degeneration, left eye, advanced atrophic without subfoveal involvement: Secondary | ICD-10-CM | POA: Diagnosis not present

## 2020-04-25 DIAGNOSIS — H35373 Puckering of macula, bilateral: Secondary | ICD-10-CM | POA: Diagnosis not present

## 2020-04-25 DIAGNOSIS — H353114 Nonexudative age-related macular degeneration, right eye, advanced atrophic with subfoveal involvement: Secondary | ICD-10-CM | POA: Diagnosis not present

## 2020-04-28 ENCOUNTER — Telehealth: Payer: Self-pay | Admitting: Gastroenterology

## 2020-04-28 NOTE — Telephone Encounter (Signed)
Patient called this evening. She has questions about protonix she takes, wonders if it is related to her back pain that is radiating to her legs? Hard to clarify exactly what symptoms she is referring today. She states she has been on this for 18 months. When I review the chart she was last seen by our office in 2020 and was on nexium at the time. I told her this would not be likely to cause her pains in her back / legs. If she is convinced there is a relationship she can try holding it. When asked why she is calling tonight about this question, she states she is feeling worse recently and looking for relief. She should talk with her PCP about back pain if that is her main issue.  Otherwise, think unlikely PPI is causing all of these symptoms. She questions why she needs to be on it. I told her that we can call her back during regular hours to discuss this issue further or book her a follow up visit with our office.   Patty, can you please contact this patient and book her a follow up with Dr. Rush Landmark.   Gabe. FYI

## 2020-04-29 DIAGNOSIS — M2550 Pain in unspecified joint: Secondary | ICD-10-CM | POA: Diagnosis not present

## 2020-04-29 DIAGNOSIS — M1990 Unspecified osteoarthritis, unspecified site: Secondary | ICD-10-CM | POA: Diagnosis not present

## 2020-04-29 DIAGNOSIS — M810 Age-related osteoporosis without current pathological fracture: Secondary | ICD-10-CM | POA: Diagnosis not present

## 2020-04-29 DIAGNOSIS — Z79899 Other long term (current) drug therapy: Secondary | ICD-10-CM | POA: Diagnosis not present

## 2020-04-29 DIAGNOSIS — M7061 Trochanteric bursitis, right hip: Secondary | ICD-10-CM | POA: Diagnosis not present

## 2020-04-29 DIAGNOSIS — M25551 Pain in right hip: Secondary | ICD-10-CM | POA: Diagnosis not present

## 2020-04-29 LAB — COMPREHENSIVE METABOLIC PANEL
Albumin: 4.1 (ref 3.5–5.0)
Calcium: 9.4 (ref 8.7–10.7)
Globulin: 2.3

## 2020-04-29 LAB — CBC AND DIFFERENTIAL
HCT: 37 (ref 36–46)
Hemoglobin: 12.1 (ref 12.0–16.0)
Platelets: 159 (ref 150–399)
WBC: 5.2

## 2020-04-29 LAB — VITAMIN D 25 HYDROXY (VIT D DEFICIENCY, FRACTURES): Vit D, 25-Hydroxy: 68.2

## 2020-04-29 LAB — HEPATIC FUNCTION PANEL
ALT: 8 (ref 7–35)
AST: 20 (ref 13–35)
Alkaline Phosphatase: 81 (ref 25–125)
Bilirubin, Total: 0.4

## 2020-04-29 LAB — BASIC METABOLIC PANEL
BUN: 21 (ref 4–21)
CO2: 24 — AB (ref 13–22)
Chloride: 91 — AB (ref 99–108)
Creatinine: 0.6 (ref 0.5–1.1)
Glucose: 78
Potassium: 4.6 (ref 3.4–5.3)
Sodium: 130 — AB (ref 137–147)

## 2020-04-29 LAB — CBC: RBC: 3.75 — AB (ref 3.87–5.11)

## 2020-04-29 NOTE — Telephone Encounter (Signed)
I called the pt and advised her to bring her questions and concerns with her to her appt in May.  She has agreed to the appt and asking her questions at that time.

## 2020-04-29 NOTE — Telephone Encounter (Signed)
Thanks for update. Will discuss further at clinic visit. GM

## 2020-04-29 NOTE — Telephone Encounter (Signed)
Message sent to the schedulers to call and get the pt set up for appt.

## 2020-04-29 NOTE — Telephone Encounter (Signed)
Called and spoke to pt. Appointment scheduled for 06-10-20 at 2:10pm. She would still like a call back from nurse or provider regarding PPI causing "all sorts of pains". I offered a sooner appointment w/APP but declined because [place] member[place].

## 2020-05-01 ENCOUNTER — Encounter: Payer: Self-pay | Admitting: Orthopedic Surgery

## 2020-05-01 ENCOUNTER — Other Ambulatory Visit: Payer: Self-pay

## 2020-05-01 ENCOUNTER — Other Ambulatory Visit: Payer: Medicare HMO

## 2020-05-01 ENCOUNTER — Ambulatory Visit (INDEPENDENT_AMBULATORY_CARE_PROVIDER_SITE_OTHER): Payer: Medicare HMO | Admitting: Orthopedic Surgery

## 2020-05-01 VITALS — BP 128/72 | HR 75 | Temp 97.7°F | Resp 20 | Ht 59.0 in | Wt 112.4 lb

## 2020-05-01 DIAGNOSIS — E538 Deficiency of other specified B group vitamins: Secondary | ICD-10-CM

## 2020-05-01 DIAGNOSIS — N952 Postmenopausal atrophic vaginitis: Secondary | ICD-10-CM | POA: Diagnosis not present

## 2020-05-01 DIAGNOSIS — J302 Other seasonal allergic rhinitis: Secondary | ICD-10-CM

## 2020-05-01 DIAGNOSIS — E46 Unspecified protein-calorie malnutrition: Secondary | ICD-10-CM | POA: Diagnosis not present

## 2020-05-01 DIAGNOSIS — E782 Mixed hyperlipidemia: Secondary | ICD-10-CM | POA: Diagnosis not present

## 2020-05-01 DIAGNOSIS — G894 Chronic pain syndrome: Secondary | ICD-10-CM

## 2020-05-01 DIAGNOSIS — D508 Other iron deficiency anemias: Secondary | ICD-10-CM

## 2020-05-01 DIAGNOSIS — I10 Essential (primary) hypertension: Secondary | ICD-10-CM

## 2020-05-01 DIAGNOSIS — S81802D Unspecified open wound, left lower leg, subsequent encounter: Secondary | ICD-10-CM

## 2020-05-01 DIAGNOSIS — K5901 Slow transit constipation: Secondary | ICD-10-CM

## 2020-05-01 DIAGNOSIS — E039 Hypothyroidism, unspecified: Secondary | ICD-10-CM

## 2020-05-01 DIAGNOSIS — R630 Anorexia: Secondary | ICD-10-CM

## 2020-05-01 NOTE — Patient Instructions (Signed)
Constipation, Adult Constipation is when a person has trouble pooping (having a bowel movement). When you have this condition, you may poop fewer than 3 times a week. Your poop (stool) may also be dry, hard, or bigger than normal. Follow these instructions at home: Eating and drinking  Eat foods that have a lot of fiber, such as: ? Fresh fruits and vegetables. ? Whole grains. ? Beans.  Eat less of foods that are low in fiber and high in fat and sugar, such as: ? French fries. ? Hamburgers. ? Cookies. ? Candy. ? Soda.  Drink enough fluid to keep your pee (urine) pale yellow.   General instructions  Exercise regularly or as told by your doctor. Try to do 150 minutes of exercise each week.  Go to the restroom when you feel like you need to poop. Do not hold it in.  Take over-the-counter and prescription medicines only as told by your doctor. These include any fiber supplements.  When you poop: ? Do deep breathing while relaxing your lower belly (abdomen). ? Relax your pelvic floor. The pelvic floor is a group of muscles that support the rectum, bladder, and intestines (as well as the uterus in women).  Watch your condition for any changes. Tell your doctor if you notice any.  Keep all follow-up visits as told by your doctor. This is important. Contact a doctor if:  You have pain that gets worse.  You have a fever.  You have not pooped for 4 days.  You vomit.  You are not hungry.  You lose weight.  You are bleeding from the opening of the butt (anus).  You have thin, pencil-like poop. Get help right away if:  You have a fever, and your symptoms suddenly get worse.  You leak poop or have blood in your poop.  Your belly feels hard or bigger than normal (bloated).  You have very bad belly pain.  You feel dizzy or you faint. Summary  Constipation is when a person poops fewer than 3 times a week, has trouble pooping, or has poop that is dry, hard, or bigger than  normal.  Eat foods that have a lot of fiber.  Drink enough fluid to keep your pee (urine) pale yellow.  Take over-the-counter and prescription medicines only as told by your doctor. These include any fiber supplements. This information is not intended to replace advice given to you by your health care provider. Make sure you discuss any questions you have with your health care provider. Document Revised: 11/29/2018 Document Reviewed: 11/29/2018 Elsevier Patient Education  2021 Elsevier Inc.  

## 2020-05-01 NOTE — Progress Notes (Signed)
Careteam: Patient Care Team: [name], NP as PCP - General (Adult Health Nurse Practitioner)  Seen by: Windell Moulding, AGNP-C  PLACE OF SERVICE:  [facility] Directive information    Allergies  Allergen Reactions  . Brimonidine   . Codeine Other (See Comments)  . Levofloxacin Other (See Comments)  . Lidocaine   . Lipitor [Atorvastatin]   . Meloxicam   . Myrbetriq [Mirabegron Er]   . Oxycodone   . Pregabalin   . Sulfa Antibiotics Other (See Comments)  . Timolol Maleate   . Zolpidem   . Gabapentin Rash    Pt did not like the way it made her feel    No chief complaint on file.    HPI: Patient is a 87 y.o. female seen today for medical management of chronic conditions.   Labs faxed from Dr. Lenetta Quaker office. Reviewed with patient.    Admits to using Goody's power 4 times since last visit. Still having intermittent lower back pain with sciatica. Tramadol ordered, will only use at night. Has also used tylenol prn.   No recent falls. Still using rollator. Only uses wheelchair with outside trips.   Eating about 2 meals daily. Drinks Pediasure every evening. Also has premier protein shake daily.   Recently seen by Dr. Jacqualyn Posey. Toenails trimmed. Advised daily foot inspection and continuing Cymbalta for neuropathy. 3 month f/u recommended. Reports neuropathy has improved since starting Cymbalta.   Recently seen by Roetta Sessions 2 weeks ago for f/u on LLE wound. Recommended continuing collagen dressing QOD and wrapping foot from distal foot to proximal calf. Granddaughter responsible for dressing changes.   Still having some lower leg swelling.  Elevates legs a few hours daily. Suspects due to age and inactivity.   Constipation still concerning at times. 1-2 bowel movements a week. Begins to have lower back pain when severe. Will use colace and milk of mag. Asking for other recommendations to help with regular bowel habits.     Review of Systems:  Review of  Systems  Constitutional: Negative for chills, fever, malaise/fatigue and weight loss.  HENT: Negative for hearing loss and sore throat.   Eyes: Negative for photophobia.       Glasses  Respiratory: Negative for cough, shortness of breath and wheezing.   Cardiovascular: Positive for leg swelling. Negative for chest pain.  Gastrointestinal: Positive for constipation and heartburn. Negative for abdominal pain, diarrhea, nausea and vomiting.  Genitourinary: Positive for frequency. Negative for dysuria and hematuria.  Musculoskeletal: Positive for joint pain and myalgias. Negative for falls.  Skin:       LLE wound  Neurological: Positive for weakness. Negative for dizziness and headaches.  Psychiatric/Behavioral: Positive for memory loss. Negative for depression. The patient is nervous/anxious. The patient does not have insomnia.     Past Medical History:  Diagnosis Date  . Anxiety   . Arthritis    Phreesia 03/05/2020  . Bowel obstruction ([facility]) 1950  . Bursitis   . Cancer of septum of nose (HCC)    melanoma  . Constipation    At times  . Depression   . GERD (gastroesophageal reflux disease)   . History of open leg wound   . Hypertension   . Hypothyroidism   . Osteoporosis    Phreesia 03/05/2020  . Swallowing difficulty   . Thyroid activity decreased   . Torn rotator cuff    Past Surgical History:  Procedure Laterality Date  . ABDOMINAL HYSTERECTOMY N/A  Phreesia 03/05/2020  . APPLICATION OF A-CELL OF EXTREMITY Left 10/17/2019   Procedure: APPLICATION OF INTEGRA BILAYER WOUND MATRIX OF EXTREMITY;  Surgeon: Wallace Going, DO;  Location: [facility];  Service: Plastics;  Laterality: Left;  . BIOPSY  12/07/2017   Procedure: BIOPSY;  Surgeon: Rush Landmark Telford Nab., MD;  Location: [facility];  Service: Gastroenterology;;  . CHOLECYSTECTOMY    . ESOPHAGOGASTRODUODENOSCOPY (EGD) WITH PROPOFOL N/A 12/07/2017   Procedure: ESOPHAGOGASTRODUODENOSCOPY (EGD)  WITH PROPOFOL;  Surgeon: Rush Landmark Telford Nab., MD;  Location: [facility];  Service: Gastroenterology;  Laterality: N/A;  May need Dilation  . HERNIA REPAIR N/A    Phreesia 03/05/2020  . hysterecrtomy    . I & D EXTREMITY Left 10/17/2019   Procedure: IRRIGATION AND DEBRIDEMENT EXTREMITY;  Surgeon: Wallace Going, DO;  Location: [facility];  Service: Plastics;  Laterality: Left;  45 min  . JOINT REPLACEMENT N/A    Phreesia 03/05/2020  . left hip surgery    . ROTATOR CUFF REPAIR Right   . SMALL INTESTINE SURGERY     Social History:   reports that she has never smoked. She has never used smokeless tobacco. She reports that she does not drink alcohol and does not use drugs.  Family History  Problem Relation Age of Onset  . Uterine cancer Mother   . Hypertension Father   . Kidney failure Brother   . Cancer Son   . Cancer Daughter   . Colon cancer Neg Hx   . Esophageal cancer Neg Hx   . Liver disease Neg Hx   . Inflammatory bowel disease Neg Hx   . Pancreatic cancer Neg Hx   . Rectal cancer Neg Hx   . Stomach cancer Neg Hx     Medications: Patient's Medications  New Prescriptions   No medications on file  Previous Medications   ACETAMINOPHEN (TYLENOL) 325 MG TABLET    Take 325 mg by mouth as needed.   ALPRAZOLAM (XANAX) 0.25 MG TABLET    Take 1 tablet (0.25 mg total) by mouth 3 (three) times daily.   CASTOR OIL LIQUID    Take 15 mLs by mouth as needed.   CETIRIZINE (ZYRTEC) 10 MG TABLET    TAKE 1 TABLET BY MOUTH EVERY DAY   CYANOCOBALAMIN 1000 MCG/ML LIQD    Take 1 tablet by mouth daily in the afternoon.   DOCUSATE SODIUM (COLACE) 100 MG CAPSULE    Take 100 mg by mouth as needed.   DULOXETINE (CYMBALTA) 30 MG CAPSULE    Take 30 mg by mouth at bedtime.   ESTROGENS, CONJUGATED (PREMARIN VA)    Place 1 application vaginally daily.   FERROUS SULFATE 325 (65 FE) MG EC TABLET    Take 325 mg by mouth. Every other week   GINKGO BILOBA 120 MG CAPS    Take 120 mg  by mouth 2 (two) times a week.   LACTOBACILLUS (PROBIOTIC ACIDOPHILUS PO)    Take 1 capsule by mouth daily.   LEVOTHYROXINE (SYNTHROID) 75 MCG TABLET    Take 1 tablet (75 mcg total) by mouth daily.   LOPERAMIDE (IMODIUM) 2 MG CAPSULE    Take by mouth as needed for diarrhea or loose stools.   LOSARTAN (COZAAR) 100 MG TABLET    Take 1 tablet (100 mg total) by mouth daily.   MAGNESIUM GLUCONATE 27.5 MG TABS    Take 500 mg by mouth daily in the afternoon.   MENTHOL, TOPICAL ANALGESIC, 154 MG PADS  Place 1 each onto the skin daily as needed (for hip pain.).   NON FORMULARY    MV-MIN-Folic Acid-Lutein 423-536 mcg Chew take by mouth   NON FORMULARY    Ubidecarenone-Omega 3-vit E 25-150-200   NUTRITIONAL SUPPLEMENTS (FEEDING SUPPLEMENT, PEDIASURE 1.5,) LIQD LIQUID    Take 237 mLs by mouth 2 (two) times daily between meals.   NUTRITIONAL SUPPLEMENTS (FEEDING SUPPLEMENT, PEDIASURE 1.5,) LIQD LIQUID    Take 237 mLs by mouth 2 (two) times daily between meals.   PANTOPRAZOLE (PROTONIX) 40 MG TABLET    Take 1 tablet (40 mg total) by mouth 2 (two) times daily.   PROLIA 60 MG/ML SOSY INJECTION    Take 60 mg by mouth every 6 (six) months.   PUMPKIN SEED-SOY GERM (AZO BLADDER CONTROL/GO-LESS PO)    Take 1 tablet by mouth daily as needed (for bladder pain.).   TRAMADOL (ULTRAM) 50 MG TABLET    TAKE 1 TABLET BY MOUTH 3 TIMES DAILY as needed for pain   TROLAMINE SALICYLATE (ASPERCREME) 10 % CREAM    Apply 1 application topically as needed for muscle pain.  Modified Medications   No medications on file  Discontinued Medications   No medications on file    Physical Exam:  There were no vitals filed for this visit. There is no height or weight on file to calculate BMI. Wt Readings from Last 3 Encounters:  04/11/20 110 lb (49.9 kg)  02/15/20 110 lb (49.9 kg)  01/10/20 110 lb (49.9 kg)    Physical Exam Vitals reviewed.  Constitutional:      General: She is not in acute distress. HENT:     Head:  Normocephalic.  Eyes:     General:        Right eye: No discharge.        Left eye: No discharge.  Neck:     Vascular: No carotid bruit.  Cardiovascular:     Rate and Rhythm: Normal rate and regular rhythm.     Pulses: Normal pulses.     Heart sounds: Normal heart sounds. No murmur heard.   Pulmonary:     Effort: Pulmonary effort is normal. No respiratory distress.     Breath sounds: Normal breath sounds. No wheezing.  Abdominal:     General: Abdomen is flat. Bowel sounds are normal. There is no distension.     Palpations: Abdomen is soft.     Tenderness: There is no abdominal tenderness.  Musculoskeletal:     Cervical back: Normal range of motion.     Right lower leg: No edema.     Left lower leg: No edema.  Skin:    General: Skin is warm and dry.     Capillary Refill: Capillary refill takes less than 2 seconds.     Findings: Lesion present.     Comments: Left lower leg lesion about 5 x 1 cm. Granulation tissue present. No drainage or odor. Surrounding tissue intact.   Neurological:     General: No focal deficit present.     Mental Status: She is alert and oriented to person, place, and time.     Motor: Weakness present.     Gait: Gait abnormal.     Comments: rollator/wheelchair  Psychiatric:        Mood and Affect: Mood normal.        Behavior: Behavior normal.    Labs reviewed: Basic Metabolic Panel: Recent Labs    06/26/19 1424 07/17/19 0944 08/07/19 0848 10/17/19 1443  01/10/20 1617  NA 132*   < > 134 130* 135  K 4.3   < > 4.1 3.6 4.9  CL 95*   < > 98 91* 96  CO2 24   < > 24 28 28   GLUCOSE 95   < > 76 99 78  BUN 18   < > 13 12 19   CREATININE 0.73   < > 0.66 0.66 0.68  CALCIUM 9.1   < > 9.0 8.6* 9.4  TSH 0.716  --   --   --  1.100   < > = values in this interval not displayed.   Liver Function Tests: Recent Labs    07/17/19 0944 08/07/19 0848 01/10/20 1617  AST 72* 23 16  ALT 91* 15 9  ALKPHOS 686* 257* 79  BILITOT 0.6 0.6 0.4  PROT 6.2 5.9*  6.5  ALBUMIN 4.3 3.9 4.0   No results for input(s): LIPASE, AMYLASE in the last 8760 hours. No results for input(s): AMMONIA in the last 8760 hours. CBC: Recent Labs    06/26/19 1424  WBC 7.0  HGB 13.9  HCT 42.1  MCV 95  PLT 185   Lipid Panel: Recent Labs    06/26/19 1424  CHOL 252*  HDL 94  LDLCALC 144*  TRIG 84  CHOLHDL 2.7   TSH: Recent Labs    06/26/19 1424 01/10/20 1617  TSH 0.716 1.100   A1C: No results found for: HGBA1C   Assessment/Plan 1. Slow transit constipation - abdomen not distended, BM 1-2 x/week - cont colace and miralax - recommend hydration with water 4-6 glasses daily - avoid caffeine drinks  2. Poor appetite - protein 6.4, albumin 4.1 - no recent weight loss - cont current diet - cont Pediasure and premier protein supplement  3. Essential hypertension - bp at goal with losartan - cont low sodium diet  4. Mixed hyperlipidemia - not on statin due to age  20. Vaginitis, atrophic - followed by Dr. Primitivo Gauze - cont premarin and vaseline  6. Seasonal allergies - stable with Zyrtec  7. Wound of left lower extremity, subsequent encounter - recently seen by plastics - wound healing slowly, no sign of infection - cont collagen dressing, change QOD  8. Acquired hypothyroidism - stable with levothyroxine  9. Protein-calorie malnutrition, unspecified severity ([place]) - protein 6.4, albumin 4.1 - no recent weight loss - cont current diet - cont Pediasure and premier protein supplement  10. Chronic pain syndrome - continues to use Goody's powder, advised to stop - cont Tramadol at night for low back pain  Total Time: 41 minutes. 50% time spent doing patient counseling and coordination of care regarding chronic pain, constipation and medication administration.    Next appt: 09/11/2020 Windell Moulding, [facility] [phone]

## 2020-05-13 ENCOUNTER — Other Ambulatory Visit: Payer: Medicare HMO

## 2020-05-13 ENCOUNTER — Ambulatory Visit: Payer: Medicare HMO | Admitting: Orthopedic Surgery

## 2020-05-14 DIAGNOSIS — H903 Sensorineural hearing loss, bilateral: Secondary | ICD-10-CM | POA: Diagnosis not present

## 2020-05-15 ENCOUNTER — Ambulatory Visit: Payer: Self-pay | Admitting: Family Medicine

## 2020-05-15 DIAGNOSIS — D485 Neoplasm of uncertain behavior of skin: Secondary | ICD-10-CM | POA: Diagnosis not present

## 2020-05-15 DIAGNOSIS — L814 Other melanin hyperpigmentation: Secondary | ICD-10-CM | POA: Diagnosis not present

## 2020-05-15 DIAGNOSIS — Z85828 Personal history of other malignant neoplasm of skin: Secondary | ICD-10-CM | POA: Diagnosis not present

## 2020-05-15 DIAGNOSIS — B078 Other viral warts: Secondary | ICD-10-CM | POA: Diagnosis not present

## 2020-05-15 DIAGNOSIS — L821 Other seborrheic keratosis: Secondary | ICD-10-CM | POA: Diagnosis not present

## 2020-05-16 ENCOUNTER — Other Ambulatory Visit: Payer: Self-pay | Admitting: Family Medicine

## 2020-05-20 ENCOUNTER — Ambulatory Visit (INDEPENDENT_AMBULATORY_CARE_PROVIDER_SITE_OTHER): Payer: Medicare HMO | Admitting: Surgical

## 2020-05-20 ENCOUNTER — Encounter: Payer: Self-pay | Admitting: Surgical

## 2020-05-20 ENCOUNTER — Other Ambulatory Visit: Payer: Self-pay

## 2020-05-20 VITALS — BP 117/74 | HR 87

## 2020-05-20 DIAGNOSIS — S81802D Unspecified open wound, left lower leg, subsequent encounter: Secondary | ICD-10-CM | POA: Diagnosis not present

## 2020-05-20 NOTE — Progress Notes (Signed)
   Referring Provider [name], NP [address],  [place] [id number]   CC: No chief complaint on file.     Kayla Lloyd is an 87 y.o. female.  HPI: Patient is a 87 year old female here for follow-up on her left lower extremity wound after a trauma/hematoma.  Patient is here with her granddaughter.  They are very pleased with how things are healing.  They really liked the collagen dressing changes.  [place] and would like an appointment just before that to evaluate the wound.  They are okay with a telephone/video visit.    Review of Systems General: No fevers or chills  Physical Exam Vitals with BMI 05/01/2020 04/18/2020 04/11/2020  Height 4\' 11"  - 4\' 11"   Weight 112 lbs 6 oz - 110 lbs  BMI 62.94 - 76.54  Systolic 650 354 656  Diastolic 72 65 70  Pulse 75 12 87    General:  No acute distress,  Alert and oriented, Non-Toxic, Normal speech and affect Left lower extremity: Left lower extremity wound is 3.9 x 0.8 cm.  There is no surrounding cellulitic changes or erythema noted.  No foul odor noted.  It is difficult to evaluate the wound bed as there is some dried collagen covering the wound.  Everything appears stable and healing well.  Assessment/Plan Recommend continue with collagen dressing changes every other day.  We can do a telephone/video visit in 1 month to reevaluate.  All of their questions were answered to their content.  A picture was taken and placed in the patient's chart with her consent.  Carola Rhine [name] 05/20/2020, 2:03 PM

## 2020-05-22 ENCOUNTER — Ambulatory Visit: Payer: Medicare HMO | Admitting: Orthopedic Surgery

## 2020-05-22 ENCOUNTER — Other Ambulatory Visit: Payer: Medicare HMO

## 2020-05-23 ENCOUNTER — Other Ambulatory Visit: Payer: Self-pay | Admitting: Family Medicine

## 2020-06-04 DIAGNOSIS — M19011 Primary osteoarthritis, right shoulder: Secondary | ICD-10-CM | POA: Diagnosis not present

## 2020-06-05 DIAGNOSIS — H02055 Trichiasis without entropian left lower eyelid: Secondary | ICD-10-CM | POA: Diagnosis not present

## 2020-06-10 ENCOUNTER — Ambulatory Visit (INDEPENDENT_AMBULATORY_CARE_PROVIDER_SITE_OTHER): Payer: Medicare HMO | Admitting: Gastroenterology

## 2020-06-10 ENCOUNTER — Encounter: Payer: Self-pay | Admitting: Gastroenterology

## 2020-06-10 VITALS — BP 108/58 | HR 88 | Ht 59.0 in | Wt 110.0 lb

## 2020-06-10 DIAGNOSIS — Z79899 Other long term (current) drug therapy: Secondary | ICD-10-CM | POA: Diagnosis not present

## 2020-06-10 DIAGNOSIS — R1313 Dysphagia, pharyngeal phase: Secondary | ICD-10-CM

## 2020-06-10 DIAGNOSIS — K5909 Other constipation: Secondary | ICD-10-CM | POA: Diagnosis not present

## 2020-06-10 DIAGNOSIS — K219 Gastro-esophageal reflux disease without esophagitis: Secondary | ICD-10-CM

## 2020-06-10 DIAGNOSIS — R399 Unspecified symptoms and signs involving the genitourinary system: Secondary | ICD-10-CM | POA: Diagnosis not present

## 2020-06-10 NOTE — Patient Instructions (Addendum)
Continue Protonix 40mg  once daily through May, then cut pill to 20mg  once daily in June. Stay on Protonix 20mg  once daily until follow-up appointment in 3 months.   If you have an increase of difficulty swallowing pills then call us and barium swallow with tablet maybe considered.   If you are age 85 or older, your body mass index should be between 23-30. Your Body mass index is 22.22 kg/m. If this is out of the aforementioned range listed, please consider follow up with your Primary Care Provider.  Please keep follow-up on: 09/10/20 @ 1:30pm  Thank you for choosing me and [facility].  Dr. Rush Landmark

## 2020-06-10 NOTE — Progress Notes (Addendum)
[facility] VISIT   Primary Care Provider Woodridge, Mervyn Gay, NP [phone] [place] [address] [phone]  Patient Profile: Coralynn Gaona is a 87 y.o. female with a pmh significant for hypertension, hypothyroidism, prior bowel obstruction (no resection many years ago), melanoma of the nose, status post cholecystectomy, status post hysterectomy, sleep apnea, GERD, gastritis, chronic constipation.  The patient presents to the [facility] for an evaluation and management of problem(s) noted below:  Problem List 1. Gastroesophageal reflux disease, unspecified whether esophagitis present   2. Pharyngeal dysphagia   3. Chronic constipation   4. Long-term current use of proton pump inhibitor therapy     History of Present Illness Please see initial consultation note and prior progress notes by NP Chester Holstein and myself for full details of HPI.   Interval History Today, the patient returns for a scheduled follow-up and she is accompanied by her granddaughter.  Patient states that she has had issues with her PPI therapy.  She is concerned that the PPI therapy is causing her to have a lot of joint pains.  As such, she and her granddaughter decreased her pill to once daily.  She feels that there has been some slight improvement in some of her joint pains but she has longstanding issues of back pain and other joint pains/arthralgias that been present for years.  She still has abdominal upset at times.  She has continued to have issues with pill dysphagia but her granddaughter does not feel that this is any worse than what it has been previously.  We had her be evaluated by SLP which did not show frank oropharyngeal dysphagia.  Weight has been stable overall.  No alteration in her bowel habits.  No blood in her stools.  She is not interested in a colonoscopy.  GI Review of Systems Positive as above Negative for odynophagia, nausea, vomiting,  alteration in bowel habits   Review of Systems General: Denies fevers/chills/unintentional weight loss Cardiovascular: Denies chest pain Pulmonary: Denies shortness of breath Gastroenterological: See HPI Genitourinary: Denies darkened urine Hematological: Positive for easy bruising/bleeding Dermatological: Denies jaundice Psychological: Mood is stable   Medications Current Outpatient Medications  Medication Sig Dispense Refill  . acetaminophen (TYLENOL) 325 MG tablet Take 325 mg by mouth as needed.    Marland Kitchen acetaminophen (TYLENOL) 500 MG tablet Take 500 mg by mouth as needed.    . ALPRAZolam (XANAX) 0.25 MG tablet Take 1 tablet (0.25 mg total) by mouth 3 (three) times daily. 90 tablet 5  . castor oil liquid Take 15 mLs by mouth as needed.    . cetirizine (ZYRTEC) 10 MG tablet TAKE 1 TABLET BY MOUTH EVERY DAY 90 tablet 3  . Cyanocobalamin 1000 MCG/ML LIQD Take 1 tablet by mouth daily in the afternoon.    . docusate sodium (COLACE) 100 MG capsule Take 100 mg by mouth as needed.    . DULoxetine (CYMBALTA) 30 MG capsule Take 30 mg by mouth at bedtime.    . Estrogens, Conjugated (PREMARIN VA) Place 1 application vaginally daily.    . ferrous sulfate 325 (65 FE) MG EC tablet Take 325 mg by mouth. Every other week    . Ginkgo Biloba 120 MG CAPS Take 120 mg by mouth 2 (two) times a week.    . Lactobacillus (PROBIOTIC ACIDOPHILUS PO) Take 1 capsule by mouth daily.    Marland Kitchen levothyroxine (SYNTHROID) 75 MCG tablet Take 1 tablet (75 mcg total) by mouth daily. 90 tablet 1  .  loperamide (IMODIUM) 2 MG capsule Take by mouth as needed for diarrhea or loose stools.    Marland Kitchen losartan (COZAAR) 100 MG tablet Take 1 tablet (100 mg total) by mouth daily. 90 tablet 1  . Magnesium Gluconate 27.5 MG TABS Take 500 mg by mouth daily in the afternoon.    . Menthol, Topical Analgesic, 154 MG PADS Place 1 each onto the skin daily as needed (for hip pain.).    Marland Kitchen NON FORMULARY MV-MIN-Folic Acid-Lutein 500-250 mcg Chew take by  mouth    . NON FORMULARY Ubidecarenone-Omega 3-vit E 25-150-200    . Nutritional Supplements (FEEDING SUPPLEMENT, PEDIASURE 1.5,) LIQD liquid Take 237 mLs by mouth 2 (two) times daily between meals. 237 mL 6  . pantoprazole (PROTONIX) 40 MG tablet Take 1 tablet (40 mg total) by mouth 2 (two) times daily. (Patient taking differently: Take 40 mg by mouth daily.) 180 tablet 0  . PROLIA 60 MG/ML SOSY injection Take 60 mg by mouth every 6 (six) months.  0  . Pumpkin Seed-Soy Germ (AZO BLADDER CONTROL/GO-LESS PO) Take 1 tablet by mouth daily as needed (for bladder pain.).    Marland Kitchen traMADol (ULTRAM) 50 MG tablet TAKE 1 TABLET BY MOUTH 3 TIMES DAILY as needed for pain 60 tablet 1  . trolamine salicylate (ASPERCREME) 10 % cream Apply 1 application topically as needed for muscle pain.     No current facility-administered medications for this visit.    Allergies Allergies  Allergen Reactions  . Brimonidine   . Codeine Other (See Comments)  . Levofloxacin Other (See Comments)  . Lidocaine   . Lipitor [Atorvastatin]   . Meloxicam   . Myrbetriq [Mirabegron Er]   . Oxycodone   . Pregabalin   . Sulfa Antibiotics Other (See Comments)  . Timolol Maleate   . Zolpidem   . Gabapentin Rash    Pt did not like the way it made her feel    Histories Past Medical History:  Diagnosis Date  . Anxiety   . Arthritis    Phreesia 03/05/2020  . Bowel obstruction (HCC) 1950  . Bursitis   . Cancer of septum of nose (HCC)    melanoma  . Constipation    At times  . Depression   . GERD (gastroesophageal reflux disease)   . History of open leg wound   . Hypertension   . Hypothyroidism   . Osteoporosis    Phreesia 03/05/2020  . Swallowing difficulty   . Thyroid activity decreased   . Torn rotator cuff    Past Surgical History:  Procedure Laterality Date  . ABDOMINAL HYSTERECTOMY N/A    Phreesia 03/05/2020  . APPLICATION OF A-CELL OF EXTREMITY Left 10/17/2019   Procedure: APPLICATION OF INTEGRA BILAYER  WOUND MATRIX OF EXTREMITY;  Surgeon: Peggye Form, DO;  Location: [facility];  Service: Plastics;  Laterality: Left;  . BIOPSY  12/07/2017   Procedure: BIOPSY;  Surgeon: Meridee Score Netty Starring., MD;  Location: [facility];  Service: Gastroenterology;;  . CHOLECYSTECTOMY    . ESOPHAGOGASTRODUODENOSCOPY (EGD) WITH PROPOFOL N/A 12/07/2017   Procedure: ESOPHAGOGASTRODUODENOSCOPY (EGD) WITH PROPOFOL;  Surgeon: Meridee Score Netty Starring., MD;  Location: [facility];  Service: Gastroenterology;  Laterality: N/A;  May need Dilation  . HERNIA REPAIR N/A    Phreesia 03/05/2020  . hysterecrtomy    . I & D EXTREMITY Left 10/17/2019   Procedure: IRRIGATION AND DEBRIDEMENT EXTREMITY;  Surgeon: Peggye Form, DO;  Location: [facility];  Service: Plastics;  Laterality: Left;  45 min  . JOINT REPLACEMENT N/A    Phreesia 03/05/2020  . left hip surgery    . ROTATOR CUFF REPAIR Right   . SMALL INTESTINE SURGERY     Social History   Socioeconomic History  . Marital status: Widowed    Spouse name: Not on file  . Number of children: 8  . Years of education: Not on file  . Highest education level: Not on file  Occupational History  . Not on file  Tobacco Use  . Smoking status: Never Smoker  . Smokeless tobacco: Never Used  Vaping Use  . Vaping Use: Never used  Substance and Sexual Activity  . Alcohol use: No    Comment: Red wine , 1 glass, 1-2 times a week  . Drug use: No  . Sexual activity: Not Currently  Other Topics Concern  . Not on file  Social History Narrative   Diet:No       Do you drink/ eat things with caffeine? Rarely Green Tea      Marital status: Widowed                              What year were you married ? 1944      Do you live in a house, apartment,assistred living, condo, trailer, etc.)? Apartment      Is it one or more stories? 1 Story      How many persons live in your home ? 2 Occasionally 3       Do you have any pets in  your home ?(please list) No      Highest Level of education completed: GED- Nursing Classes       Current or past profession:  CNA      Do you exercise?  We Try                            Type & how often  Stationary Bike- [place]      ADVANCED DIRECTIVES (Please bring copies)      Do you have a living will? Yes      Do you have a DNR form?  No                     If not, do you want to discuss one?       Do you have signed POA?HPOA forms? No                If so, please bring to your appointment      FUNCTIONAL STATUS- To be completed by Spouse / child / Staff       Do you have difficulty bathing or dressing yourself ?  Yes- Shoulder      Do you have difficulty preparing food or eating ? Yes      Do you have difficulty managing your mediation ?  No But Care Taker Assist       Do you have difficulty managing your finances ?  No      Do you have difficulty affording your medication ?  No      Social Determinants of Radio broadcast assistant Strain: Not on file  Food Insecurity: Not on file  Transportation Needs: Not on file  Physical Activity: Not on file  Stress: Not on file  Social Connections: Not on file  Intimate Partner Violence: Not on file   Family History  Problem Relation Age of Onset  . Uterine cancer Mother   . Hypertension Father   . Kidney failure Brother   . Cancer Son   . Cancer Daughter   . Colon cancer Neg Hx   . Esophageal cancer Neg Hx   . Liver disease Neg Hx   . Inflammatory bowel disease Neg Hx   . Pancreatic cancer Neg Hx   . Rectal cancer Neg Hx   . Stomach cancer Neg Hx    I have reviewed her medical, social, and family history in detail and updated the electronic medical record as necessary.    PHYSICAL EXAMINATION  BP (!) 108/58   Pulse 88   Ht 4\' 11"  (1.499 m)   Wt 110 lb (49.9 kg)   SpO2 98%   BMI 22.22 kg/m  Wt Readings from Last 3 Encounters:  06/10/20 110 lb (49.9 kg)  05/01/20 112 lb 6.4 oz (51 kg)  04/11/20 110 lb  (49.9 kg)  GEN: NAD, appears younger than stated age, doesn't appear chronically ill, accompanied by granddaughter PSYCH: Cooperative, without pressured speech EYE: Conjunctivae pink, sclerae anicteric ENT: Masked CV: Nontachycardic RESP: No audible wheezing GI: NABS, soft, NT/ND, without rebound or guarding, no HSM appreciated, surgical scar appreciated in mid abdomen MSK/EXT: No significant lower extremity edema today SKIN: No jaundice NEURO:  Alert & Oriented x 3, no focal deficits   REVIEW OF DATA  I reviewed the following data at the time of this encounter:  GI Procedures and Studies  November 2019 EGD - No gross lesions in proximal esophagus. Discolored, white-paper flaking mucosa in the esophagus. Cells for cytology obtained to rule out Candida. EoE biopsies obtained. - Z-line regular, 39 cm from the incisors. - Non-bleeding erosive gastropathy. Otherwise no gross lesions in the stomach. Biopsied for HP. - No gross lesions in the duodenal bulb, in the first portion of the duodenum and in the second portion of the duodenum. Diagnosis 1. Stomach, biopsy - MILD CHRONIC GASTRITIS. - NEGATIVE FOR HELICOBACTER PYLORI. 2. Esophagus, biopsy, distal - SQUAMOUS ESOPHAGEAL EPITHELIUM WITH NO SIGNIFICANT PATHOLOGIC FINDINGS. - NEGATIVE FOR INCREASED INTRAEPITHELIAL EOSINOPHILS. 3. Esophagus, biopsy, mid proximal - SQUAMOUS ESOPHAGEAL EPITHELIUM WITH NO SIGNIFICANT PATHOLOGIC FINDINGS. - NEGATIVE FOR INCREASED INTRAEPITHELIAL EOSINOPHILS. Diagnosis BRUSHING, ESOPHAGEAL (SPECIMEN 1 OF 1 COLLECTED 12/07/17): NO MALIGNANT CELLS IDENTIFIED.  Laboratory Studies  Reviewed in epic  Imaging Studies  July 2021 right upper quadrant ultrasound IMPRESSION: Changes consistent with the post cholecystectomy state. Left hepatic cyst. Hypodensity in the region the head of the pancreas which may be related to distal common bile duct although focal mass lesion cannot totally excluded. CT could  be performed for further evaluation as clinically indicated given the patient's advanced age.   ASSESSMENT  Ms. Ricchio is a 87 y.o. female with a pmh significant for hypertension, hypothyroidism, prior bowel obstruction (no resection many years ago), melanoma of the nose, status post cholecystectomy, status post hysterectomy, sleep apnea, GERD, gastritis, chronic constipation.  The patient is seen today for evaluation and management of:  1. Gastroesophageal reflux disease, unspecified whether esophagitis present   2. Pharyngeal dysphagia   3. Chronic constipation   4. Long-term current use of proton pump inhibitor therapy    The patient is hemodynamically stable.  Clinically, she seems to be stable and improved from when she had this appointment rescheduled.  Is not clear to me that the PPI use is the etiology for her  arthralgias and joint pains that she has been experiencing but she believes that symptoms are slightly better with being on once daily dosing.  She has not had overt progressive pyrosis or dysphagia symptoms by decreasing her dose.  As such we will keep her at this dose for at least the next few weeks.  If she is doing well then we can transition her to 20 mg daily and try to minimize her PPI use overall.  If the patient has progressive dysphagia symptoms they will let us know because we may need to plan a barium swallow as well as a potential repeat endoscopy with dilation and potential esophageal manometry.  Fiber supplementation will be initiated to optimize bowel habits.  We will plan a follow-up in 3 to 4 months.  All patient questions were answered to the best of my ability, and the patient agrees to the aforementioned plan of action with follow-up as indicated.   PLAN  Continue PPI once daily 40 mg - In June cut pill in half to 20 mg daily and maintain If progressive dysphagia symptoms to solid food/pills occurs then we will consider a barium swallow with tablet and potential  EGD with dilation and consideration of manometry if barium swallow is unremarkable FiberCon or Metamucil daily   No orders of the defined types were placed in this encounter.   New Prescriptions   No medications on file   Modified Medications   No medications on file    Planned Follow Up: Return in about 3 months (around 09/10/2020).   Total Time in Face-to-Face and in Coordination of Care for patient including independent/personal interpretation/review of prior testing, medical history, examination, medication adjustment, communicating results with the patient directly, and documentation with the EHR is 25 minutes.   Justice Britain, MD [facility] Office # [phone]    Addendum after clinic visit I reviewed imaging that had been performed over the course the last few years since I had seen her in clinic.  A right upper quadrant ultrasound had suggested a potential abnormality in the head of the pancreas versus just the CBD being evaluated.  I think it would be wise to consider a CT abdomen to further evaluate the biliary tree.  I would recommend a contrasted CT with IV contrast and oral contrast and this may need to be waited on until the contrast shortage improves but I will reach out to the patient's granddaughter and discussed this and potentially order a CT scan thereafter since this was not discussed in clinic at the time of her visit.  Justice Britain, MD [facility] # [phone]

## 2020-06-11 ENCOUNTER — Encounter: Payer: Self-pay | Admitting: Gastroenterology

## 2020-06-11 DIAGNOSIS — K5909 Other constipation: Secondary | ICD-10-CM | POA: Insufficient documentation

## 2020-06-11 DIAGNOSIS — Z79899 Other long term (current) drug therapy: Secondary | ICD-10-CM | POA: Insufficient documentation

## 2020-06-11 DIAGNOSIS — K5900 Constipation, unspecified: Secondary | ICD-10-CM | POA: Insufficient documentation

## 2020-06-12 ENCOUNTER — Telehealth: Payer: Self-pay | Admitting: Gastroenterology

## 2020-06-12 ENCOUNTER — Other Ambulatory Visit: Payer: Self-pay

## 2020-06-12 DIAGNOSIS — K839 Disease of biliary tract, unspecified: Secondary | ICD-10-CM

## 2020-06-12 NOTE — Telephone Encounter (Signed)
Order entered and sent to the schedulers  

## 2020-06-12 NOTE — Telephone Encounter (Signed)
When I was completing the patient's clinic note I had an opportunity to review some of the prior imaging that had been done through other members of the medical community on Ms. Tuff.  I found an abdominal ultrasound that suggested a potential abnormality in the CBD region although this was only an ultrasound and could not define things further.  This was performed in 2021.  Although I do not have a high concern for underlying malignancy or other issues with her at this time, I do think it may be reasonable to consider a contrasted CT abdomen with IV and oral contrast at some point in the near future just to be sure that we are not missing anything that could be hiding in that region. I tried to call the patient's granddaughter, this morning, but I was not able to reach her and I left a voicemail for her to try and call back the office at her convenience. I will be in procedures all day today but happy to try and reach her in between procedures or towards the end of the day if she calls back otherwise I will try to call her back at the end of the day. FYI Patty in case the patient calls you.  Thanks. GM

## 2020-06-12 NOTE — Telephone Encounter (Signed)
I called and spoke with the patient's granddaughter.  We discussed the ultrasound imaging findings that suggested potential abnormality in the pancreas head.  This ultrasound imaging had not been reviewed at the time of her visit but not after once I was completing notation in the chart.  Discussed potential role of a CT abdomen with IV and oral contrast.  They are okay with moving forward with this. They will be away until the end of June/beginning of July and I think that is fine as well because of the contrast shortage. Please place CT abdomen IV and oral contrast order and schedule as able. Diagnosis abnormal ultrasound, dilated bile duct. Thanks. GM

## 2020-06-12 NOTE — Telephone Encounter (Signed)
Hey Dr. Rush Landmark,   Granddaughter Kayla Lloyd returned your call. She states the best contact number is [phone]. She gives permission to leave personal information regarding the imaging and why it is needed in a voicemail if she does not answer.   Thank you

## 2020-06-17 ENCOUNTER — Telehealth (INDEPENDENT_AMBULATORY_CARE_PROVIDER_SITE_OTHER): Payer: Medicare HMO | Admitting: Surgical

## 2020-06-17 ENCOUNTER — Encounter: Payer: Self-pay | Admitting: Surgical

## 2020-06-17 DIAGNOSIS — S81802D Unspecified open wound, left lower leg, subsequent encounter: Secondary | ICD-10-CM | POA: Diagnosis not present

## 2020-06-17 NOTE — Progress Notes (Signed)
   Referring Provider [name], NP [address],  [place] [address]   CC:  Chief Complaint  Patient presents with  . Follow-up      Kayla Lloyd is an 87 y.o. female.  HPI: Patient is a 87 year old female here for follow-up on her left lower extremity wound that she developed after a trauma/hematoma in July 2021.  She presents via virtual video visit with her granddaughter and great granddaughter to discuss wound healing progress.  They have been using the collagen dressing changes and feels as if this is going well.  The patient gave consent to have this visit done by telemedicine / virtual visit, two identifiers were used to identify patient. This is also consent for access the chart and treat the patient via this visit. The patient is located at home.  I, the provider, am at the office.  We spent 5 minutes together for the visit.  Joined by video.   Physical Exam Vitals with BMI 06/10/2020 05/20/2020 05/01/2020  Height 4\' 11"  - 4\' 11"   Weight 110 lbs - 112 lbs 6 oz  BMI 76.72 - 09.47  Systolic 096 283 662  Diastolic 58 74 72  Pulse 88 87 75    General:  No acute distress,  Alert and oriented, Non-Toxic, Normal speech and affect.  Presents on video, sitting in bed with family at bedside Left lower extremity wound: Left lower extremity wound is approximately 1.4 cm x 0.3 cm.  No surrounding erythema.  New epithelium noted at the edges.  Overall appears to be healing well.  Assessment/Plan 87 year old female with left lower extremity wound that she developed after trauma/hematoma in July 2021.  We have been doing collagen dressing changes and she has made significant progress over time.  I recommend continuing with collagen dressing changes.  Recommend scheduling an additional follow-up appointment in 3 to 4 weeks for reevaluation.  This can be done via video visit if the patient and family prefer.  Recommend calling with questions or concerns.  Kayla Lloyd  Kayla Lloyd 06/17/2020, 1:29 PM

## 2020-06-25 ENCOUNTER — Encounter: Payer: Self-pay | Admitting: Orthopedic Surgery

## 2020-06-26 ENCOUNTER — Other Ambulatory Visit: Payer: Self-pay | Admitting: Family Medicine

## 2020-06-26 DIAGNOSIS — R600 Localized edema: Secondary | ICD-10-CM

## 2020-06-26 DIAGNOSIS — K219 Gastro-esophageal reflux disease without esophagitis: Secondary | ICD-10-CM

## 2020-06-26 MED ORDER — ALPRAZOLAM 0.25 MG PO TABS: 0.2500 mg | ORAL_TABLET | Freq: Three times a day (TID) | ORAL | 0 refills | Status: DC

## 2020-06-26 NOTE — Telephone Encounter (Signed)
Patient requested refill Epic LR: 12/11/2019 No contract on file, added to upcoming appointment Pended Rx and sent to [facility] for approval due to Amy out of office.

## 2020-06-27 ENCOUNTER — Other Ambulatory Visit: Payer: Self-pay | Admitting: *Deleted

## 2020-06-27 MED ORDER — LOSARTAN POTASSIUM 100 MG PO TABS: 100.0000 mg | ORAL_TABLET | Freq: Every day | ORAL | 1 refills | Status: DC

## 2020-06-27 NOTE — Telephone Encounter (Signed)
Patient daughter requested refill.  

## 2020-06-30 MED ORDER — PANTOPRAZOLE SODIUM 40 MG PO TBEC: 40.0000 mg | DELAYED_RELEASE_TABLET | Freq: Two times a day (BID) | ORAL | 0 refills | Status: DC

## 2020-07-15 ENCOUNTER — Ambulatory Visit (HOSPITAL_COMMUNITY): Admission: RE | Admit: 2020-07-15 | Payer: Medicare HMO | Source: Ambulatory Visit

## 2020-07-17 ENCOUNTER — Telehealth (INDEPENDENT_AMBULATORY_CARE_PROVIDER_SITE_OTHER): Payer: Medicare HMO | Admitting: Surgical

## 2020-07-17 ENCOUNTER — Other Ambulatory Visit: Payer: Self-pay

## 2020-07-17 DIAGNOSIS — S81802D Unspecified open wound, left lower leg, subsequent encounter: Secondary | ICD-10-CM | POA: Diagnosis not present

## 2020-07-17 NOTE — Progress Notes (Signed)
   Referring Provider [name], NP [place] [place],  [place] [address]   CC: No chief complaint on file.     Kayla Lloyd is an 87 y.o. female.  HPI: Patient is a 87 year old female who presents virtually with her granddaughter to discuss her left lower extremity wound.  They are very pleased with how things are going.  She reports that they have been applying a thin coat of ointment as of yesterday and keeping the wound uncovered.  They were able to send photos through [facility] and I reviewed them.  Everything looks to be healing really well and I discussed this with them.  Patient reports today that "I am feeling fine".  She is not having any fevers or chills.  The patient gave consent to have this visit done by telemedicine / virtual visit, two identifiers were used to identify patient. This is also consent for access the chart and treat the patient via this visit. The patient is located at home in [place].  I, the provider, am at the office.  We spent 5 minutes together for the visit.  Joined by telephone.  Review of Systems General: No fevers or chills  Physical Exam Vitals with BMI 06/10/2020 05/20/2020 05/01/2020  Height 4\' 11"  - 4\' 11"   Weight 110 lbs - 112 lbs 6 oz  BMI 62.70 - 35.00  Systolic 938 182 993  Diastolic 58 74 72  Pulse 88 87 75    General:  No acute distress,  Alert and oriented, Non-Toxic, Normal speech and affect       Assessment/Plan Patient is a 87 year old female with a left lower extremity wound that she developed after trauma/hematoma in July 2021.  We have been doing collagen dressing changes and she has made great progress.  The wound has nearly completely healed with a small thin scab noted on photo that was sent through [facility].  I recommend continuing with Vaseline dressing changes until the scab is completely healed.  Is no longer necessary to keep this covered. I recommend following up on an as-needed basis.  There is no  sign of infection based on the photos and patient is feeling well and has no concerns.  A total of 10 minutes was spent on today's encounter which included reviewing patient history, reviewing EMR photos, taking a history, documenting patient encounter.   Carola Rhine Alexandrina Fiorini 07/17/2020, 8:05 AM

## 2020-07-18 ENCOUNTER — Telehealth: Payer: Medicare HMO | Admitting: Surgical

## 2020-07-18 ENCOUNTER — Other Ambulatory Visit: Payer: Self-pay | Admitting: Nurse Practitioner

## 2020-07-19 ENCOUNTER — Other Ambulatory Visit: Payer: Self-pay | Admitting: Nurse Practitioner

## 2020-07-22 ENCOUNTER — Ambulatory Visit: Payer: Medicare HMO | Admitting: Podiatry

## 2020-07-23 ENCOUNTER — Ambulatory Visit (HOSPITAL_COMMUNITY)
Admission: RE | Admit: 2020-07-23 | Discharge: 2020-07-23 | Disposition: A | Discharge status: Home / Self Care | Payer: Medicare HMO | Source: Ambulatory Visit | Attending: Gastroenterology | Admitting: Gastroenterology

## 2020-07-23 ENCOUNTER — Other Ambulatory Visit: Payer: Self-pay | Admitting: Nurse Practitioner

## 2020-07-23 ENCOUNTER — Other Ambulatory Visit: Payer: Self-pay

## 2020-07-23 DIAGNOSIS — K839 Disease of biliary tract, unspecified: Secondary | ICD-10-CM | POA: Diagnosis not present

## 2020-07-23 DIAGNOSIS — K5790 Diverticulosis of intestine, part unspecified, without perforation or abscess without bleeding: Secondary | ICD-10-CM | POA: Diagnosis not present

## 2020-07-23 DIAGNOSIS — K838 Other specified diseases of biliary tract: Secondary | ICD-10-CM | POA: Diagnosis not present

## 2020-07-23 DIAGNOSIS — K7689 Other specified diseases of liver: Secondary | ICD-10-CM | POA: Diagnosis not present

## 2020-07-23 DIAGNOSIS — N281 Cyst of kidney, acquired: Secondary | ICD-10-CM | POA: Diagnosis not present

## 2020-07-23 LAB — POCT I-STAT CREATININE: Creatinine, Ser: 0.6 mg/dL (ref 0.44–1.00)

## 2020-07-23 MED ORDER — ALPRAZOLAM 0.25 MG PO TABS: 0.2500 mg | ORAL_TABLET | Freq: Three times a day (TID) | ORAL | 0 refills | Status: DC

## 2020-07-23 MED ORDER — SODIUM CHLORIDE (PF) 0.9 % IJ SOLN
INTRAMUSCULAR | Status: AC
  Filled 2020-07-23: qty 50

## 2020-07-23 MED ORDER — IOHEXOL 300 MG/ML  SOLN
75.0000 mL | Freq: Once | INTRAMUSCULAR | Status: AC | PRN
  Administered 2020-07-23: 75 mL via INTRAVENOUS

## 2020-07-23 NOTE — Telephone Encounter (Signed)
She is scheduled to be seen 08/18. Can we place a reminder in chart to have narcotic contract signed.

## 2020-07-23 NOTE — Telephone Encounter (Signed)
Pharmacy requested refill Epic LR: 06/26/20 No Contract on File, added to upcoming appointment.  Pended Rx and sent to [facility] for approval.

## 2020-07-29 ENCOUNTER — Ambulatory Visit: Payer: Medicare HMO | Admitting: Podiatry

## 2020-07-30 ENCOUNTER — Telehealth: Payer: Self-pay

## 2020-07-30 NOTE — Telephone Encounter (Signed)
Patient's granddaughter calls today to report bruising on patient's left lower leg. She had a hematoma in November. It burns a little, but the area is not hot, and leg is normal temperature. She sent a picture of the leg - shows deep purple discoloration, but the leg appears flat. Discussed with provider per granddaughter request, patient does not need to be seen. Advised them to call back if swelling underneath the skin developed or patients leg turned cold. In the meantime, they can try ice and rest. They have a follow up with PCP scheduled.

## 2020-08-07 ENCOUNTER — Other Ambulatory Visit: Payer: Self-pay

## 2020-08-07 ENCOUNTER — Ambulatory Visit (INDEPENDENT_AMBULATORY_CARE_PROVIDER_SITE_OTHER): Payer: Medicare HMO | Admitting: Podiatry

## 2020-08-07 DIAGNOSIS — M79674 Pain in right toe(s): Secondary | ICD-10-CM | POA: Diagnosis not present

## 2020-08-07 DIAGNOSIS — B351 Tinea unguium: Secondary | ICD-10-CM

## 2020-08-07 DIAGNOSIS — M79675 Pain in left toe(s): Secondary | ICD-10-CM | POA: Diagnosis not present

## 2020-08-07 DIAGNOSIS — G629 Polyneuropathy, unspecified: Secondary | ICD-10-CM

## 2020-08-08 ENCOUNTER — Encounter: Payer: Self-pay | Admitting: Orthopedic Surgery

## 2020-08-09 ENCOUNTER — Other Ambulatory Visit: Payer: Self-pay | Admitting: Family Medicine

## 2020-08-11 MED ORDER — DULOXETINE HCL 30 MG PO CPEP: 30.0000 mg | ORAL_CAPSULE | Freq: Every evening | ORAL | 1 refills | Status: DC

## 2020-08-11 NOTE — Telephone Encounter (Signed)
RePended Rx and sent to [facility] for approval due to [facility].  Problem with 1st submission.

## 2020-08-12 DIAGNOSIS — H903 Sensorineural hearing loss, bilateral: Secondary | ICD-10-CM | POA: Diagnosis not present

## 2020-08-12 NOTE — Progress Notes (Signed)
Subjective: 87 y.o. returns the office today for painful, elongated, thickened toenails which she cannot trim herself. Denies any redness or drainage around the nails.  She keeps a wrap on the left leg as she has bruising.  Denies any open lesions.  Denies any systemic complaints such as fevers, chills, nausea, vomiting.   PCP: Wendie Agreste, MD   Objective: NAD DP/PT pulses palpable, CRT less than 3 seconds Nails hypertrophic, dystrophic, elongated, brittle, discolored 10. There is tenderness overlying the nails 1-5 bilaterally. There is no surrounding erythema or drainage along the nail sites. No pain with calf compression, swelling, warmth, erythema.  Assessment: Patient presents with symptomatic onychomycosis  Plan: -Treatment options including alternatives, risks, complications were discussed -Nails sharply debrided 10 without complication/bleeding. -Continue Cymbalta for neuropathy -Discussed daily foot inspection. If there are any changes, to call the office immediately.  -She has bruising to the leg easily.  Encourage compression, elevation.  Monitor any skin breakdown. -Follow-up in 3 months or sooner if any problems are to arise. In the meantime, encouraged to call the office with any questions, concerns, changes symptoms.  Celesta Gentile, DPM

## 2020-08-20 DIAGNOSIS — M19011 Primary osteoarthritis, right shoulder: Secondary | ICD-10-CM | POA: Diagnosis not present

## 2020-08-22 ENCOUNTER — Other Ambulatory Visit: Payer: Self-pay | Admitting: Orthopedic Surgery

## 2020-08-23 ENCOUNTER — Other Ambulatory Visit: Payer: Self-pay | Admitting: Orthopedic Surgery

## 2020-08-25 NOTE — Telephone Encounter (Signed)
Patient has request refill on medication "Xanax 0.'25mg'$ ". Patient last refill was on 07/23/2020 with 90 tablets to be taken three times daily. Medication pend and sent to PCP [name], NP for approval. Please Advise.

## 2020-08-25 NOTE — Telephone Encounter (Signed)
Patient has request refill on medication "Xanax 0.'25mg'$ ". Patient last refill was 07/23/2020 with 90 tablets to be taken three times daily. Patient medication pend and sent to PCP [name], NP for approval. Please Advise.

## 2020-09-10 ENCOUNTER — Ambulatory Visit: Payer: Medicare HMO | Admitting: Gastroenterology

## 2020-09-10 DIAGNOSIS — M7061 Trochanteric bursitis, right hip: Secondary | ICD-10-CM | POA: Diagnosis not present

## 2020-09-10 DIAGNOSIS — M7062 Trochanteric bursitis, left hip: Secondary | ICD-10-CM | POA: Diagnosis not present

## 2020-09-11 ENCOUNTER — Encounter: Payer: Self-pay | Admitting: Orthopedic Surgery

## 2020-09-11 ENCOUNTER — Ambulatory Visit (INDEPENDENT_AMBULATORY_CARE_PROVIDER_SITE_OTHER): Payer: Medicare HMO | Admitting: Orthopedic Surgery

## 2020-09-11 ENCOUNTER — Other Ambulatory Visit: Payer: Self-pay

## 2020-09-11 VITALS — BP 110/60 | HR 96 | Temp 98.0°F | Ht 59.0 in | Wt 113.4 lb

## 2020-09-11 DIAGNOSIS — R630 Anorexia: Secondary | ICD-10-CM

## 2020-09-11 DIAGNOSIS — E039 Hypothyroidism, unspecified: Secondary | ICD-10-CM | POA: Diagnosis not present

## 2020-09-11 DIAGNOSIS — S81802D Unspecified open wound, left lower leg, subsequent encounter: Secondary | ICD-10-CM

## 2020-09-11 DIAGNOSIS — G629 Polyneuropathy, unspecified: Secondary | ICD-10-CM

## 2020-09-11 DIAGNOSIS — I10 Essential (primary) hypertension: Secondary | ICD-10-CM

## 2020-09-11 DIAGNOSIS — K5901 Slow transit constipation: Secondary | ICD-10-CM

## 2020-09-11 DIAGNOSIS — E782 Mixed hyperlipidemia: Secondary | ICD-10-CM | POA: Diagnosis not present

## 2020-09-11 DIAGNOSIS — J302 Other seasonal allergic rhinitis: Secondary | ICD-10-CM | POA: Diagnosis not present

## 2020-09-11 DIAGNOSIS — Z23 Encounter for immunization: Secondary | ICD-10-CM | POA: Diagnosis not present

## 2020-09-11 DIAGNOSIS — G894 Chronic pain syndrome: Secondary | ICD-10-CM | POA: Diagnosis not present

## 2020-09-11 MED ORDER — DULOXETINE HCL 30 MG PO CPEP: 60.0000 mg | ORAL_CAPSULE | Freq: Every day | ORAL | 3 refills | Status: DC

## 2020-09-11 NOTE — Patient Instructions (Addendum)
Please get flu vaccine by October 31 st 2022.   Please consider getting tetanus and shingles vaccine.   New prescription Cymbalta.

## 2020-09-11 NOTE — Progress Notes (Signed)
Careteam: Patient Care Team: [name], NP as PCP - General (Adult Health Nurse Practitioner)  Seen by: Windell Moulding, AGNP-C  PLACE OF SERVICE:  [place]  Advanced Directive information    Allergies  Allergen Reactions   Brimonidine    Codeine Other (See Comments)   Levofloxacin Other (See Comments)   Lidocaine    Lipitor [Atorvastatin]    Meloxicam    Myrbetriq [name]]    Oxycodone    Pregabalin    Sulfa Antibiotics Other (See Comments)   Timolol Maleate    Zolpidem    Gabapentin Rash    Pt did not like the way it made her feel    Chief Complaint  Patient presents with   Medical Management of Chronic Issues    Patient presents today for a 4 month follow-up.   Quality Metric Gaps    Per care gap in Epic patient is due for DEXA scan, Zoster, PPSV23, Tetanus/TDAP, Flu and Covid vaccines.     HPI: Patient is a 87 y.o. female seen today for medical management of chronic conditions.   Granddaughter present during encounter today.   Polyneuropathy- ongoing, pain worse in left leg. Continues to take prn tylenol, averages one daily. Takes tramadol and cymbalta daily.   08/07/2020 seen by Dr. Jacqualyn Posey, toenails trimmed, recommended daily foot inspections and compression/elevation to lower legs to help with bruising. Wound to left lower extremity healed. Contiues to use lotion to skin daily for protection.   Eating about 2 meals a day and snacks. Drinks ensure daily, sometimes twice daily.   Constipation- dependent on stool softener and castor oil prn. LBM today. Admits to using a suppository this morning to help her go.   No recent falls. Uses rollator and cane at home. Wheelchair for long distances.   Plans to get flu vaccine.   Shingles vaccine- unknown if she has had vaccine.   Tdap- does not think she has had.      Review of Systems:  Review of Systems  Constitutional:  Negative for chills, fever, malaise/fatigue and weight loss.  HENT:   Negative for hearing loss and sore throat.   Eyes:  Negative for blurred vision and double vision.       Glasses  Respiratory:  Negative for cough, shortness of breath and wheezing.   Cardiovascular:  Negative for chest pain and leg swelling.  Gastrointestinal:  Positive for constipation and heartburn. Negative for abdominal pain, blood in stool, diarrhea, nausea and vomiting.  Genitourinary:  Negative for dysuria and frequency.  Musculoskeletal:  Positive for joint pain and myalgias. Negative for falls.  Skin:        Discoloration to lower legs  Neurological:  Positive for tingling and weakness. Negative for dizziness and headaches.  Psychiatric/Behavioral:  Negative for depression and memory loss. The patient is nervous/anxious. The patient does not have insomnia.    Past Medical History:  Diagnosis Date   Anxiety    Arthritis    Phreesia 03/05/2020   Bowel obstruction (HCC) 1950   Bursitis    Cancer of septum of nose (HCC)    melanoma   Constipation    At times   Depression    GERD (gastroesophageal reflux disease)    History of open leg wound    Hypertension    Hypothyroidism    Osteoporosis    Phreesia 03/05/2020   Swallowing difficulty    Thyroid activity decreased    Torn rotator cuff  Past Surgical History:  Procedure Laterality Date   ABDOMINAL HYSTERECTOMY N/A    Phreesia [id number]   APPLICATION OF A-CELL OF EXTREMITY Left 10/17/2019   Procedure: APPLICATION OF INTEGRA BILAYER WOUND MATRIX OF EXTREMITY;  Surgeon: Wallace Going, DO;  Location: [facility];  Service: Plastics;  Laterality: Left;   BIOPSY  12/07/2017   Procedure: BIOPSY;  Surgeon: Rush Landmark Telford Nab., MD;  Location: [facility];  Service: Gastroenterology;;   CHOLECYSTECTOMY     ESOPHAGOGASTRODUODENOSCOPY (EGD) WITH PROPOFOL N/A 12/07/2017   Procedure: ESOPHAGOGASTRODUODENOSCOPY (EGD) WITH PROPOFOL;  Surgeon: Irving Copas., MD;  Location: [facility];   Service: Gastroenterology;  Laterality: N/A;  May need Dilation   HERNIA REPAIR N/A    Phreesia 03/05/2020   hysterecrtomy     I & D EXTREMITY Left 10/17/2019   Procedure: IRRIGATION AND DEBRIDEMENT EXTREMITY;  Surgeon: Wallace Going, DO;  Location: [facility];  Service: Plastics;  Laterality: Left;  45 min   JOINT REPLACEMENT N/A    Phreesia 03/05/2020   left hip surgery     ROTATOR CUFF REPAIR Right    SMALL INTESTINE SURGERY     Social History:   reports that she has never smoked. She has never used smokeless tobacco. She reports that she does not drink alcohol and does not use drugs.  Family History  Problem Relation Age of Onset   Uterine cancer Mother    Hypertension Father    Kidney failure Brother    Cancer Son    Cancer Daughter    Colon cancer Neg Hx    Esophageal cancer Neg Hx    Liver disease Neg Hx    Inflammatory bowel disease Neg Hx    Pancreatic cancer Neg Hx    Rectal cancer Neg Hx    Stomach cancer Neg Hx     Medications: Patient's Medications  New Prescriptions   No medications on file  Previous Medications   ACETAMINOPHEN (TYLENOL) 325 MG TABLET    Take 325 mg by mouth as needed.   ACETAMINOPHEN (TYLENOL) 500 MG TABLET    Take 500 mg by mouth as needed.   ALPRAZOLAM (XANAX) 0.25 MG TABLET    TAKE 1 TABLET BY MOUTH 3 TIMES DAILY   CASTOR OIL LIQUID    Take 15 mLs by mouth as needed.   CETIRIZINE (ZYRTEC) 10 MG TABLET    TAKE 1 TABLET BY MOUTH EVERY DAY   CYANOCOBALAMIN 1000 MCG/ML LIQD    Take 1 tablet by mouth daily in the afternoon.   DOCUSATE SODIUM (COLACE) 100 MG CAPSULE    Take 100 mg by mouth as needed.   DULOXETINE (CYMBALTA) 30 MG CAPSULE    Take 1 capsule (30 mg total) by mouth at bedtime.   ESTROGENS, CONJUGATED (PREMARIN VA)    Place 1 application vaginally daily.   FERROUS SULFATE 325 (65 FE) MG EC TABLET    Take 325 mg by mouth. Every other week   GINKGO BILOBA 120 MG CAPS    Take 120 mg by mouth 2 (two) times a week.    LACTOBACILLUS (PROBIOTIC ACIDOPHILUS PO)    Take 1 capsule by mouth daily.   LEVOTHYROXINE (SYNTHROID) 75 MCG TABLET    Take 1 tablet (75 mcg total) by mouth daily.   LOPERAMIDE (IMODIUM) 2 MG CAPSULE    Take by mouth as needed for diarrhea or loose stools.   LOSARTAN (COZAAR) 100 MG TABLET    Take 1 tablet (100 mg  total) by mouth daily.   MAGNESIUM GLUCONATE 27.5 MG TABS    Take 500 mg by mouth daily in the afternoon.   MENTHOL, TOPICAL ANALGESIC, 154 MG PADS    Place 1 each onto the skin daily as needed (for hip pain.).   NON FORMULARY    MV-MIN-Folic Acid-Lutein 99991111 mcg Chew take by mouth   NON FORMULARY    Ubidecarenone-Omega 3-vit E 25-150-200   NUTRITIONAL SUPPLEMENTS (FEEDING SUPPLEMENT, PEDIASURE 1.5,) LIQD LIQUID    Take 237 mLs by mouth 2 (two) times daily between meals.   PANTOPRAZOLE (PROTONIX) 40 MG TABLET    Take 1 tablet (40 mg total) by mouth 2 (two) times daily.   PROLIA 60 MG/ML SOSY INJECTION    Take 60 mg by mouth every 6 (six) months.   PUMPKIN SEED-SOY GERM (AZO BLADDER CONTROL/GO-LESS PO)    Take 1 tablet by mouth daily as needed (for bladder pain.).   TRAMADOL (ULTRAM) 50 MG TABLET    TAKE 1 TABLET BY MOUTH 3 TIMES DAILY as needed for pain   TROLAMINE SALICYLATE (ASPERCREME) 10 % CREAM    Apply 1 application topically as needed for muscle pain.  Modified Medications   No medications on file  Discontinued Medications   No medications on file    Physical Exam:  Vitals:   09/11/20 1309  BP: 110/60  Pulse: 96  Temp: 98 F (36.7 C)  TempSrc: Temporal  SpO2: 98%  Weight: 113 lb 6.4 oz (51.4 kg)  Height: '4\' 11"'$  (1.499 m)   Body mass index is 22.9 kg/m. Wt Readings from Last 3 Encounters:  09/11/20 113 lb 6.4 oz (51.4 kg)  06/10/20 110 lb (49.9 kg)  05/01/20 112 lb 6.4 oz (51 kg)    Physical Exam Vitals reviewed.  Constitutional:      General: She is not in acute distress. HENT:     Head: Normocephalic.     Right Ear: There is no impacted cerumen.      Left Ear: There is no impacted cerumen.     Nose: Nose normal.     Mouth/Throat:     Mouth: Mucous membranes are moist.  Eyes:     General:        Right eye: No discharge.        Left eye: No discharge.  Neck:     Vascular: No carotid bruit.  Cardiovascular:     Rate and Rhythm: Normal rate and regular rhythm.     Pulses: Normal pulses.     Heart sounds: Normal heart sounds. No murmur heard. Pulmonary:     Effort: Pulmonary effort is normal. No respiratory distress.     Breath sounds: Normal breath sounds. No wheezing.  Musculoskeletal:     Cervical back: Normal range of motion.     Right lower leg: No edema.     Left lower leg: No edema.  Lymphadenopathy:     Cervical: No cervical adenopathy.  Skin:    General: Skin is warm and dry.     Capillary Refill: Capillary refill takes less than 2 seconds.     Comments: Mild brown/purple discoloration to lower legs. Left anterior shin abrasion healed.   Neurological:     General: No focal deficit present.     Mental Status: She is alert and oriented to person, place, and time.     Motor: Weakness present.     Gait: Gait abnormal.     Comments: Walker/wheelchair  Psychiatric:  Mood and Affect: Mood normal.        Behavior: Behavior normal.    Labs reviewed: Basic Metabolic Panel: Recent Labs    10/17/19 0842 01/10/20 1617 04/29/20 0000 07/23/20 1245  NA 130* 135 130*  --   K 3.6 4.9 4.6  --   CL 91* 96 91*  --   CO2 28 28 24*  --   GLUCOSE 99 78  --   --   BUN '12 19 21  '$ --   CREATININE 0.66 0.68 0.6 0.60  CALCIUM 8.6* 9.4 9.4  --   TSH  --  1.100  --   --    Liver Function Tests: Recent Labs    01/10/20 1617 04/29/20 0000  AST 16 20  ALT 9 8  ALKPHOS 79 81  BILITOT 0.4  --   PROT 6.5  --   ALBUMIN 4.0 4.1   No results for input(s): LIPASE, AMYLASE in the last 8760 hours. No results for input(s): AMMONIA in the last 8760 hours. CBC: Recent Labs    04/29/20 0000  WBC 5.2  HGB 12.1  HCT 37   PLT 159   Lipid Panel: No results for input(s): CHOL, HDL, LDLCALC, TRIG, CHOLHDL, LDLDIRECT in the last 8760 hours. TSH: Recent Labs    01/10/20 1617  TSH 1.100   A1C: No results found for: HGBA1C   Assessment/Plan 1. Essential hypertension - controlled - cont losartan - CBC with Differential/Platelet; Future - CMP- BUN/creat 17/0.58 09/11/2020  2. Mixed hyperlipidemia - LDL 144 06/26/2019 - not on statin at this time  3. Slow transit constipation - LBM today, needed suppository  - cont colace prn and castor oil - recommend daily hydration with water - may recommend senna if she continues suppository use  4. Poor appetite - she has gained 3 lbs since last visit - cont ensure daily  5. Seasonal allergies - cont zyrtec  6. Wound of left lower extremity, subsequent encounter - resolved, lesion healed   7. Acquired hypothyroidism - TSH 1.10 01/10/2020 - cont levothyroxine  - TSH - future  8. Chronic pain syndrome - reports polyneuropathy most troublesome at this time - cont Tramadol 50 mg tid prn - DULoxetine (CYMBALTA) 30 MG capsule; Take 2 capsules (60 mg total) by mouth daily.  Dispense: 60 capsule; Refill: 3  9. Need for Tdap vaccination - patient education done  10. Polyneuropathy - reports burning of legs, left worse than right - cont Cymbalta 60 mg po daily  Total time: 38 minutes. Greater than 50 % of total time spent doing patient education on health maintenance, vaccinations, and symptoms management.   Next appt: Visit date not found  [place], [facility] [phone]

## 2020-09-12 LAB — COMPREHENSIVE METABOLIC PANEL
AG Ratio: 1.8 (calc) (ref 1.0–2.5)
ALT: 8 U/L (ref 6–29)
AST: 16 U/L (ref 10–35)
Albumin: 3.9 g/dL (ref 3.6–5.1)
Alkaline phosphatase (APISO): 54 U/L (ref 37–153)
BUN/Creatinine Ratio: 29 (calc) — ABNORMAL HIGH (ref 6–22)
BUN: 17 mg/dL (ref 7–25)
CO2: 26 mmol/L (ref 20–32)
Calcium: 9.3 mg/dL (ref 8.6–10.4)
Chloride: 95 mmol/L — ABNORMAL LOW (ref 98–110)
Creat: 0.58 mg/dL — ABNORMAL LOW (ref 0.60–0.95)
Globulin: 2.2 g/dL (calc) (ref 1.9–3.7)
Glucose, Bld: 101 mg/dL (ref 65–139)
Potassium: 4.3 mmol/L (ref 3.5–5.3)
Sodium: 129 mmol/L — ABNORMAL LOW (ref 135–146)
Total Bilirubin: 0.5 mg/dL (ref 0.2–1.2)
Total Protein: 6.1 g/dL (ref 6.1–8.1)

## 2020-09-12 LAB — CBC WITH DIFFERENTIAL/PLATELET
Absolute Monocytes: 332 cells/uL (ref 200–950)
Basophils Absolute: 10 cells/uL (ref 0–200)
Basophils Relative: 0.2 %
Eosinophils Absolute: 0 cells/uL — ABNORMAL LOW (ref 15–500)
Eosinophils Relative: 0 %
HCT: 35.2 % (ref 35.0–45.0)
Hemoglobin: 12 g/dL (ref 11.7–15.5)
Lymphs Abs: 469 cells/uL — ABNORMAL LOW (ref 850–3900)
MCH: 34.5 pg — ABNORMAL HIGH (ref 27.0–33.0)
MCHC: 34.1 g/dL (ref 32.0–36.0)
MCV: 101.1 fL — ABNORMAL HIGH (ref 80.0–100.0)
MPV: 11 fL (ref 7.5–12.5)
Monocytes Relative: 6.5 %
Neutro Abs: 4289 cells/uL (ref 1500–7800)
Neutrophils Relative %: 84.1 %
Platelets: 181 10*3/uL (ref 140–400)
RBC: 3.48 10*6/uL — ABNORMAL LOW (ref 3.80–5.10)
RDW: 12.5 % (ref 11.0–15.0)
Total Lymphocyte: 9.2 %
WBC: 5.1 10*3/uL (ref 3.8–10.8)

## 2020-09-18 ENCOUNTER — Encounter: Payer: Self-pay | Admitting: Orthopedic Surgery

## 2020-09-18 ENCOUNTER — Other Ambulatory Visit: Payer: Self-pay | Admitting: Orthopedic Surgery

## 2020-09-18 DIAGNOSIS — K219 Gastro-esophageal reflux disease without esophagitis: Secondary | ICD-10-CM

## 2020-09-19 MED ORDER — PANTOPRAZOLE SODIUM 40 MG PO TBEC: 40.0000 mg | DELAYED_RELEASE_TABLET | Freq: Two times a day (BID) | ORAL | 0 refills | Status: DC

## 2020-09-19 MED ORDER — ALPRAZOLAM 0.25 MG PO TABS: 0.2500 mg | ORAL_TABLET | Freq: Three times a day (TID) | ORAL | 0 refills | Status: DC

## 2020-09-19 NOTE — Telephone Encounter (Signed)
Patient requested refills. Pended and sent to Amy for approval.  Epic LR: 08/25/2020

## 2020-09-22 ENCOUNTER — Other Ambulatory Visit: Payer: Self-pay | Admitting: Orthopedic Surgery

## 2020-09-22 DIAGNOSIS — F419 Anxiety disorder, unspecified: Secondary | ICD-10-CM

## 2020-09-22 DIAGNOSIS — G894 Chronic pain syndrome: Secondary | ICD-10-CM

## 2020-09-22 MED ORDER — DULOXETINE HCL 60 MG PO CPEP: 60.0000 mg | ORAL_CAPSULE | Freq: Every day | ORAL | 3 refills | Status: DC

## 2020-09-22 MED ORDER — ALPRAZOLAM 0.25 MG PO TABS: 0.2500 mg | ORAL_TABLET | Freq: Three times a day (TID) | ORAL | 2 refills | Status: DC

## 2020-09-23 DIAGNOSIS — R399 Unspecified symptoms and signs involving the genitourinary system: Secondary | ICD-10-CM | POA: Diagnosis not present

## 2020-10-15 ENCOUNTER — Encounter: Payer: Self-pay | Admitting: Gastroenterology

## 2020-10-15 ENCOUNTER — Other Ambulatory Visit: Payer: Self-pay

## 2020-10-15 ENCOUNTER — Ambulatory Visit (HOSPITAL_COMMUNITY)
Admission: EM | Admit: 2020-10-15 | Discharge: 2020-10-15 | Disposition: A | Discharge status: Home / Self Care | Payer: Medicare HMO | Attending: Family Medicine | Admitting: Family Medicine

## 2020-10-15 ENCOUNTER — Ambulatory Visit (INDEPENDENT_AMBULATORY_CARE_PROVIDER_SITE_OTHER): Payer: Medicare HMO | Admitting: Gastroenterology

## 2020-10-15 VITALS — BP 94/60 | HR 86 | Ht 59.0 in | Wt 110.0 lb

## 2020-10-15 DIAGNOSIS — R140 Abdominal distension (gaseous): Secondary | ICD-10-CM

## 2020-10-15 DIAGNOSIS — K219 Gastro-esophageal reflux disease without esophagitis: Secondary | ICD-10-CM

## 2020-10-15 DIAGNOSIS — K5900 Constipation, unspecified: Secondary | ICD-10-CM

## 2020-10-15 DIAGNOSIS — S81812A Laceration without foreign body, left lower leg, initial encounter: Secondary | ICD-10-CM

## 2020-10-15 DIAGNOSIS — S40811A Abrasion of right upper arm, initial encounter: Secondary | ICD-10-CM | POA: Diagnosis not present

## 2020-10-15 DIAGNOSIS — R1310 Dysphagia, unspecified: Secondary | ICD-10-CM

## 2020-10-15 DIAGNOSIS — R1319 Other dysphagia: Secondary | ICD-10-CM | POA: Diagnosis not present

## 2020-10-15 MED ORDER — MUPIROCIN 2 % EX OINT: 1.0000 "application " | TOPICAL_OINTMENT | Freq: Two times a day (BID) | CUTANEOUS | 1 refills | Status: DC

## 2020-10-15 NOTE — ED Triage Notes (Signed)
Patient c/o Fall that happened today.   Patient states " I was trying to get from wheelchair to car and I fell".   Patient sustained laceration to RT leg.   Patient denies LOC.

## 2020-10-15 NOTE — Discharge Instructions (Addendum)
I recommend getting in to see wound care as soon as possible.

## 2020-10-15 NOTE — Patient Instructions (Signed)
Continue Miralax every other day.   Continue using Stool Softeners.   Continue taking your Pantoprazole twice daily.   You have been referred to Speech Therapy. You will be contacted by them to schedule an appointment.   If you are age 86 or older, your body mass index should be between 23-30. Your Body mass index is 22.22 kg/m. If this is out of the aforementioned range listed, please consider follow up with your Primary Care Provider.  If you are age 31 or younger, your body mass index should be between 19-25. Your Body mass index is 22.22 kg/m. If this is out of the aformentioned range listed, please consider follow up with your Primary Care Provider.   __________________________________________________________  The [facility] providers would like to encourage you to use [facility] to communicate with providers for non-urgent requests or questions.  Due to long hold times on the telephone, sending your provider a message by [facility] may be a faster and more efficient way to get a response.  Please allow 48 business hours for a response.  Please remember that this is for non-urgent requests.   You will need a follow-up in 4 months. Office will contact you to schedule.   Thank you for choosing me and [facility].  Dr. Rush Landmark

## 2020-10-15 NOTE — Progress Notes (Signed)
[place] VISIT   Primary Care Provider [place], Mervyn Gay, NP [phone] [address] [phone]  Patient Profile: Kayla Lloyd is a 87 y.o. female with a pmh significant for hypertension, hypothyroidism, prior bowel obstruction (no resection many years ago), melanoma of the nose, status post cholecystectomy, status post hysterectomy, sleep apnea, GERD, gastritis, chronic constipation, biliary dilation, chronic dysphagia.  The patient presents to the [facility] for an evaluation and management of problem(s) noted below:  Problem List 1. Other dysphagia   2. Gastroesophageal reflux disease without esophagitis   3. Bloated abdomen   4. Constipation, unspecified constipation type     History of Present Illness Please see prior notes for full details of HPI.  Interval History The patient returns for scheduled follow-up today.  She is accompanied by her granddaughter who is her primary caretaker.  Still having issues from a dysphagia perspective per her report but her granddaughter states things are much better with swallowing since she increased her PPI back to twice daily.  She is eating her report.  She denies significant anorexia.  No nausea or vomiting.  Pills still causing issues at times with a sensation of being caught at the top of the esophagus at times as well as sometimes in the middle of the chest.  They are not interested in endoscopic reevaluation currently unless something changes dramatically.  She does not follow the previous SLP evaluation but is interested in seeing them again if possible.  She has dealt with her chronic constipation for a while and has not noted a significant alteration of bowel habits.  She takes stool softeners twice a day.  She does still have bloating at times but it is not clear that if she moves her bowels more frequently if she has any clinical improvement.  She does still take  Gas-X on an at least once daily basis.  GI Review of Systems Positive as above Negative for nausea, vomiting, melena, hematochezia  Review of Systems General: Denies fevers/chills/unintentional weight loss Cardiovascular: Denies chest pain Pulmonary: Denies shortness of breath Gastroenterological: See HPI Genitourinary: Denies darkened urine Hematological: Positive for easy bruising/bleeding Dermatological: Denies jaundice Psychological: Mood is stable   Medications Current Outpatient Medications  Medication Sig Dispense Refill   acetaminophen (TYLENOL) 325 MG tablet Take 325 mg by mouth as needed.     acetaminophen (TYLENOL) 500 MG tablet Take 500 mg by mouth as needed.     ALPRAZolam (XANAX) 0.25 MG tablet Take 1 tablet (0.25 mg total) by mouth 3 (three) times daily. 90 tablet 2   castor oil liquid Take 15 mLs by mouth as needed.     cetirizine (ZYRTEC) 10 MG tablet TAKE 1 TABLET BY MOUTH EVERY DAY 90 tablet 3   Cyanocobalamin 1000 MCG/ML LIQD Take 1 tablet by mouth daily in the afternoon.     docusate sodium (COLACE) 100 MG capsule Take 100 mg by mouth as needed.     Estrogens, Conjugated (PREMARIN VA) Place 1 application vaginally daily.     ferrous sulfate 325 (65 FE) MG EC tablet Take 325 mg by mouth. Every other week     Ginkgo Biloba 120 MG CAPS Take 120 mg by mouth 2 (two) times a week.     Lactobacillus (PROBIOTIC ACIDOPHILUS PO) Take 1 capsule by mouth daily.     levothyroxine (SYNTHROID) 75 MCG tablet Take 1 tablet (75 mcg total) by mouth daily. 90 tablet 1   loperamide (IMODIUM) 2  MG capsule Take by mouth as needed for diarrhea or loose stools.     losartan (COZAAR) 100 MG tablet Take 1 tablet (100 mg total) by mouth daily. 90 tablet 1   Magnesium Gluconate 27.5 MG TABS Take 500 mg by mouth daily in the afternoon.     Menthol, Topical Analgesic, 154 MG PADS Place 1 each onto the skin daily as needed (for hip pain.).     NON FORMULARY MV-MIN-Folic Acid-Lutein 314-970  mcg Chew take by mouth     NON FORMULARY Ubidecarenone-Omega 3-vit E 25-150-200     Nutritional Supplements (FEEDING SUPPLEMENT, PEDIASURE 1.5,) LIQD liquid Take 237 mLs by mouth 2 (two) times daily between meals. 237 mL 6   pantoprazole (PROTONIX) 40 MG tablet Take 1 tablet (40 mg total) by mouth 2 (two) times daily. 180 tablet 0   PROLIA 60 MG/ML SOSY injection Take 60 mg by mouth every 6 (six) months.  0   Pumpkin Seed-Soy Germ (AZO BLADDER CONTROL/GO-LESS PO) Take 1 tablet by mouth daily as needed (for bladder pain.).     traMADol (ULTRAM) 50 MG tablet TAKE 1 TABLET BY MOUTH 3 TIMES DAILY as needed for pain 60 tablet 1   trolamine salicylate (ASPERCREME) 10 % cream Apply 1 application topically as needed for muscle pain.     DULoxetine (CYMBALTA) 60 MG capsule Take 60 mg by mouth 2 (two) times daily.     mupirocin ointment (BACTROBAN) 2 % Apply 1 application topically 2 (two) times daily. 22 g 1   No current facility-administered medications for this visit.    Allergies Allergies  Allergen Reactions   Brimonidine    Codeine Other (See Comments)   Levofloxacin Other (See Comments)   Lidocaine    Lipitor [Atorvastatin]    Meloxicam    Myrbetriq [name]]    Oxycodone    Pregabalin    Sulfa Antibiotics Other (See Comments)   Timolol Maleate    Zolpidem    Gabapentin Rash    Pt did not like the way it made her feel    Histories Past Medical History:  Diagnosis Date   Anxiety    Arthritis    Phreesia 03/05/2020   Bowel obstruction (HCC) 1950   Bursitis    Cancer of septum of nose (HCC)    melanoma   Constipation    At times   Depression    GERD (gastroesophageal reflux disease)    History of open leg wound    Hypertension    Hypothyroidism    Osteoporosis    Phreesia 03/05/2020   Swallowing difficulty    Thyroid activity decreased    Torn rotator cuff    Past Surgical History:  Procedure Laterality Date   ABDOMINAL HYSTERECTOMY N/A    Phreesia  26/37/8588   APPLICATION OF A-CELL OF EXTREMITY Left 10/17/2019   Procedure: APPLICATION OF INTEGRA BILAYER WOUND MATRIX OF EXTREMITY;  Surgeon: Wallace Going, DO;  Location: [facility];  Service: Plastics;  Laterality: Left;   BIOPSY  12/07/2017   Procedure: BIOPSY;  Surgeon: Rush Landmark Telford Nab., MD;  Location: [facility];  Service: Gastroenterology;;   CHOLECYSTECTOMY     ESOPHAGOGASTRODUODENOSCOPY (EGD) WITH PROPOFOL N/A 12/07/2017   Procedure: ESOPHAGOGASTRODUODENOSCOPY (EGD) WITH PROPOFOL;  Surgeon: Irving Copas., MD;  Location: [facility];  Service: Gastroenterology;  Laterality: N/A;  May need Dilation   HERNIA REPAIR N/A    Phreesia 03/05/2020   hysterecrtomy     I & D EXTREMITY Left  10/17/2019   Procedure: IRRIGATION AND DEBRIDEMENT EXTREMITY;  Surgeon: Wallace Going, DO;  Location: [facility];  Service: Plastics;  Laterality: Left;  45 min   JOINT REPLACEMENT N/A    Phreesia 03/05/2020   left hip surgery     ROTATOR CUFF REPAIR Right    SMALL INTESTINE SURGERY     Social History   Socioeconomic History   Marital status: Widowed    Spouse name: Not on file   Number of children: 8   Years of education: Not on file   Highest education level: Not on file  Occupational History   Not on file  Tobacco Use   Smoking status: Never   Smokeless tobacco: Never  Vaping Use   Vaping Use: Never used  Substance and Sexual Activity   Alcohol use: No    Comment: Red wine , 1 glass, 1-2 times a week   Drug use: No   Sexual activity: Not Currently  Other Topics Concern   Not on file  Social History Narrative   Diet:No       Do you drink/ eat things with caffeine? Rarely Green Tea      Marital status: Widowed                              What year were you married ? 1944      Do you live in a house, apartment,assistred living, condo, trailer, etc.)? Apartment      Is it one or more stories? 1 Story      How many  persons live in your home ? 2 Occasionally 3       Do you have any pets in your home ?(please list) No      Highest Level of education completed: GED- Nursing Classes       Current or past profession:  CNA      Do you exercise?  We Try                            Type & how often  Stationary Bike- [place]      ADVANCED DIRECTIVES (Please bring copies)      Do you have a living will? Yes      Do you have a DNR form?  No                     If not, do you want to discuss one?       Do you have signed POA?HPOA forms? No                If so, please bring to your appointment      FUNCTIONAL STATUS- To be completed by Spouse / child / Staff       Do you have difficulty bathing or dressing yourself ?  Yes- Shoulder      Do you have difficulty preparing food or eating ? Yes      Do you have difficulty managing your mediation ?  No But Care Taker Assist       Do you have difficulty managing your finances ?  No      Do you have difficulty affording your medication ?  No      Social Determinants of Radio broadcast assistant Strain: Not on file  Food Insecurity: Not on file  Transportation  Needs: Not on file  Physical Activity: Not on file  Stress: Not on file  Social Connections: Not on file  Intimate Partner Violence: Not on file   Family History  Problem Relation Age of Onset   Uterine cancer Mother    Hypertension Father    Kidney failure Brother    Cancer Son    Cancer Daughter    Colon cancer Neg Hx    Esophageal cancer Neg Hx    Liver disease Neg Hx    Inflammatory bowel disease Neg Hx    Pancreatic cancer Neg Hx    Rectal cancer Neg Hx    Stomach cancer Neg Hx    I have reviewed her medical, social, and family history in detail and updated the electronic medical record as necessary.    PHYSICAL EXAMINATION  BP 94/60   Pulse 86   Ht 4\' 11"  (1.499 m)   Wt 110 lb (49.9 kg)   BMI 22.22 kg/m  Wt Readings from Last 3 Encounters:  10/16/20 114 lb (51.7 kg)   10/15/20 110 lb (49.9 kg)  09/11/20 113 lb 6.4 oz (51.4 kg)  GEN: NAD, appears younger than stated age, doesn't appear chronically ill, accompanied by granddaughter PSYCH: Cooperative, without pressured speech EYE: Conjunctivae pink, sclerae anicteric ENT: MMM CV: Nontachycardic RESP: No audible wheezing GI: NABS, soft, NT/ND, without rebound MSK/EXT: Patient wearing [place] infused shoulder pad; no significant lower extremity edema SKIN: No jaundice NEURO:  Alert & Oriented x 3, no focal deficits   REVIEW OF DATA  I reviewed the following data at the time of this encounter:  GI Procedures and Studies  Previously reviewed  Laboratory Studies  Reviewed in epic  Imaging Studies  June 2022 CT abdomen with contrast IMPRESSION: 1. No acute abnormality of the abdomen. 2. Status post cholecystectomy with unchanged postoperative biliary ductal dilatation. 3. Coronary artery disease.  Aortic Atherosclerosis (ICD10-I70.0).   ASSESSMENT  Ms. Calvo is a 87 y.o. female with a pmh significant for hypertension, hypothyroidism, prior bowel obstruction (no resection many years ago), melanoma of the nose, status post cholecystectomy, status post hysterectomy, sleep apnea, GERD, gastritis, chronic constipation, biliary dilation, chronic dysphagia.  The patient is seen today for evaluation and management of:  1. Other dysphagia   2. Gastroesophageal reflux disease without esophagitis   3. Bloated abdomen   4. Constipation, unspecified constipation type    The patient is hemodynamically and clinically stable at this time.  She seems to have had some improvement in some of her dysphagia symptoms, she will dysphagia, while being on twice daily PPI once again.  Patient's daughter notes that her grandmother does not always chew appropriately and use proper maneuvers as previously evaluated by SLP.  They are interested in potentially relearning some of those maneuvers such that her grandmother/the  patient can have further improvement in swallowing.  They want to hold on endoscopic reevaluation and repeat dilation for now due to the risks of sedation but would be interested in this if things progress or worsen significantly.  She will continue twice daily PPI for now.  Not clear fiber supplementation has been helpful for her bowel habits but has discussed the role of adding MiraLAX regular basis to see if that may help with symptoms.  No plan for colonoscopic evaluation and cross-sectional imaging of the abdomen showed no abnormalities.  We will see the patient back in follow-up in the beginning of the year.  All patient questions were answered to the best of  my ability, and the patient agrees to the aforementioned plan of action with follow-up as indicated.   PLAN  Continue PPI twice daily SLP evaluation/referral If progressive dysphagia symptoms to solid food/pills occurs then we will consider a barium swallow with tablet and potential EGD with dilation and consideration of manometry if barium swallow is unremarkable Continue stool softeners twice daily Continue fiber supplementation Initiate MiraLAX once daily as needed to have at least 3 bowel movements per week   Orders Placed This Encounter  Procedures   Ambulatory referral to Speech Therapy     New Prescriptions   MUPIROCIN OINTMENT (BACTROBAN) 2 %    Apply 1 application topically 2 (two) times daily.   Modified Medications   No medications on file    Planned Follow Up: Return in about 4 months (around 02/14/2021).   Total Time in Face-to-Face and in Coordination of Care for patient including independent/personal interpretation/review of prior testing, medical history, examination, medication adjustment, communicating results with the patient directly, and documentation with the EHR is 25 minutes.   Justice Britain, MD [facility] Office # [phone]    Addendum after clinic  visit I reviewed imaging that had been performed over the course the last few years since I had seen her in clinic.  A right upper quadrant ultrasound had suggested a potential abnormality in the head of the pancreas versus just the CBD being evaluated.  I think it would be wise to consider a CT abdomen to further evaluate the biliary tree.  I would recommend a contrasted CT with IV contrast and oral contrast and this may need to be waited on until the contrast shortage improves but I will reach out to the patient's granddaughter and discussed this and potentially order a CT scan thereafter since this was not discussed in clinic at the time of her visit.  Justice Britain, MD [facility] Office # [phone]

## 2020-10-16 ENCOUNTER — Telehealth: Payer: Self-pay

## 2020-10-16 ENCOUNTER — Ambulatory Visit: Payer: Medicare HMO | Admitting: Podiatry

## 2020-10-16 ENCOUNTER — Ambulatory Visit (INDEPENDENT_AMBULATORY_CARE_PROVIDER_SITE_OTHER): Payer: Medicare HMO | Admitting: Adult Health

## 2020-10-16 ENCOUNTER — Encounter: Payer: Self-pay | Admitting: Adult Health

## 2020-10-16 VITALS — BP 100/70 | HR 78 | Temp 97.1°F | Resp 16 | Ht 59.0 in | Wt 114.0 lb

## 2020-10-16 DIAGNOSIS — S81802A Unspecified open wound, left lower leg, initial encounter: Secondary | ICD-10-CM

## 2020-10-16 NOTE — Progress Notes (Signed)
Location:  [facility]   Place of Service:   clinic    Allergies  Allergen Reactions   Brimonidine    Codeine Other (See Comments)   Levofloxacin Other (See Comments)   Lidocaine    Lipitor [Atorvastatin]    Meloxicam    Myrbetriq [name]]    Oxycodone    Pregabalin    Sulfa Antibiotics Other (See Comments)   Timolol Maleate    Zolpidem    Gabapentin Rash    Pt did not like the way it made her feel    Chief Complaint  Patient presents with   Acute Visit    Complains of left lower leg skin tear from fall.     HPI:  She had a fall yesterday got an abrasion on her left lower leg. She went to urgent care; where her leg was wrapped. She is here today for follow up. She has a long history of skin issues on her left lower leg. She has bruising on all extremities. She denies any fevers. She takes goody powders on a prn daily basis.   Past Medical History:  Diagnosis Date   Anxiety    Arthritis    Phreesia 03/05/2020   Bowel obstruction ([place]) 1950   Bursitis    Cancer of septum of nose (HCC)    melanoma   Constipation    At times   Depression    GERD (gastroesophageal reflux disease)    History of open leg wound    Hypertension    Hypothyroidism    Osteoporosis    Phreesia 03/05/2020   Swallowing difficulty    Thyroid activity decreased    Torn rotator cuff     Past Surgical History:  Procedure Laterality Date   ABDOMINAL HYSTERECTOMY N/A    Phreesia 38/10/1749   APPLICATION OF A-CELL OF EXTREMITY Left 10/17/2019   Procedure: APPLICATION OF INTEGRA BILAYER WOUND MATRIX OF EXTREMITY;  Surgeon: Wallace Going, DO;  Location: [facility];  Service: Plastics;  Laterality: Left;   BIOPSY  12/07/2017   Procedure: BIOPSY;  Surgeon: Rush Landmark Telford Nab., MD;  Location: [facility];  Service: Gastroenterology;;   CHOLECYSTECTOMY     ESOPHAGOGASTRODUODENOSCOPY (EGD) WITH PROPOFOL N/A 12/07/2017   Procedure:  ESOPHAGOGASTRODUODENOSCOPY (EGD) WITH PROPOFOL;  Surgeon: Irving Copas., MD;  Location: [facility];  Service: Gastroenterology;  Laterality: N/A;  May need Dilation   HERNIA REPAIR N/A    Phreesia 03/05/2020   hysterecrtomy     I & D EXTREMITY Left 10/17/2019   Procedure: IRRIGATION AND DEBRIDEMENT EXTREMITY;  Surgeon: Wallace Going, DO;  Location: [facility];  Service: Plastics;  Laterality: Left;  45 min   JOINT REPLACEMENT N/A    Phreesia 03/05/2020   left hip surgery     ROTATOR CUFF REPAIR Right    SMALL INTESTINE SURGERY      Social History   Socioeconomic History   Marital status: Widowed    Spouse name: Not on file   Number of children: 8   Years of education: Not on file   Highest education level: Not on file  Occupational History   Not on file  Tobacco Use   Smoking status: Never   Smokeless tobacco: Never  Vaping Use   Vaping Use: Never used  Substance and Sexual Activity   Alcohol use: No    Comment: Red wine , 1 glass, 1-2 times a week   Drug use: No   Sexual  activity: Not Currently  Other Topics Concern   Not on file  Social History Narrative   Diet:No       Do you drink/ eat things with caffeine? Rarely Kayla Lloyd      Marital status: Widowed                              What year were you married ? 1944      Do you live in a house, apartment,assistred living, condo, trailer, etc.)? Apartment      Is it one or more stories? 1 Story      How many persons live in your home ? 2 Occasionally 3       Do you have any pets in your home ?(please list) No      Highest Level of education completed: GED- Nursing Classes       Current or past profession:  CNA      Do you exercise?  We Try                            Type & how often  Stationary Bike- [place]      ADVANCED DIRECTIVES (Please bring copies)      Do you have a living will? Yes      Do you have a DNR form?  No                     If not, do you want to discuss  one?       Do you have signed POA?HPOA forms? No                If so, please bring to your appointment      FUNCTIONAL STATUS- To be completed by Spouse / child / Staff       Do you have difficulty bathing or dressing yourself ?  Yes- Shoulder      Do you have difficulty preparing food or eating ? Yes      Do you have difficulty managing your mediation ?  No But Care Taker Assist       Do you have difficulty managing your finances ?  No      Do you have difficulty affording your medication ?  No      Social Determinants of Radio broadcast assistant Strain: Not on file  Food Insecurity: Not on file  Transportation Needs: Not on file  Physical Activity: Not on file  Stress: Not on file  Social Connections: Not on file  Intimate Partner Violence: Not on file   Family History  Problem Relation Age of Onset   Uterine cancer Mother    Hypertension Father    Kidney failure Brother    Cancer Son    Cancer Daughter    Colon cancer Neg Hx    Esophageal cancer Neg Hx    Liver disease Neg Hx    Inflammatory bowel disease Neg Hx    Pancreatic cancer Neg Hx    Rectal cancer Neg Hx    Stomach cancer Neg Hx       VITAL SIGNS BP 100/70   Pulse 78   Temp (!) 97.1 F (36.2 C)   Resp 16   Ht 4\' 11"  (1.499 m)   Wt 114 lb (51.7 kg)   SpO2 96%   BMI 23.03 kg/m  Outpatient Encounter Medications as of 10/16/2020  Medication Sig   acetaminophen (TYLENOL) 325 MG tablet Take 325 mg by mouth as needed.   acetaminophen (TYLENOL) 500 MG tablet Take 500 mg by mouth as needed.   ALPRAZolam (XANAX) 0.25 MG tablet Take 1 tablet (0.25 mg total) by mouth 3 (three) times daily.   castor oil liquid Take 15 mLs by mouth as needed.   cetirizine (ZYRTEC) 10 MG tablet TAKE 1 TABLET BY MOUTH EVERY DAY   Cyanocobalamin 1000 MCG/ML LIQD Take 1 tablet by mouth daily in the afternoon.   docusate sodium (COLACE) 100 MG capsule Take 100 mg by mouth as needed.   DULoxetine (CYMBALTA) 60 MG capsule  Take 60 mg by mouth 2 (two) times daily.   Estrogens, Conjugated (PREMARIN VA) Place 1 application vaginally daily.   ferrous sulfate 325 (65 FE) MG EC tablet Take 325 mg by mouth. Every other week   Ginkgo Biloba 120 MG CAPS Take 120 mg by mouth 2 (two) times a week.   Lactobacillus (PROBIOTIC ACIDOPHILUS PO) Take 1 capsule by mouth daily.   levothyroxine (SYNTHROID) 75 MCG tablet Take 1 tablet (75 mcg total) by mouth daily.   loperamide (IMODIUM) 2 MG capsule Take by mouth as needed for diarrhea or loose stools.   losartan (COZAAR) 100 MG tablet Take 1 tablet (100 mg total) by mouth daily.   Magnesium Gluconate 27.5 MG TABS Take 500 mg by mouth daily in the afternoon.   Menthol, Topical Analgesic, 154 MG PADS Place 1 each onto the skin daily as needed (for hip pain.).   mupirocin ointment (BACTROBAN) 2 % Apply 1 application topically 2 (two) times daily.   NON FORMULARY MV-MIN-Folic Acid-Lutein 403-474 mcg Chew take by mouth   NON FORMULARY Ubidecarenone-Omega 3-vit E 25-150-200   Nutritional Supplements (FEEDING SUPPLEMENT, PEDIASURE 1.5,) LIQD liquid Take 237 mLs by mouth 2 (two) times daily between meals.   pantoprazole (PROTONIX) 40 MG tablet Take 1 tablet (40 mg total) by mouth 2 (two) times daily.   PROLIA 60 MG/ML SOSY injection Take 60 mg by mouth every 6 (six) months.   Pumpkin Seed-Soy Germ (AZO BLADDER CONTROL/GO-LESS PO) Take 1 tablet by mouth daily as needed (for bladder pain.).   traMADol (ULTRAM) 50 MG tablet TAKE 1 TABLET BY MOUTH 3 TIMES DAILY as needed for pain   trolamine salicylate (ASPERCREME) 10 % cream Apply 1 application topically as needed for muscle pain.   [DISCONTINUED] DULoxetine (CYMBALTA) 60 MG capsule Take 1 capsule (60 mg total) by mouth daily. (Patient taking differently: Take 60 mg by mouth 2 (two) times daily.)   No facility-administered encounter medications on file as of 10/16/2020.     SIGNIFICANT DIAGNOSTIC EXAMS   Review of Systems   Constitutional:  Negative for malaise/fatigue.  Respiratory:  Negative for cough and shortness of breath.   Cardiovascular:  Negative for chest pain, palpitations and leg swelling.  Gastrointestinal:  Negative for abdominal pain, constipation and heartburn.  Musculoskeletal:  Negative for back pain, joint pain and myalgias.  Skin:        Has abrasion left lower leg Bruising present.   Neurological:  Negative for dizziness.  Psychiatric/Behavioral:  The patient is not nervous/anxious.    Physical Exam Constitutional:      General: She is not in acute distress.    Appearance: She is well-developed. She is not diaphoretic.     Comments: thin  Neck:     Thyroid: No thyromegaly.  Cardiovascular:  Rate and Rhythm: Normal rate and regular rhythm.     Heart sounds: Normal heart sounds.     Comments: Pedal pulses slightly faint  Pulmonary:     Effort: Pulmonary effort is normal. No respiratory distress.     Breath sounds: Normal breath sounds.  Abdominal:     General: Bowel sounds are normal. There is no distension.     Palpations: Abdomen is soft.     Tenderness: There is no abdominal tenderness.  Musculoskeletal:        General: Normal range of motion.     Cervical back: Neck supple.     Right lower leg: No edema.     Left lower leg: No edema.  Lymphadenopathy:     Cervical: No cervical adenopathy.  Skin:    General: Skin is warm and dry.     Comments: Left lower leg with large skin present with bleeding present. The area was cleaned with saline with abt oint applied covered with telfa; gauze wrap and ace applied.   Neurological:     Mental Status: She is alert. Mental status is at baseline.  Psychiatric:        Mood and Affect: Mood normal.      ASSESSMENT/ PLAN:  TODAY  Left lower leg abrasion following fall: she and her caregiver have been instructed on wound care. Verbalized understanding. At this time there is no indication of infection present. Will hold off on  abt at this time. Caregiver is aware to contact through my chart for any changes in her wound.    Ok Edwards NP [facility]  Contact [phone] Monday through Friday 8am- 5pm  After hours call [phone]

## 2020-10-16 NOTE — Telephone Encounter (Signed)
Per [place], patient needs to be evaluated by the wound clinic. We are available if surgical intervention is needed.

## 2020-10-17 ENCOUNTER — Telehealth: Payer: Self-pay | Admitting: Surgical

## 2020-10-17 ENCOUNTER — Other Ambulatory Visit: Payer: Self-pay | Admitting: Orthopedic Surgery

## 2020-10-17 ENCOUNTER — Telehealth: Payer: Self-pay

## 2020-10-17 DIAGNOSIS — R140 Abdominal distension (gaseous): Secondary | ICD-10-CM | POA: Insufficient documentation

## 2020-10-17 DIAGNOSIS — S81802A Unspecified open wound, left lower leg, initial encounter: Secondary | ICD-10-CM

## 2020-10-17 NOTE — Telephone Encounter (Signed)
Opened in error

## 2020-10-17 NOTE — Telephone Encounter (Signed)
Pt was seen by Kayla Lloyd, [facility] yesterday, 9/22 and was adv of wound care instructions.

## 2020-10-17 NOTE — Telephone Encounter (Signed)
Treatment options discussed with daughter. Advised to cover wound with nonadherent bandage. If bleeding returns advised to go to ED. New referral made to plastics. Recommend appointment ASAP.

## 2020-10-17 NOTE — Telephone Encounter (Signed)
Patient fell 9/21 and has an injury that won't stop bleeding. Patient has uploaded pictures to [facility] and is inquiring about wound care and drainage. The wound is open and exposed with coagulated blood. Advised pt and family to go to the ED. Please call Blima Jaimes to advise [phone]. Thank you.

## 2020-10-18 NOTE — ED Provider Notes (Signed)
[facility]   [id number] 10/15/20 Arrival Time: 9509  ASSESSMENT & PLAN:  1. Leg laceration, left, initial encounter   2. Abrasion of right upper extremity, initial encounter    No suturing secondary to thin skin. Discussed. She and daughter are very familiar with wound care. Has been seen before in wound care clinic.  Meds ordered this encounter  Medications   mupirocin ointment (BACTROBAN) 2 %    Sig: Apply 1 application topically 2 (two) times daily.    Dispense:  22 g    Refill:  1      Discharge Instructions      I recommend getting in to see wound care as soon as possible.   Reviewed expectations re: course of current medical issues. Questions answered. Outlined signs and symptoms indicating need for more acute intervention. Patient verbalized understanding. After Visit Summary given.   SUBJECTIVE:  Kayla Lloyd is a 87 y.o. female who presents with a laceration/wound to LLE and RUE. Today. Hit leg against car door. Moderate bleeding; controlled. Able to bear wt with assistance. No extremity sensation changes or weakness. Also small abrasion to RUE.   Health Maintenance Due  Topic Date Due   COVID-19 Vaccine (2 - Janssen risk series) 06/05/2020   INFLUENZA VACCINE  08/25/2020    OBJECTIVE:  Vitals:   10/15/20 1654  BP: 118/71  Pulse: 83  Resp: 18  Temp: 98.5 F (36.9 C)  TempSrc: Oral  SpO2: 94%     General appearance: alert; no distress Skin: approx 2x4 cm open superficial wound on LLE; mild bleeding; surrounding bruising; RUE with small abrasion and no bleeding Ext: moves all extremities normally Psychological: alert and cooperative; normal mood and affect    Labs Reviewed - No data to display  No results found.  Allergies  Allergen Reactions   Brimonidine    Codeine Other (See Comments)   Levofloxacin Other (See Comments)   Lidocaine    Lipitor [Atorvastatin]    Meloxicam    Myrbetriq [name]]     Oxycodone    Pregabalin    Sulfa Antibiotics Other (See Comments)   Timolol Maleate    Zolpidem    Gabapentin Rash    Pt did not like the way it made her feel    Past Medical History:  Diagnosis Date   Anxiety    Arthritis    Phreesia 03/05/2020   Bowel obstruction (HCC) 1950   Bursitis    Cancer of septum of nose (HCC)    melanoma   Constipation    At times   Depression    GERD (gastroesophageal reflux disease)    History of open leg wound    Hypertension    Hypothyroidism    Osteoporosis    Phreesia 03/05/2020   Swallowing difficulty    Thyroid activity decreased    Torn rotator cuff    Social History   Socioeconomic History   Marital status: Widowed    Spouse name: Not on file   Number of children: 8   Years of education: Not on file   Highest education level: Not on file  Occupational History   Not on file  Tobacco Use   Smoking status: Never   Smokeless tobacco: Never  Vaping Use   Vaping Use: Never used  Substance and Sexual Activity   Alcohol use: No    Comment: Red wine , 1 glass, 1-2 times a week   Drug use: No   Sexual activity: Not  Currently  Other Topics Concern   Not on file  Social History Narrative   Diet:No       Do you drink/ eat things with caffeine? Rarely Green Tea      Marital status: Widowed                              What year were you married ? 1944      Do you live in a house, apartment,assistred living, condo, trailer, etc.)? Apartment      Is it one or more stories? 1 Story      How many persons live in your home ? 2 Occasionally 3       Do you have any pets in your home ?(please list) No      Highest Level of education completed: GED- Nursing Classes       Current or past profession:  CNA      Do you exercise?  We Try                            Type & how often  Stationary Bike- [place]      ADVANCED DIRECTIVES (Please bring copies)      Do you have a living will? Yes      Do you have a DNR form?  No                      If not, do you want to discuss one?       Do you have signed POA?HPOA forms? No                If so, please bring to your appointment      FUNCTIONAL STATUS- To be completed by Spouse / child / Staff       Do you have difficulty bathing or dressing yourself ?  Yes- Shoulder      Do you have difficulty preparing food or eating ? Yes      Do you have difficulty managing your mediation ?  No But Care Taker Assist       Do you have difficulty managing your finances ?  No      Do you have difficulty affording your medication ?  No      Social Determinants of Health   Financial Resource Strain: Not on file  Food Insecurity: Not on file  Transportation Needs: Not on file  Physical Activity: Not on file  Stress: Not on file  Social Connections: Not on file          Vanessa Kick, MD 10/18/20 1019

## 2020-10-20 ENCOUNTER — Encounter: Payer: Self-pay | Admitting: Orthopedic Surgery

## 2020-10-20 ENCOUNTER — Telehealth: Payer: Self-pay

## 2020-10-20 NOTE — Telephone Encounter (Signed)
Patient's granddaughter, Kayla Lloyd, called regarding patient.  I gave her the message from Roetta Sessions, PA-C, who recommended evaluation by wound care center and to visit her PCP if there was a delay in being seen at the wound care center.    Kayla Lloyd called again today to say that they sent pictures to her PCP and her PCP referred patient to our office.  Kayla Lloyd said that patient is on blood thinners and OTC aspirin products (Goody powder).  She states that she took patient to the Urgent Care last Wednesday (10/15/20) and they were unable to give her stitches so they wrapped her wound and sent her home.  Kayla Lloyd said that the bandage was soaked through by Wednesday night and it smelled bad.  She said that she gave the patient pumpkin seed extract to try to thicken her blood.  Kayla Lloyd wants the patient to be seen by [place], as the patient's PCP has referred her to [place].  I told Kayla Lloyd, per [place], RNFA, if the bleeding is controlled by the dressing, then leave it in place.  If it continues to bleed uncontrollably, go to the ED or Urgent Care.  I let Kayla Lloyd know that I will forward this information to our clinical provider for recommendation for further care.  Kayla Lloyd said that she would be willing for the patient to be seen via a MyChart visit.

## 2020-10-21 ENCOUNTER — Other Ambulatory Visit: Payer: Self-pay | Admitting: Orthopedic Surgery

## 2020-10-21 ENCOUNTER — Encounter: Payer: Self-pay | Admitting: Family

## 2020-10-21 ENCOUNTER — Ambulatory Visit (INDEPENDENT_AMBULATORY_CARE_PROVIDER_SITE_OTHER): Payer: Medicare HMO | Admitting: Family

## 2020-10-21 ENCOUNTER — Other Ambulatory Visit: Payer: Self-pay

## 2020-10-21 VITALS — BP 110/70 | HR 74 | Temp 97.3°F | Ht 59.0 in

## 2020-10-21 DIAGNOSIS — M79605 Pain in left leg: Secondary | ICD-10-CM

## 2020-10-21 DIAGNOSIS — S81819A Laceration without foreign body, unspecified lower leg, initial encounter: Secondary | ICD-10-CM

## 2020-10-21 DIAGNOSIS — S81802D Unspecified open wound, left lower leg, subsequent encounter: Secondary | ICD-10-CM

## 2020-10-21 NOTE — Patient Instructions (Addendum)
-   Take tylenol as needed for pain  - Monitor wound for bleeding.please go to ED if wound continues to bleed.

## 2020-10-21 NOTE — Progress Notes (Addendum)
Provider: Teyana Pierron FNP-C  [name], NP  Patient Care Team: [name], NP as PCP - General (Adult Health Nurse Practitioner)  Extended Emergency Contact Information Primary Emergency Contact: [facility] Phone: [phone] Work Phone: [phone] Mobile Phone: [phone] Relation: Granddaughter Secondary Emergency Contact: [name] Address: [address]          [place], [place] [address] [name] of [place] Mobile Phone: [phone] Relation: Granddaughter  Code Status:  Full Code  Goals of care: Advanced Directive information Advanced Directives 10/16/2020  Does Patient Have a Medical Advance Directive? No  Type of Advance Directive -  Does patient want to make changes to medical advance directive? -  Copy of [place] in Chart? -  Would patient like information on creating a medical advance directive? No - Patient declined     Chief Complaint  Patient presents with   Acute Visit    Patient having wound rechecked.granddaughter is not sure if wound is getting better because she has not looked at it since Friday. Very painful.    HPI:  Pt is a 87 y.o. female seen today for an acute visit for evaluation of left lower leg wound.she was seen here by Ulyses Jarred after she fell 10/15/2020 sustaining an abrasion to left leg.she was evaluated in urgent care. Care giver was instructed on wound care had no indication for antibiotics treatment during 10/16/2020 visit.Daughter states wound site was bleeding a lot so research ways to stop bleeding and gave patient vitamin K from pumpkin seed.Wound has large clot was afraid to remove incase it starts bleeding again.patient states wound site still painful.she denies any fever,chills Not on ASA or any anticoagulant. Daughter took pictures of wound and send via My chart which I reviewed with Dr.Miller.    Past Medical History:  Diagnosis Date   Anxiety    Arthritis    Phreesia  03/05/2020   Bowel obstruction ([facility]) 1950   Bursitis    Cancer of septum of nose (HCC)    melanoma   Constipation    At times   Depression    GERD (gastroesophageal reflux disease)    History of open leg wound    Hypertension    Hypothyroidism    Osteoporosis    Phreesia 03/05/2020   Swallowing difficulty    Thyroid activity decreased    Torn rotator cuff    Past Surgical History:  Procedure Laterality Date   ABDOMINAL HYSTERECTOMY N/A    Phreesia 17/51/0258   APPLICATION OF A-CELL OF EXTREMITY Left 10/17/2019   Procedure: APPLICATION OF INTEGRA BILAYER WOUND MATRIX OF EXTREMITY;  Surgeon: Wallace Going, DO;  Location: [facility];  Service: Plastics;  Laterality: Left;   BIOPSY  12/07/2017   Procedure: BIOPSY;  Surgeon: Rush Landmark Telford Nab., MD;  Location: [facility];  Service: Gastroenterology;;   CHOLECYSTECTOMY     ESOPHAGOGASTRODUODENOSCOPY (EGD) WITH PROPOFOL N/A 12/07/2017   Procedure: ESOPHAGOGASTRODUODENOSCOPY (EGD) WITH PROPOFOL;  Surgeon: Irving Copas., MD;  Location: [facility];  Service: Gastroenterology;  Laterality: N/A;  May need Dilation   HERNIA REPAIR N/A    Phreesia 03/05/2020   hysterecrtomy     I & D EXTREMITY Left 10/17/2019   Procedure: IRRIGATION AND DEBRIDEMENT EXTREMITY;  Surgeon: Wallace Going, DO;  Location: [facility];  Service: Plastics;  Laterality: Left;  45 min   JOINT REPLACEMENT N/A    Phreesia 03/05/2020   left hip surgery     ROTATOR  CUFF REPAIR Right    SMALL INTESTINE SURGERY      Allergies  Allergen Reactions   Brimonidine    Childrens Non-Aspirin [Acetaminophen]    Codeine Other (See Comments)   Levofloxacin Other (See Comments)   Lidocaine    Lipitor [Atorvastatin]    Meloxicam    Myrbetriq [name]]    Oxycodone    Pregabalin    Sulfa Antibiotics Other (See Comments)   Timolol Maleate    Zolpidem    Gabapentin Rash    Pt did not like the way it made her  feel    Outpatient Encounter Medications as of 10/21/2020  Medication Sig   acetaminophen (TYLENOL) 325 MG tablet Take 325 mg by mouth as needed.   acetaminophen (TYLENOL) 500 MG tablet Take 500 mg by mouth as needed.   ALPRAZolam (XANAX) 0.25 MG tablet Take 1 tablet (0.25 mg total) by mouth 3 (three) times daily.   castor oil liquid Take 15 mLs by mouth as needed.   cetirizine (ZYRTEC) 10 MG tablet TAKE 1 TABLET BY MOUTH EVERY DAY   Cyanocobalamin 1000 MCG/ML LIQD Take 1 tablet by mouth daily in the afternoon.   docusate sodium (COLACE) 100 MG capsule Take 100 mg by mouth as needed.   DULoxetine (CYMBALTA) 60 MG capsule Take 60 mg by mouth 2 (two) times daily.   Estrogens, Conjugated (PREMARIN VA) Place 1 application vaginally daily.   ferrous sulfate 325 (65 FE) MG EC tablet Take 325 mg by mouth. Every other week   Lactobacillus (PROBIOTIC ACIDOPHILUS PO) Take 1 capsule by mouth daily.   levothyroxine (SYNTHROID) 75 MCG tablet Take 1 tablet (75 mcg total) by mouth daily.   loperamide (IMODIUM) 2 MG capsule Take by mouth as needed for diarrhea or loose stools.   losartan (COZAAR) 100 MG tablet Take 1 tablet (100 mg total) by mouth daily.   Magnesium Gluconate 27.5 MG TABS Take 500 mg by mouth daily in the afternoon.   Menthol, Topical Analgesic, 154 MG PADS Place 1 each onto the skin daily as needed (for hip pain.).   mupirocin ointment (BACTROBAN) 2 % Apply 1 application topically 2 (two) times daily.   NON FORMULARY MV-MIN-Folic Acid-Lutein 675-916 mcg Chew take by mouth   NON FORMULARY Ubidecarenone-Omega 3-vit E 25-150-200   Nutritional Supplements (FEEDING SUPPLEMENT, PEDIASURE 1.5,) LIQD liquid Take 237 mLs by mouth 2 (two) times daily between meals.   pantoprazole (PROTONIX) 40 MG tablet Take 1 tablet (40 mg total) by mouth 2 (two) times daily.   PROLIA 60 MG/ML SOSY injection Take 60 mg by mouth every 6 (six) months.   Pumpkin Seed-Soy Germ (AZO BLADDER CONTROL/GO-LESS PO) Take 1  tablet by mouth daily as needed (for bladder pain.).   traMADol (ULTRAM) 50 MG tablet TAKE 1 TABLET BY MOUTH 3 TIMES DAILY as needed for pain   trolamine salicylate (ASPERCREME) 10 % cream Apply 1 application topically as needed for muscle pain.   [DISCONTINUED] Ginkgo Biloba 120 MG CAPS Take 120 mg by mouth 2 (two) times a week.   No facility-administered encounter medications on file as of 10/21/2020.    Review of Systems  Constitutional:  Negative for appetite change, chills, fatigue, fever and unexpected weight change.  HENT:  Negative for congestion, ear discharge, ear pain, facial swelling, hearing loss, nosebleeds, postnasal drip, rhinorrhea, sinus pressure, sinus pain, sneezing and sore throat.   Eyes:  Negative for pain, discharge, redness, itching and visual disturbance.  Respiratory:  Negative for cough,  chest tightness, shortness of breath and wheezing.   Cardiovascular:  Negative for chest pain, palpitations and leg swelling.  Gastrointestinal:  Negative for abdominal distention, abdominal pain, blood in stool, constipation, diarrhea, nausea and vomiting.  Genitourinary:  Negative for difficulty urinating, dysuria, flank pain, frequency and urgency.  Musculoskeletal:  Positive for gait problem. Negative for arthralgias, back pain, joint swelling and myalgias.  Skin:  Positive for wound. Negative for color change, pallor and rash.       Left leg wound per HPI   Neurological:  Negative for dizziness, syncope, speech difficulty, weakness, light-headedness, numbness and headaches.  Hematological:  Does not bruise/bleed easily.  Psychiatric/Behavioral:  Negative for agitation, behavioral problems, confusion, hallucinations and sleep disturbance. The patient is not nervous/anxious.    Immunization History  Administered Date(s) Administered   Fluad Quad(high Dose 65+) 12/22/2018   IPV 04/29/2020   Influenza-Unspecified 11/06/2015   Janssen (J&J) SARS-COV-2 Vaccination 05/08/2020    Pneumococcal Conjugate-13 11/19/2014   Pertinent  Health Maintenance Due  Topic Date Due   INFLUENZA VACCINE  08/25/2020   DEXA SCAN  Completed   Fall Risk  10/16/2020 09/11/2020 05/01/2020 03/06/2020 02/15/2020  Falls in the past year? 1 0 0 0 0  Number falls in past yr: 0 0 0 0 0  Injury with Fall? 1 0 0 0 0  Risk for fall due to : History of fall(s) History of fall(s) - No Fall Risks -  Follow up Falls evaluation completed Falls evaluation completed;Falls prevention discussed;Education provided - Falls evaluation completed Falls evaluation completed   Functional Status Survey:    Vitals:   10/21/20 1313  BP: 110/70  Pulse: 74  Temp: (!) 97.3 F (36.3 C)  SpO2: 96%  Height: 4\' 11"  (1.499 m)   Body mass index is 23.03 kg/m. Physical Exam Vitals reviewed.  Constitutional:      General: She is not in acute distress.    Appearance: Normal appearance. She is normal weight. She is not ill-appearing or diaphoretic.  HENT:     Head: Normocephalic.     Mouth/Throat:     Mouth: Mucous membranes are moist.     Pharynx: Oropharynx is clear. No posterior oropharyngeal erythema.  Eyes:     General: No scleral icterus.       Right eye: No discharge.        Left eye: No discharge.     Conjunctiva/sclera: Conjunctivae normal.     Pupils: Pupils are equal, round, and reactive to light.  Cardiovascular:     Rate and Rhythm: Normal rate and regular rhythm.     Pulses: Normal pulses.     Heart sounds: Normal heart sounds. No murmur heard.   No friction rub. No gallop.  Pulmonary:     Effort: Pulmonary effort is normal. No respiratory distress.     Breath sounds: Normal breath sounds. No wheezing, rhonchi or rales.  Chest:     Chest wall: No tenderness.  Abdominal:     General: Bowel sounds are normal. There is no distension.     Palpations: Abdomen is soft. There is no mass.     Tenderness: There is no abdominal tenderness. There is no right CVA tenderness, left CVA tenderness or  guarding.  Musculoskeletal:        General: No swelling or tenderness. Normal range of motion.     Right lower leg: No edema.     Left lower leg: No edema.  Skin:    General: Skin is  warm and dry.     Coloration: Skin is not pale.     Findings: No bruising, erythema, lesion or rash.     Comments: Left upper leg wound with large dark blood clot surrounding skin without any erythema.blood clot gently removed by Dr.Miller wound cleansed with saline,pat dry and covered mepilex gauze,4 x 4 gauze and secured with kerlix and ACE wrap.tolerated procedure well.  Neurological:     Mental Status: She is alert. Mental status is at baseline.     Motor: No weakness.     Gait: Gait abnormal.  Psychiatric:        Mood and Affect: Mood normal.        Speech: Speech normal.        Behavior: Behavior normal.        Thought Content: Thought content normal.        Judgment: Judgment normal.    Labs reviewed: Recent Labs    01/10/20 1617 04/29/20 0000 07/23/20 1245 09/11/20 1349  NA 135 130*  --  129*  K 4.9 4.6  --  4.3  CL 96 91*  --  95*  CO2 28 24*  --  26  GLUCOSE 78  --   --  101  BUN 19 21  --  17  CREATININE 0.68 0.6 0.60 0.58*  CALCIUM 9.4 9.4  --  9.3   Recent Labs    01/10/20 1617 04/29/20 0000 09/11/20 1349  AST 16 20 16   ALT 9 8 8   ALKPHOS 79 81  --   BILITOT 0.4  --  0.5  PROT 6.5  --  6.1  ALBUMIN 4.0 4.1  --    Recent Labs    04/29/20 0000 09/11/20 1349  WBC 5.2 5.1  NEUTROABS  --  4,289  HGB 12.1 12.0  HCT 37 35.2  MCV  --  101.1*  PLT 159 181   Lab Results  Component Value Date   TSH 1.100 01/10/2020   No results found for: HGBA1C Lab Results  Component Value Date   CHOL 252 (H) 06/26/2019   HDL 94 06/26/2019   LDLCALC 144 (H) 06/26/2019   TRIG 84 06/26/2019   CHOLHDL 2.7 06/26/2019    Significant Diagnostic Results in last 30 days:  No results found.  Assessment/Plan   1. Wound of left leg, subsequent encounter Afebrile. Left upper leg  wound with large dark blood clot surrounding skin without any erythema.blood clot gently removed by Dr.Miller wound cleansed with saline,pat dry and covered mepilex gauze,4 x 4 gauze and secured with kerlix and ACE wrap.tolerated procedure well. - recommended Home health Nurse for wound care but daughter declined states can change wound dressing then notify provider for any signs of infection.  - Take tylenol as needed for pain  - Monitor wound for bleeding.please go to ED if wound continues to bleed.  - advised to follow up here tomorrow for evaluation then daughter may change dressing as above.   2. Left leg pain Due to wound.pain has improved  Continue on tylenol.    Family/ staff Communication: Reviewed plan of care with patient and daughter verbalized understanding   Labs/tests ordered: None   Next Appointment: one day for left leg wound evaluation.   Sandrea Hughs, NP

## 2020-10-21 NOTE — Telephone Encounter (Signed)
Called daughter and scheduled an appointment with Dinah tomorrow 10/12/2020 at 53.

## 2020-10-22 ENCOUNTER — Encounter: Payer: Self-pay | Admitting: Family

## 2020-10-22 ENCOUNTER — Ambulatory Visit (INDEPENDENT_AMBULATORY_CARE_PROVIDER_SITE_OTHER): Payer: Medicare HMO | Admitting: Family

## 2020-10-22 ENCOUNTER — Ambulatory Visit: Payer: Medicare HMO | Admitting: Family

## 2020-10-22 VITALS — BP 120/64 | HR 87 | Temp 97.1°F | Ht 59.0 in | Wt 114.0 lb

## 2020-10-22 DIAGNOSIS — S81802D Unspecified open wound, left lower leg, subsequent encounter: Secondary | ICD-10-CM | POA: Diagnosis not present

## 2020-10-22 MED ORDER — XEROFORM PETROLAT PATCH 4"X4" EX PADS
1.0000 | MEDICATED_PAD | Freq: Every day | CUTANEOUS | 0 refills | Status: DC
Start: 2020-10-22 — End: 2022-07-01

## 2020-10-22 NOTE — Patient Instructions (Signed)
Cleanse left leg wound with saline,pat dry,cover wound bed with Xeroform gauze,and 4 X 4 gauze and secure with Kerlix and ACE wrap. Change dressing daily until healed. - Notify provider for any signs of infection.

## 2020-10-22 NOTE — Progress Notes (Signed)
Provider: Kareem Aul FNP-C  [name], NP  Patient Care Team: [name], NP as PCP - General (Adult Health Nurse Practitioner)  Extended Emergency Contact Information Primary Emergency Contact: [facility] Phone: [phone] Work Phone: [phone] Mobile Phone: [phone] Relation: Granddaughter Secondary Emergency Contact: [name] Address: [address]          [name],  [address] [name] of [place] Mobile Phone: [phone] Relation: Granddaughter  Code Status:  Full Code Goals of care: Advanced Directive information Advanced Directives 10/16/2020  Does Patient Have a Medical Advance Directive? No  Type of Advance Directive -  Does patient want to make changes to medical advance directive? -  Copy of [place] in Chart? -  Would patient like information on creating a medical advance directive? No - Patient declined     Chief Complaint  Patient presents with   Acute Visit    24 hour wound check. Here with granddaughter [name].     HPI:  Pt is a 87 y.o. female seen today for an acute visit for follow up left leg wound.she is here with her daughter who is her primary care giver.she was here yesterday after granddaughter noted wound was bleeding gave some vitamin from pumpkin seeds that helped to stop bleeding but formed a large blood clot on the wound.she was afraid to change it for fear of bleeding underneath the clot.Dr.Miller was consulted yesterday and was able to remove large blood clot from the wound.and pressure dressing was applied.States no more bleeding noted.Pain has improved too.  Past Medical History:  Diagnosis Date   Anxiety    Arthritis    Phreesia 03/05/2020   Bowel obstruction ([place]) 1950   Bursitis    Cancer of septum of nose (HCC)    melanoma   Constipation    At times   Depression    GERD (gastroesophageal reflux disease)    History of open leg wound    Hypertension     Hypothyroidism    Osteoporosis    Phreesia 03/05/2020   Swallowing difficulty    Thyroid activity decreased    Torn rotator cuff    Past Surgical History:  Procedure Laterality Date   ABDOMINAL HYSTERECTOMY N/A    Phreesia 94/[id number]/6759   APPLICATION OF A-CELL OF EXTREMITY Left 10/17/2019   Procedure: APPLICATION OF INTEGRA BILAYER WOUND MATRIX OF EXTREMITY;  Surgeon: Wallace Going, DO;  Location: [facility];  Service: Plastics;  Laterality: Left;   BIOPSY  12/07/2017   Procedure: BIOPSY;  Surgeon: Rush Landmark Telford Nab., MD;  Location: [facility];  Service: Gastroenterology;;   CHOLECYSTECTOMY     ESOPHAGOGASTRODUODENOSCOPY (EGD) WITH PROPOFOL N/A 12/07/2017   Procedure: ESOPHAGOGASTRODUODENOSCOPY (EGD) WITH PROPOFOL;  Surgeon: Irving Copas., MD;  Location: [facility];  Service: Gastroenterology;  Laterality: N/A;  May need Dilation   HERNIA REPAIR N/A    Phreesia 03/05/2020   hysterecrtomy     I & D EXTREMITY Left 10/17/2019   Procedure: IRRIGATION AND DEBRIDEMENT EXTREMITY;  Surgeon: Wallace Going, DO;  Location: [facility];  Service: Plastics;  Laterality: Left;  45 min   JOINT REPLACEMENT N/A    Phreesia 03/05/2020   left hip surgery     ROTATOR CUFF REPAIR Right    SMALL INTESTINE SURGERY      Allergies  Allergen Reactions   Brimonidine    Childrens Non-Aspirin [Acetaminophen]    Codeine Other (See Comments)   Levofloxacin Other (See Comments)  Lidocaine    Lipitor [Atorvastatin]    Meloxicam    Myrbetriq [name]]    Oxycodone    Pregabalin    Sulfa Antibiotics Other (See Comments)   Timolol Maleate    Zolpidem    Gabapentin Rash    Pt did not like the way it made her feel    Outpatient Encounter Medications as of 10/22/2020  Medication Sig   acetaminophen (TYLENOL) 325 MG tablet Take 325 mg by mouth as needed.   acetaminophen (TYLENOL) 500 MG tablet Take 500 mg by mouth as needed.   ALPRAZolam  (XANAX) 0.25 MG tablet Take 1 tablet (0.25 mg total) by mouth 3 (three) times daily.   Bismuth Tribromoph-Petrolatum (XEROFORM PETROLAT PATCH 4"X4") PADS Apply 1 application topically daily.   castor oil liquid Take 15 mLs by mouth as needed.   cetirizine (ZYRTEC) 10 MG tablet TAKE 1 TABLET BY MOUTH EVERY DAY   Cyanocobalamin 1000 MCG/ML LIQD Take 1 tablet by mouth daily in the afternoon.   docusate sodium (COLACE) 100 MG capsule Take 100 mg by mouth as needed.   DULoxetine (CYMBALTA) 60 MG capsule Take 60 mg by mouth 2 (two) times daily.   Estrogens, Conjugated (PREMARIN VA) Place 1 application vaginally daily.   ferrous sulfate 325 (65 FE) MG EC tablet Take 325 mg by mouth. Every other week   Lactobacillus (PROBIOTIC ACIDOPHILUS PO) Take 1 capsule by mouth daily.   levothyroxine (SYNTHROID) 75 MCG tablet Take 1 tablet (75 mcg total) by mouth daily.   loperamide (IMODIUM) 2 MG capsule Take by mouth as needed for diarrhea or loose stools.   losartan (COZAAR) 100 MG tablet Take 1 tablet (100 mg total) by mouth daily.   Magnesium Gluconate 27.5 MG TABS Take 500 mg by mouth daily in the afternoon.   Menthol, Topical Analgesic, 154 MG PADS Place 1 each onto the skin daily as needed (for hip pain.).   mupirocin ointment (BACTROBAN) 2 % Apply 1 application topically 2 (two) times daily.   NON FORMULARY MV-MIN-Folic Acid-Lutein 382-505 mcg Chew take by mouth   NON FORMULARY Ubidecarenone-Omega 3-vit E 25-150-200   Nutritional Supplements (FEEDING SUPPLEMENT, PEDIASURE 1.5,) LIQD liquid Take 237 mLs by mouth 2 (two) times daily between meals.   pantoprazole (PROTONIX) 40 MG tablet Take 1 tablet (40 mg total) by mouth 2 (two) times daily.   PROLIA 60 MG/ML SOSY injection Take 60 mg by mouth every 6 (six) months.   Pumpkin Seed-Soy Germ (AZO BLADDER CONTROL/GO-LESS PO) Take 1 tablet by mouth daily as needed (for bladder pain.).   traMADol (ULTRAM) 50 MG tablet TAKE 1 TABLET BY MOUTH 3 TIMES DAILY as  needed for pain   trolamine salicylate (ASPERCREME) 10 % cream Apply 1 application topically as needed for muscle pain.   [DISCONTINUED] Ginkgo Biloba 120 MG CAPS Take 120 mg by mouth 2 (two) times a week.   No facility-administered encounter medications on file as of 10/22/2020.    Review of Systems  Constitutional:  Negative for chills, fatigue and fever.  Respiratory:  Negative for cough, chest tightness, shortness of breath and wheezing.   Cardiovascular:  Negative for chest pain, palpitations and leg swelling.  Musculoskeletal:  Positive for gait problem.  Skin:  Positive for wound. Negative for color change, pallor and rash.       Left leg wound   Neurological:  Negative for dizziness, weakness, light-headedness, numbness and headaches.   Immunization History  Administered Date(s) Administered   Fluad Quad(high  Dose 65+) 12/22/2018   IPV 04/29/2020   Influenza-Unspecified 11/06/2015   Janssen (J&J) SARS-COV-2 Vaccination 05/08/2020   Pneumococcal Conjugate-13 11/19/2014   Pertinent  Health Maintenance Due  Topic Date Due   INFLUENZA VACCINE  08/25/2020   DEXA SCAN  Completed   Fall Risk  10/16/2020 09/11/2020 05/01/2020 03/06/2020 02/15/2020  Falls in the past year? 1 0 0 0 0  Number falls in past yr: 0 0 0 0 0  Injury with Fall? 1 0 0 0 0  Risk for fall due to : History of fall(s) History of fall(s) - No Fall Risks -  Follow up Falls evaluation completed Falls evaluation completed;Falls prevention discussed;Education provided - Falls evaluation completed Falls evaluation completed   Functional Status Survey:    Vitals:   10/22/20 1136  BP: 120/64  Pulse: 87  Temp: (!) 97.1 F (36.2 C)  TempSrc: Temporal  SpO2: 98%  Weight: 114 lb (51.7 kg)  Height: 4\' 11"  (1.499 m)   Body mass index is 23.03 kg/m. Physical Exam Vitals reviewed.  Constitutional:      General: She is not in acute distress.    Appearance: Normal appearance. She is normal weight. She is not  ill-appearing or diaphoretic.  Eyes:     General: No scleral icterus.       Right eye: No discharge.        Left eye: No discharge.     Conjunctiva/sclera: Conjunctivae normal.     Pupils: Pupils are equal, round, and reactive to light.  Cardiovascular:     Rate and Rhythm: Normal rate and regular rhythm.     Pulses: Normal pulses.     Heart sounds: Normal heart sounds. No murmur heard.   No friction rub. No gallop.  Pulmonary:     Effort: Pulmonary effort is normal. No respiratory distress.     Breath sounds: Normal breath sounds. No wheezing, rhonchi or rales.  Chest:     Chest wall: No tenderness.  Musculoskeletal:        General: No swelling or tenderness. Normal range of motion.     Right lower leg: No edema.     Left lower leg: No edema.     Comments: On wheelchair during the visit but walks short distance with her cane   Skin:    General: Skin is warm and dry.     Coloration: Skin is not pale.     Findings: No bruising, erythema, lesion or rash.       Neurological:     Mental Status: She is alert and oriented to person, place, and time.     Motor: No weakness.     Gait: Gait normal.  Psychiatric:        Speech: Speech normal.    Labs reviewed: Recent Labs    01/10/20 1617 04/29/20 0000 07/23/20 1245 09/11/20 1349  NA 135 130*  --  129*  K 4.9 4.6  --  4.3  CL 96 91*  --  95*  CO2 28 24*  --  26  GLUCOSE 78  --   --  101  BUN 19 21  --  17  CREATININE 0.68 0.6 0.60 0.58*  CALCIUM 9.4 9.4  --  9.3   Recent Labs    01/10/20 1617 04/29/20 0000 09/11/20 1349  AST 16 20 16   ALT 9 8 8   ALKPHOS 79 81  --   BILITOT 0.4  --  0.5  PROT 6.5  --  6.1  ALBUMIN 4.0 4.1  --    Recent Labs    04/29/20 0000 09/11/20 1349  WBC 5.2 5.1  NEUTROABS  --  4,289  HGB 12.1 12.0  HCT 37 35.2  MCV  --  101.1*  PLT 159 181   Lab Results  Component Value Date   TSH 1.100 01/10/2020   No results found for: HGBA1C Lab Results  Component Value Date   CHOL 252  (H) 06/26/2019   HDL 94 06/26/2019   LDLCALC 144 (H) 06/26/2019   TRIG 84 06/26/2019   CHOLHDL 2.7 06/26/2019    Significant Diagnostic Results in last 30 days:  No results found.  Assessment/Plan   Wound of left leg, subsequent encounter Afebrile  Large skin tear without a flap 15% wound bed red but the rest of the wound has dark colored remains of blood clot unable to remove surrounding skin tissues without any erythema.No drainage,swelling or odor noted.wound cleansed to wound cleanser,pat dry,Xeroform applied and covered with several 4  x 4 gauze secured with Kerlix and ACE wrap.tolerated procedure well. Recommended home health Nurse but daughter decline states will change dressing by herself.Has changed her other wound dressing in the past.though states gauze material was provided by a company she could not recall the name ? Unclear if it was Home health or not. She will call provider when she finds the number.  - Bismuth Tribromoph-Petrolatum (XEROFORM PETROLAT PATCH 4"X4") PADS; Apply 1 application topically daily.  Dispense: 30 each; Refill: 0  Family/ staff Communication: Reviewed plan of care with patient verbalized understanding.   Labs/tests ordered: None   Next Appointment: 10/26/2020 for wound check   Sandrea Hughs, NP

## 2020-10-27 ENCOUNTER — Encounter: Payer: Self-pay | Admitting: Orthopedic Surgery

## 2020-10-28 ENCOUNTER — Ambulatory Visit: Payer: Medicare HMO | Admitting: Family

## 2020-11-06 ENCOUNTER — Encounter: Payer: Self-pay | Admitting: Orthopedic Surgery

## 2020-11-06 ENCOUNTER — Ambulatory Visit: Payer: Medicare HMO | Admitting: Podiatry

## 2020-11-06 DIAGNOSIS — K219 Gastro-esophageal reflux disease without esophagitis: Secondary | ICD-10-CM

## 2020-11-06 DIAGNOSIS — F419 Anxiety disorder, unspecified: Secondary | ICD-10-CM

## 2020-11-06 MED ORDER — ALPRAZOLAM 0.25 MG PO TABS: 0.2500 mg | ORAL_TABLET | Freq: Three times a day (TID) | ORAL | 5 refills | Status: DC

## 2020-11-06 MED ORDER — DULOXETINE HCL 60 MG PO CPEP: 60.0000 mg | ORAL_CAPSULE | Freq: Two times a day (BID) | ORAL | 1 refills | Status: DC

## 2020-11-06 MED ORDER — PANTOPRAZOLE SODIUM 40 MG PO TBEC: 40.0000 mg | DELAYED_RELEASE_TABLET | Freq: Two times a day (BID) | ORAL | 5 refills | Status: DC

## 2020-11-06 NOTE — Telephone Encounter (Signed)
Patient requested refill.  Epic LR: 09/22/2020 No Contract, Note added to upcoming appointment.  Pended Rxs and sent to Amy for approval.

## 2020-11-13 ENCOUNTER — Encounter: Payer: Self-pay | Admitting: Orthopedic Surgery

## 2020-11-13 DIAGNOSIS — M25551 Pain in right hip: Secondary | ICD-10-CM | POA: Diagnosis not present

## 2020-11-13 DIAGNOSIS — Z23 Encounter for immunization: Secondary | ICD-10-CM | POA: Diagnosis not present

## 2020-11-13 DIAGNOSIS — M1990 Unspecified osteoarthritis, unspecified site: Secondary | ICD-10-CM | POA: Diagnosis not present

## 2020-11-13 DIAGNOSIS — M810 Age-related osteoporosis without current pathological fracture: Secondary | ICD-10-CM | POA: Diagnosis not present

## 2020-11-13 DIAGNOSIS — Z79899 Other long term (current) drug therapy: Secondary | ICD-10-CM | POA: Diagnosis not present

## 2020-11-13 DIAGNOSIS — M7061 Trochanteric bursitis, right hip: Secondary | ICD-10-CM | POA: Diagnosis not present

## 2020-11-13 DIAGNOSIS — M2550 Pain in unspecified joint: Secondary | ICD-10-CM | POA: Diagnosis not present

## 2020-11-14 MED ORDER — DULOXETINE HCL 60 MG PO CPEP: 60.0000 mg | ORAL_CAPSULE | Freq: Two times a day (BID) | ORAL | 1 refills | Status: DC

## 2020-11-18 DIAGNOSIS — H353114 Nonexudative age-related macular degeneration, right eye, advanced atrophic with subfoveal involvement: Secondary | ICD-10-CM | POA: Diagnosis not present

## 2020-11-18 DIAGNOSIS — H353123 Nonexudative age-related macular degeneration, left eye, advanced atrophic without subfoveal involvement: Secondary | ICD-10-CM | POA: Diagnosis not present

## 2020-11-18 DIAGNOSIS — H35373 Puckering of macula, bilateral: Secondary | ICD-10-CM | POA: Diagnosis not present

## 2020-11-18 DIAGNOSIS — H04123 Dry eye syndrome of bilateral lacrimal glands: Secondary | ICD-10-CM | POA: Diagnosis not present

## 2020-11-19 DIAGNOSIS — M19011 Primary osteoarthritis, right shoulder: Secondary | ICD-10-CM | POA: Diagnosis not present

## 2020-11-26 ENCOUNTER — Other Ambulatory Visit: Payer: Self-pay

## 2020-11-26 ENCOUNTER — Telehealth: Payer: Self-pay | Admitting: Gastroenterology

## 2020-11-26 DIAGNOSIS — R1319 Other dysphagia: Secondary | ICD-10-CM

## 2020-11-26 DIAGNOSIS — K219 Gastro-esophageal reflux disease without esophagitis: Secondary | ICD-10-CM

## 2020-11-26 DIAGNOSIS — R1313 Dysphagia, pharyngeal phase: Secondary | ICD-10-CM

## 2020-11-26 NOTE — Telephone Encounter (Signed)
Kayla Lloyd is returning a call asked that you call her back.

## 2020-11-26 NOTE — Progress Notes (Signed)
Order for MBS has been placed in Epic. I have called left message to get this scheduled.

## 2020-11-26 NOTE — Telephone Encounter (Signed)
Return call to [place]. Informed her that patient will need to have MBS done again as the previous one was done in 01/2018. Order has been placed in epic. Tried scheduling with [facility] but had to leave message. Kayla Lloyd voiced understanding and will await call from [facility] to get MBS scheduled. I will also check to make sure this has been scheduled in the next few days.

## 2020-11-28 ENCOUNTER — Telehealth (HOSPITAL_COMMUNITY): Payer: Self-pay

## 2020-11-28 NOTE — Telephone Encounter (Signed)
Attempted to contact patient to schedule OP MBS - left voicemail. ?

## 2020-12-05 ENCOUNTER — Telehealth (HOSPITAL_COMMUNITY): Payer: Self-pay

## 2020-12-05 NOTE — Telephone Encounter (Signed)
2nd attempt to contact patient to schedule OP MBS - left voicemail. ?

## 2020-12-12 ENCOUNTER — Telehealth (HOSPITAL_COMMUNITY): Payer: Self-pay

## 2020-12-12 NOTE — Telephone Encounter (Signed)
3rd attempt to contact patient to schedule OP MBS - left voicemail. If no return call by 11.25, order will be cancelled.

## 2020-12-15 ENCOUNTER — Other Ambulatory Visit: Payer: Self-pay | Admitting: Family Medicine

## 2020-12-19 ENCOUNTER — Other Ambulatory Visit: Payer: Self-pay | Admitting: Orthopedic Surgery

## 2020-12-25 ENCOUNTER — Ambulatory Visit (INDEPENDENT_AMBULATORY_CARE_PROVIDER_SITE_OTHER): Payer: Medicare HMO | Admitting: Podiatry

## 2020-12-25 ENCOUNTER — Other Ambulatory Visit: Payer: Self-pay

## 2020-12-25 DIAGNOSIS — M79675 Pain in left toe(s): Secondary | ICD-10-CM | POA: Diagnosis not present

## 2020-12-25 DIAGNOSIS — M79674 Pain in right toe(s): Secondary | ICD-10-CM

## 2020-12-25 DIAGNOSIS — B351 Tinea unguium: Secondary | ICD-10-CM

## 2020-12-25 DIAGNOSIS — G629 Polyneuropathy, unspecified: Secondary | ICD-10-CM | POA: Diagnosis not present

## 2020-12-28 NOTE — Progress Notes (Signed)
Subjective: 87 y.o. returns the office today for painful, elongated, thickened toenails which she cannot trim herself. Denies any redness or drainage around the nails.  Gets some swelling to the ankles but no recent injuries.  She is previous had wounds on her legs that have healed.  Denies any systemic complaints such as fevers, chills, nausea, vomiting.   PCP: Wendie Agreste, MD   Objective: NAD DP/PT pulses palpable, CRT less than 3 seconds Venous insufficiency is noted with bruising present in the legs.  Mild edema to the ankles.  There is no pain on the calf.  The calves are supple. Nails hypertrophic, dystrophic, elongated, brittle, discolored 10. There is tenderness overlying the nails 1-5 bilaterally. There is no surrounding erythema or drainage along the nail sites. No pain with calf compression, swelling, warmth, erythema.  Assessment: Patient presents with symptomatic onychomycosis  Plan: -Treatment options including alternatives, risks, complications were discussed -Nails sharply debrided 10 without complication/bleeding. -Continue Cymbalta for neuropathy -Discussed daily foot inspection. If there are any changes, to call the office immediately.  -She has bruising to the leg easily.  Encourage compression, elevation.  Monitor any skin breakdown. -Follow-up in 3 months or sooner if any problems are to arise. In the meantime, encouraged to call the office with any questions, concerns, changes symptoms.  Celesta Gentile, DPM

## 2021-01-01 ENCOUNTER — Other Ambulatory Visit: Payer: Self-pay | Admitting: Family Medicine

## 2021-01-01 DIAGNOSIS — E039 Hypothyroidism, unspecified: Secondary | ICD-10-CM

## 2021-01-27 ENCOUNTER — Telehealth: Payer: Self-pay | Admitting: *Deleted

## 2021-01-27 NOTE — Telephone Encounter (Signed)
Liborio Nixon, Nurse Case Manager with [facility] called requesting a current medication list to be faxed to her on patient. Stated that she was patient's Case Manager.   Fax: [phone]

## 2021-03-09 ENCOUNTER — Telehealth (HOSPITAL_COMMUNITY): Payer: Self-pay

## 2021-03-09 NOTE — Telephone Encounter (Signed)
Returned grand daughter's call regarding scheduling OP MBS for patient - left voicemail stating we do not have an order for patient. Informed grand daughter to contact MD office to place swallow study order.

## 2021-03-11 DIAGNOSIS — M25511 Pain in right shoulder: Secondary | ICD-10-CM | POA: Diagnosis not present

## 2021-03-11 DIAGNOSIS — M19011 Primary osteoarthritis, right shoulder: Secondary | ICD-10-CM | POA: Diagnosis not present

## 2021-03-18 ENCOUNTER — Encounter: Payer: Self-pay | Admitting: Orthopedic Surgery

## 2021-03-19 ENCOUNTER — Ambulatory Visit (INDEPENDENT_AMBULATORY_CARE_PROVIDER_SITE_OTHER): Payer: Medicare HMO | Admitting: Orthopedic Surgery

## 2021-03-19 ENCOUNTER — Other Ambulatory Visit: Payer: Self-pay

## 2021-03-19 ENCOUNTER — Encounter: Payer: Self-pay | Admitting: Orthopedic Surgery

## 2021-03-19 VITALS — BP 100/60 | HR 90 | Temp 97.2°F | Ht 59.0 in | Wt 115.6 lb

## 2021-03-19 DIAGNOSIS — D692 Other nonthrombocytopenic purpura: Secondary | ICD-10-CM | POA: Diagnosis not present

## 2021-03-19 DIAGNOSIS — K219 Gastro-esophageal reflux disease without esophagitis: Secondary | ICD-10-CM

## 2021-03-19 DIAGNOSIS — F419 Anxiety disorder, unspecified: Secondary | ICD-10-CM | POA: Diagnosis not present

## 2021-03-19 DIAGNOSIS — S81802D Unspecified open wound, left lower leg, subsequent encounter: Secondary | ICD-10-CM

## 2021-03-19 DIAGNOSIS — R630 Anorexia: Secondary | ICD-10-CM | POA: Diagnosis not present

## 2021-03-19 DIAGNOSIS — K5901 Slow transit constipation: Secondary | ICD-10-CM

## 2021-03-19 DIAGNOSIS — H3530 Unspecified macular degeneration: Secondary | ICD-10-CM

## 2021-03-19 DIAGNOSIS — I10 Essential (primary) hypertension: Secondary | ICD-10-CM | POA: Diagnosis not present

## 2021-03-19 DIAGNOSIS — E039 Hypothyroidism, unspecified: Secondary | ICD-10-CM | POA: Diagnosis not present

## 2021-03-19 LAB — CBC WITH DIFFERENTIAL/PLATELET
Absolute Monocytes: 673 cells/uL (ref 200–950)
Basophils Absolute: 32 cells/uL (ref 0–200)
Basophils Relative: 0.6 %
Eosinophils Absolute: 69 cells/uL (ref 15–500)
Eosinophils Relative: 1.3 %
HCT: 36.1 % (ref 35.0–45.0)
Hemoglobin: 12.3 g/dL (ref 11.7–15.5)
Lymphs Abs: 991 cells/uL (ref 850–3900)
MCH: 33.6 pg — ABNORMAL HIGH (ref 27.0–33.0)
MCHC: 34.1 g/dL (ref 32.0–36.0)
MCV: 98.6 fL (ref 80.0–100.0)
MPV: 11.2 fL (ref 7.5–12.5)
Monocytes Relative: 12.7 %
Neutro Abs: 3535 cells/uL (ref 1500–7800)
Neutrophils Relative %: 66.7 %
Platelets: 164 10*3/uL (ref 140–400)
RBC: 3.66 10*6/uL — ABNORMAL LOW (ref 3.80–5.10)
RDW: 12.4 % (ref 11.0–15.0)
Total Lymphocyte: 18.7 %
WBC: 5.3 10*3/uL (ref 3.8–10.8)

## 2021-03-19 LAB — COMPREHENSIVE METABOLIC PANEL
AG Ratio: 2 (calc) (ref 1.0–2.5)
ALT: 8 U/L (ref 6–29)
AST: 15 U/L (ref 10–35)
Albumin: 3.6 g/dL (ref 3.6–5.1)
Alkaline phosphatase (APISO): 52 U/L (ref 37–153)
BUN: 17 mg/dL (ref 7–25)
CO2: 27 mmol/L (ref 20–32)
Calcium: 8.2 mg/dL — ABNORMAL LOW (ref 8.6–10.4)
Chloride: 98 mmol/L (ref 98–110)
Creat: 0.61 mg/dL (ref 0.60–0.95)
Globulin: 1.8 g/dL (calc) — ABNORMAL LOW (ref 1.9–3.7)
Glucose, Bld: 79 mg/dL (ref 65–139)
Potassium: 4.1 mmol/L (ref 3.5–5.3)
Sodium: 131 mmol/L — ABNORMAL LOW (ref 135–146)
Total Bilirubin: 0.5 mg/dL (ref 0.2–1.2)
Total Protein: 5.4 g/dL — ABNORMAL LOW (ref 6.1–8.1)

## 2021-03-19 LAB — TSH: TSH: 1.17 mIU/L (ref 0.40–4.50)

## 2021-03-19 MED ORDER — ALPRAZOLAM 0.25 MG PO TABS: 0.2500 mg | ORAL_TABLET | Freq: Two times a day (BID) | ORAL | 2 refills | Status: DC

## 2021-03-19 MED ORDER — LOSARTAN POTASSIUM 50 MG PO TABS: 50.0000 mg | ORAL_TABLET | Freq: Every day | ORAL | 2 refills | Status: DC

## 2021-03-19 NOTE — Progress Notes (Signed)
Careteam: Patient Care Team: Kayla Alanis, NP as PCP - General (Adult Health Nurse Practitioner)  Seen by: Windell Moulding, AGNP-C  PLACE OF SERVICE:  [place] Directive information Does Patient Have a Medical Advance Directive?: No, Would patient like information on creating a medical advance directive?: No - Patient declined  Allergies  Allergen Reactions   Brimonidine    Childrens Non-Aspirin [Acetaminophen]    Codeine Other (See Comments)   Levofloxacin Other (See Comments)   Lidocaine    Lipitor [Atorvastatin]    Meloxicam    Myrbetriq [name]]    Oxycodone    Pregabalin    Sulfa Antibiotics Other (See Comments)   Timolol Maleate    Zolpidem    Gabapentin Rash    Pt did not like the way it made her feel    Chief Complaint  Patient presents with   Medical Management of Chronic Issues    6 month follow up. Discuss the need for Shingrix and Pneumonia vaccine, and additional Janssen vaccine, or post pone if patient refuses. Patient is present with granddaughter.      HPI: Patient is a 88 y.o. female seen today for medical management for chronic conditions.   Granddaughter present for encounter.   No recent falls or injuries.   Eating well- 3 meals daily. Also using Ensure/Boost daily.   Wound to lower leg is healed. She is keeping her lower legs covered daily because her skin is so fragile.   Blood pressure has been lower lately. Also reports some dizziness. Asking to reduce losartan.    Anxiety has improved since increasing Cymbalta. She has only been taking 2 xanax pills daily instead of three due to drowsiness.   Continues to have trouble seeing. 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   Living will discussed- just needs to be notarized. Advised to give copy to [facility].   Education given on Shingrix and pneumonia vaccines. Does not want due to advanced age.     Review of Systems:  Review of Systems  Constitutional:  Negative  for chills, fever, malaise/fatigue and weight loss.  HENT:  Negative for congestion and sore throat.   Eyes:  Negative for blurred vision and double vision.  Respiratory:  Negative for cough, shortness of breath and wheezing.   Cardiovascular:  Negative for chest pain and leg swelling.  Gastrointestinal:  Positive for constipation. Negative for abdominal pain, diarrhea, heartburn, nausea and vomiting.  Genitourinary:  Negative for dysuria and frequency.  Musculoskeletal:  Positive for joint pain. Negative for falls.  Skin:  Negative for rash.  Neurological:  Positive for dizziness and weakness. Negative for headaches.  Psychiatric/Behavioral:  Negative for depression and memory loss. The patient is nervous/anxious. The patient does not have insomnia.    Past Medical History:  Diagnosis Date   Anxiety    Arthritis    Phreesia 03/05/2020   Bowel obstruction (HCC) 1950   Bursitis    Cancer of septum of nose (HCC)    melanoma   Constipation    At times   Depression    GERD (gastroesophageal reflux disease)    History of open leg wound    Hypertension    Hypothyroidism    Osteoporosis    Phreesia 03/05/2020   Swallowing difficulty    Thyroid activity decreased    Torn rotator cuff    Past Surgical History:  Procedure Laterality Date   ABDOMINAL HYSTERECTOMY N/A    Phreesia 03/05/2020  APPLICATION OF A-CELL OF EXTREMITY Left 10/17/2019   Procedure: APPLICATION OF INTEGRA BILAYER WOUND MATRIX OF EXTREMITY;  Surgeon: Wallace Going, DO;  Location: [facility];  Service: Plastics;  Laterality: Left;   BIOPSY  12/07/2017   Procedure: BIOPSY;  Surgeon: Rush Landmark Telford Nab., MD;  Location: [facility];  Service: Gastroenterology;;   CHOLECYSTECTOMY     ESOPHAGOGASTRODUODENOSCOPY (EGD) WITH PROPOFOL N/A 12/07/2017   Procedure: ESOPHAGOGASTRODUODENOSCOPY (EGD) WITH PROPOFOL;  Surgeon: Irving Copas., MD;  Location: [facility];  Service:  Gastroenterology;  Laterality: N/A;  May need Dilation   HERNIA REPAIR N/A    Phreesia 03/05/2020   hysterecrtomy     I & D EXTREMITY Left 10/17/2019   Procedure: IRRIGATION AND DEBRIDEMENT EXTREMITY;  Surgeon: Wallace Going, DO;  Location: [facility];  Service: Plastics;  Laterality: Left;  45 min   JOINT REPLACEMENT N/A    Phreesia 03/05/2020   left hip surgery     ROTATOR CUFF REPAIR Right    SMALL INTESTINE SURGERY     Social History:   reports that she has never smoked. She has never used smokeless tobacco. She reports that she does not drink alcohol and does not use drugs.  Family History  Problem Relation Age of Onset   Uterine cancer Mother    Hypertension Father    Kidney failure Brother    Cancer Son    Cancer Daughter    Colon cancer Neg Hx    Esophageal cancer Neg Hx    Liver disease Neg Hx    Inflammatory bowel disease Neg Hx    Pancreatic cancer Neg Hx    Rectal cancer Neg Hx    Stomach cancer Neg Hx     Medications: Patient's Medications  New Prescriptions   No medications on file  Previous Medications   ACETAMINOPHEN (TYLENOL) 325 MG TABLET    Take 325 mg by mouth as needed.   ACETAMINOPHEN (TYLENOL) 500 MG TABLET    Take 500 mg by mouth as needed.   ALPRAZOLAM (XANAX) 0.25 MG TABLET    Take 1 tablet (0.25 mg total) by mouth 3 (three) times daily.   BISMUTH TRIBROMOPH-PETROLATUM (XEROFORM PETROLAT PATCH 4"X4") PADS    Apply 1 application topically daily.   CASTOR OIL LIQUID    Take 15 mLs by mouth as needed.   CETIRIZINE (ZYRTEC) 10 MG TABLET    TAKE 1 TABLET BY MOUTH EVERY DAY   CYANOCOBALAMIN 1000 MCG/ML LIQD    Take 1 tablet by mouth daily in the afternoon.   DOCUSATE SODIUM (COLACE) 100 MG CAPSULE    Take 100 mg by mouth as needed.   DULOXETINE (CYMBALTA) 60 MG CAPSULE    Take 1 capsule (60 mg total) by mouth 2 (two) times daily.   ESTROGENS, CONJUGATED (PREMARIN VA)    Place 1 application vaginally daily.   FERROUS SULFATE 325  (65 FE) MG EC TABLET    Take 325 mg by mouth. Every other week   LACTOBACILLUS (PROBIOTIC ACIDOPHILUS PO)    Take 1 capsule by mouth daily.   LEVOTHYROXINE (SYNTHROID) 75 MCG TABLET    TAKE 1 TABLET BY MOUTH EVERY DAY   LOPERAMIDE (IMODIUM) 2 MG CAPSULE    Take by mouth as needed for diarrhea or loose stools.   LOSARTAN (COZAAR) 100 MG TABLET    TAKE 1 TABLET BY MOUTH EVERY DAY   MAGNESIUM GLUCONATE 27.5 MG TABS    Take 500 mg by mouth daily  in the afternoon.   MENTHOL, TOPICAL ANALGESIC, 154 MG PADS    Place 1 each onto the skin daily as needed (for hip pain.).   MUPIROCIN OINTMENT (BACTROBAN) 2 %    Apply 1 application topically 2 (two) times daily.   NON FORMULARY    MV-MIN-Folic Acid-Lutein 315-400 mcg Chew take by mouth   NON FORMULARY    Ubidecarenone-Omega 3-vit E 25-150-200   NUTRITIONAL SUPPLEMENTS (FEEDING SUPPLEMENT, PEDIASURE 1.5,) LIQD LIQUID    Take 237 mLs by mouth 2 (two) times daily between meals.   PANTOPRAZOLE (PROTONIX) 40 MG TABLET    Take 1 tablet (40 mg total) by mouth 2 (two) times daily.   PROLIA 60 MG/ML SOSY INJECTION    Take 60 mg by mouth every 6 (six) months.   PUMPKIN SEED-SOY GERM (AZO BLADDER CONTROL/GO-LESS PO)    Take 1 tablet by mouth daily as needed (for bladder pain.).   TRAMADOL (ULTRAM) 50 MG TABLET    TAKE 1 TABLET BY MOUTH 3 TIMES DAILY as needed for pain   TROLAMINE SALICYLATE (ASPERCREME) 10 % CREAM    Apply 1 application topically as needed for muscle pain.  Modified Medications   No medications on file  Discontinued Medications   No medications on file    Physical Exam:  Vitals:   03/19/21 1309  BP: 100/60  Pulse: 90  Temp: (!) 97.2 F (36.2 C)  SpO2: 97%  Weight: 115 lb 9.6 oz (52.4 kg)  Height: 4\' 11"  (1.499 m)   Body mass index is 23.35 kg/m. Wt Readings from Last 3 Encounters:  03/19/21 115 lb 9.6 oz (52.4 kg)  10/22/20 114 lb (51.7 kg)  10/16/20 114 lb (51.7 kg)    Physical Exam Vitals reviewed.  Constitutional:       General: She is not in acute distress. HENT:     Head: Normocephalic.  Eyes:     General:        Right eye: No discharge.        Left eye: No discharge.  Neck:     Vascular: No carotid bruit.  Cardiovascular:     Rate and Rhythm: Normal rate and regular rhythm.     Pulses: Normal pulses.     Heart sounds: Normal heart sounds. No murmur heard. Pulmonary:     Effort: Pulmonary effort is normal. No respiratory distress.     Breath sounds: Normal breath sounds. No wheezing.  Abdominal:     General: Bowel sounds are normal. There is no distension.     Palpations: Abdomen is soft.     Tenderness: There is no abdominal tenderness.  Musculoskeletal:     Cervical back: Neck supple.     Right lower leg: Edema present.     Left lower leg: No edema.     Comments: Non pitting  Lymphadenopathy:     Cervical: No cervical adenopathy.  Skin:    General: Skin is warm and dry.     Capillary Refill: Capillary refill takes less than 2 seconds.     Comments: Red/purple lesions to extremities, vary in size, non tender, no skin breakdown. Lesion to LLE healed.   Neurological:     General: No focal deficit present.     Mental Status: She is alert and oriented to person, place, and time.     Motor: No weakness.     Gait: Gait normal.  Psychiatric:        Mood and Affect: Mood normal.  Behavior: Behavior normal.    Labs reviewed: Basic Metabolic Panel: Recent Labs    04/29/20 0000 07/23/20 1245 09/11/20 1349  NA 130*  --  129*  K 4.6  --  4.3  CL 91*  --  95*  CO2 24*  --  26  GLUCOSE  --   --  101  BUN 21  --  17  CREATININE 0.6 0.60 0.58*  CALCIUM 9.4  --  9.3   Liver Function Tests: Recent Labs    04/29/20 0000 09/11/20 1349  AST 20 16  ALT 8 8  ALKPHOS 81  --   BILITOT  --  0.5  PROT  --  6.1  ALBUMIN 4.1  --    No results for input(s): LIPASE, AMYLASE in the last 8760 hours. No results for input(s): AMMONIA in the last 8760 hours. CBC: Recent Labs     04/29/20 0000 09/11/20 1349  WBC 5.2 5.1  NEUTROABS  --  4,289  HGB 12.1 12.0  HCT 37 35.2  MCV  --  101.1*  PLT 159 181   Lipid Panel: No results for input(s): CHOL, HDL, LDLCALC, TRIG, CHOLHDL, LDLDIRECT in the last 8760 hours. TSH: No results for input(s): TSH in the last 8760 hours. A1C: No results found for: HGBA1C   Assessment/Plan 1. Anxiety - improved since increasing Cymbalta - will reduce xanax to bid - ALPRAZolam (XANAX) 0.25 MG tablet; Take 1 tablet (0.25 mg total) by mouth 2 (two) times daily.  Dispense: 60 tablet; Refill: 2  2. Essential hypertension - lower pressures and some dizziness - 100/60 in office today - will reduce dose today - advised to take bp x 1 week and report readings - losartan (COZAAR) 50 MG tablet; Take 1 tablet (50 mg total) by mouth daily.  Dispense: 90 tablet; Refill: 2 - CBC with Differential/Platelet - CMP  3. Acquired hypothyroidism - tsh stable - cont levothyroxine - TSH  4. Senile purpura ([place]) - education given  5. Gastroesophageal reflux disease, unspecified whether esophagitis present - hgb stable  - cont Protonix  6. Wound of left leg, subsequent encounter - resolved  7. Slow transit constipation - stable, abdomen soft - encourage hydration with water  8. Poor appetite - no weight loss - cont Ensure/Boost shakes  11. Macular degeneration, age related - followed by Dr. Jule Ser - cont Preservision supplement  Total time: 37 minutes. Greater than 50% of total time spent doing patient education regarding health maintenance, low blood pressure, dizziness, anxiety, falls prevention, and appetite.   Next appt: Visit date not found  [facility], [facility] [phone]

## 2021-03-19 NOTE — Patient Instructions (Addendum)
Please think about getting Prevnar 20 vaccine at local pharmacy  Blood pressure medication reduced in half  Please bring living will paperwork next visit  Check blood pressure 2 hours after taking medication- goal < 150/90

## 2021-03-23 ENCOUNTER — Encounter: Payer: Self-pay | Admitting: Orthopedic Surgery

## 2021-03-23 ENCOUNTER — Other Ambulatory Visit: Payer: Self-pay | Admitting: Orthopedic Surgery

## 2021-03-23 DIAGNOSIS — I10 Essential (primary) hypertension: Secondary | ICD-10-CM

## 2021-03-23 MED ORDER — LOSARTAN POTASSIUM 50 MG PO TABS: 50.0000 mg | ORAL_TABLET | Freq: Every day | ORAL | 2 refills | Status: DC

## 2021-03-26 ENCOUNTER — Other Ambulatory Visit: Payer: Self-pay

## 2021-03-26 ENCOUNTER — Ambulatory Visit (INDEPENDENT_AMBULATORY_CARE_PROVIDER_SITE_OTHER): Payer: Medicare HMO | Admitting: Podiatry

## 2021-03-26 DIAGNOSIS — M79675 Pain in left toe(s): Secondary | ICD-10-CM | POA: Diagnosis not present

## 2021-03-26 DIAGNOSIS — M79674 Pain in right toe(s): Secondary | ICD-10-CM | POA: Diagnosis not present

## 2021-03-26 DIAGNOSIS — R399 Unspecified symptoms and signs involving the genitourinary system: Secondary | ICD-10-CM | POA: Diagnosis not present

## 2021-03-26 DIAGNOSIS — B351 Tinea unguium: Secondary | ICD-10-CM

## 2021-03-30 ENCOUNTER — Other Ambulatory Visit: Payer: Self-pay | Admitting: Orthopedic Surgery

## 2021-03-30 ENCOUNTER — Other Ambulatory Visit: Payer: Self-pay | Admitting: Family Medicine

## 2021-03-30 DIAGNOSIS — E039 Hypothyroidism, unspecified: Secondary | ICD-10-CM

## 2021-03-30 NOTE — Telephone Encounter (Signed)
High risk or very high risk warning populated when attempting to refill medication. RX request sent to PCP for review and approval if warranted.   

## 2021-03-30 NOTE — Progress Notes (Signed)
Subjective: ?88 y.o. returns the office today for painful, elongated, thickened toenails which she cannot trim herself. Denies any redness or drainage around the nails.  She is previous had wounds on her legs that have healed.  Chronic discoloration, venous that she is noted.  Denies any systemic complaints such as fevers, chills, nausea, vomiting.  ? ?PCP: Wendie Agreste, MD ? ? ?Objective: ?NAD ?DP/PT pulses palpable, CRT less than 3 seconds ?Venous insufficiency is noted with bruising present in the legs.  Mild edema to the ankles.  There is no pain on the calf.  The calves are supple. ?Nails hypertrophic, dystrophic, elongated, brittle, discolored ?10. There is tenderness overlying the nails 1-5 bilaterally. There is no surrounding erythema or drainage along the nail sites. ?No pain with calf compression, swelling, warmth, erythema. ? ?Assessment: ?Patient presents with symptomatic onychomycosis ? ?Plan: ?-Treatment options including alternatives, risks, complications were discussed ?-Nails sharply debrided ?10 without complication/bleeding. ?-Continue current medications for neuropathy. ?-Discussed daily foot inspection. If there are any changes, to call the office immediately.  ?-Follow-up in 3 months or sooner if any problems are to arise. In the meantime, encouraged to call the office with any questions, concerns, changes symptoms. ? ?Celesta Gentile, DPM ? ?

## 2021-03-31 ENCOUNTER — Telehealth: Payer: Self-pay | Admitting: *Deleted

## 2021-03-31 NOTE — Telephone Encounter (Signed)
Received fax from [facility], Liborio Nixon [phone] Fax: [phone] ? ?For Incontinence supplies through the [facility]. The fax is for Incontinence Liners 150/month x 11refills ?Dx:S81.80  GH:[id number] ? ?Placed fax in Amy's folder to review and sign.  ?

## 2021-04-02 DIAGNOSIS — N898 Other specified noninflammatory disorders of vagina: Secondary | ICD-10-CM | POA: Diagnosis not present

## 2021-04-02 DIAGNOSIS — R300 Dysuria: Secondary | ICD-10-CM | POA: Diagnosis not present

## 2021-04-08 DIAGNOSIS — N952 Postmenopausal atrophic vaginitis: Secondary | ICD-10-CM | POA: Diagnosis not present

## 2021-04-08 DIAGNOSIS — N3946 Mixed incontinence: Secondary | ICD-10-CM | POA: Diagnosis not present

## 2021-04-08 DIAGNOSIS — N390 Urinary tract infection, site not specified: Secondary | ICD-10-CM | POA: Diagnosis not present

## 2021-04-22 DIAGNOSIS — N390 Urinary tract infection, site not specified: Secondary | ICD-10-CM | POA: Diagnosis not present

## 2021-05-12 DIAGNOSIS — H353123 Nonexudative age-related macular degeneration, left eye, advanced atrophic without subfoveal involvement: Secondary | ICD-10-CM | POA: Diagnosis not present

## 2021-05-12 DIAGNOSIS — H04123 Dry eye syndrome of bilateral lacrimal glands: Secondary | ICD-10-CM | POA: Diagnosis not present

## 2021-05-12 DIAGNOSIS — H353114 Nonexudative age-related macular degeneration, right eye, advanced atrophic with subfoveal involvement: Secondary | ICD-10-CM | POA: Diagnosis not present

## 2021-05-12 DIAGNOSIS — N390 Urinary tract infection, site not specified: Secondary | ICD-10-CM | POA: Diagnosis not present

## 2021-05-12 DIAGNOSIS — H35373 Puckering of macula, bilateral: Secondary | ICD-10-CM | POA: Diagnosis not present

## 2021-05-20 DIAGNOSIS — M1990 Unspecified osteoarthritis, unspecified site: Secondary | ICD-10-CM | POA: Diagnosis not present

## 2021-05-20 DIAGNOSIS — M7062 Trochanteric bursitis, left hip: Secondary | ICD-10-CM | POA: Diagnosis not present

## 2021-05-20 DIAGNOSIS — M810 Age-related osteoporosis without current pathological fracture: Secondary | ICD-10-CM | POA: Diagnosis not present

## 2021-05-20 DIAGNOSIS — M2550 Pain in unspecified joint: Secondary | ICD-10-CM | POA: Diagnosis not present

## 2021-05-20 DIAGNOSIS — Z79899 Other long term (current) drug therapy: Secondary | ICD-10-CM | POA: Diagnosis not present

## 2021-05-20 DIAGNOSIS — M25552 Pain in left hip: Secondary | ICD-10-CM | POA: Diagnosis not present

## 2021-06-03 DIAGNOSIS — M19012 Primary osteoarthritis, left shoulder: Secondary | ICD-10-CM | POA: Diagnosis not present

## 2021-06-03 DIAGNOSIS — M19011 Primary osteoarthritis, right shoulder: Secondary | ICD-10-CM | POA: Diagnosis not present

## 2021-06-03 DIAGNOSIS — R399 Unspecified symptoms and signs involving the genitourinary system: Secondary | ICD-10-CM | POA: Diagnosis not present

## 2021-06-17 DIAGNOSIS — H02055 Trichiasis without entropian left lower eyelid: Secondary | ICD-10-CM | POA: Diagnosis not present

## 2021-06-17 DIAGNOSIS — H02052 Trichiasis without entropian right lower eyelid: Secondary | ICD-10-CM | POA: Diagnosis not present

## 2021-06-17 DIAGNOSIS — H3122 Choroidal dystrophy (central areolar) (generalized) (peripapillary): Secondary | ICD-10-CM | POA: Diagnosis not present

## 2021-06-17 DIAGNOSIS — Z961 Presence of intraocular lens: Secondary | ICD-10-CM | POA: Diagnosis not present

## 2021-06-30 ENCOUNTER — Ambulatory Visit (INDEPENDENT_AMBULATORY_CARE_PROVIDER_SITE_OTHER): Payer: Medicare HMO | Admitting: Podiatry

## 2021-06-30 DIAGNOSIS — M79675 Pain in left toe(s): Secondary | ICD-10-CM | POA: Diagnosis not present

## 2021-06-30 DIAGNOSIS — B351 Tinea unguium: Secondary | ICD-10-CM | POA: Diagnosis not present

## 2021-06-30 DIAGNOSIS — M79674 Pain in right toe(s): Secondary | ICD-10-CM | POA: Diagnosis not present

## 2021-06-30 DIAGNOSIS — G629 Polyneuropathy, unspecified: Secondary | ICD-10-CM

## 2021-07-05 NOTE — Progress Notes (Signed)
Subjective: 88 y.o. returns the office today for painful, elongated, thickened toenails which she cannot trim herself. Denies any redness or drainage around the nails.  Denies any ulcerations and she has no new concerns today.  PCP: Wendie Agreste, MD  Objective: NAD DP/PT pulses palpable, CRT less than 3 seconds Venous insufficiency is noted with bruising present in the legs.  Mild edema to the ankles.  There is no pain on the calf.  The calves are supple.  No open lesions. Nails hypertrophic, dystrophic, elongated, brittle, discolored 10. There is tenderness overlying the nails 1-5 bilaterally. There is no surrounding erythema or drainage along the nail sites. No pain with calf compression, swelling, warmth, erythema.  Assessment: Patient presents with symptomatic onychomycosis  Plan: -Treatment options including alternatives, risks, complications were discussed -Nails sharply debrided 10 without complication/bleeding. -Continue current medications for neuropathy. -Discussed daily foot inspection. If there are any changes, to call the office immediately.  -Follow-up in 3 months or sooner if any problems are to arise. In the meantime, encouraged to call the office with any questions, concerns, changes symptoms.  Celesta Gentile, DPM

## 2021-08-13 DIAGNOSIS — L820 Inflamed seborrheic keratosis: Secondary | ICD-10-CM | POA: Diagnosis not present

## 2021-08-13 DIAGNOSIS — L821 Other seborrheic keratosis: Secondary | ICD-10-CM | POA: Diagnosis not present

## 2021-08-17 DIAGNOSIS — R399 Unspecified symptoms and signs involving the genitourinary system: Secondary | ICD-10-CM | POA: Diagnosis not present

## 2021-09-16 ENCOUNTER — Telehealth: Payer: Self-pay

## 2021-09-16 NOTE — Telephone Encounter (Signed)
Since GYN placed her on different medication, I think she should contact GYN before changing/refilling.

## 2021-09-16 NOTE — Telephone Encounter (Signed)
Patient's granddaughter called stating that patient's GYN recently took her off of the premarin cream and put her on a medication that was a tablet to be inserted. He stated that patient doesn't like that one and was wanting to know if she could have the cream refilled. Told granddaughter that she may have to call GYN.  Message routed to Windell Moulding, NP

## 2021-09-17 ENCOUNTER — Ambulatory Visit: Payer: Medicare HMO | Admitting: Orthopedic Surgery

## 2021-09-17 NOTE — Telephone Encounter (Signed)
Called and left message to call office when available

## 2021-09-21 ENCOUNTER — Other Ambulatory Visit: Payer: Self-pay | Admitting: Orthopedic Surgery

## 2021-09-21 DIAGNOSIS — I10 Essential (primary) hypertension: Secondary | ICD-10-CM

## 2021-09-21 NOTE — Telephone Encounter (Signed)
Granddaughter called back and agreed.

## 2021-09-29 ENCOUNTER — Ambulatory Visit: Payer: Medicare HMO | Admitting: Podiatry

## 2021-09-29 ENCOUNTER — Ambulatory Visit (INDEPENDENT_AMBULATORY_CARE_PROVIDER_SITE_OTHER): Payer: Medicare HMO | Admitting: Obstetrics and Gynecology

## 2021-09-29 ENCOUNTER — Other Ambulatory Visit (HOSPITAL_COMMUNITY)
Admission: RE | Admit: 2021-09-29 | Discharge: 2021-09-29 | Disposition: A | Discharge status: Home / Self Care | Payer: Medicare HMO | Source: Ambulatory Visit | Attending: Obstetrics and Gynecology | Admitting: Obstetrics and Gynecology

## 2021-09-29 VITALS — BP 131/78 | HR 97 | Wt 115.0 lb

## 2021-09-29 DIAGNOSIS — N952 Postmenopausal atrophic vaginitis: Secondary | ICD-10-CM | POA: Diagnosis not present

## 2021-09-29 MED ORDER — PREMARIN 0.625 MG/GM VA CREA: TOPICAL_CREAM | VAGINAL | 2 refills | Status: DC

## 2021-09-29 NOTE — Progress Notes (Signed)
Pt states her vaginal area is "raw".  Needs refill on Premarin cream.

## 2021-09-29 NOTE — Progress Notes (Signed)
  CC: genital irritation Subjective:    Patient ID: Kayla Lloyd, female    DOB: Feb 11, 1923, 88 y.o.   MRN: [id number]  HPI 88 yo G8P8 seen for complaint of genital irritation.  [place] daughter present to assist in history.  Per Gdaughter, the patient notes almost constant irritation to perineum and genital area.  The patient will use multiple soaps, washes, reams and vaseline to the area without much relief.   Review of Systems     Objective:   Physical Exam Vitals:   09/29/21 1605  BP: 131/78  Pulse: 97   SSE: external genitalia moist with vulvo-vaginal atrophy noted.  No obvious lesions or excoriations noted.  No fissures noted. Hypopigmented tissue noted.  Vaginal swab obtained to rule out infectious etiology.      Assessment & Plan:   1. Vaginal atrophy Recommended cessation  of all other interventions except for water and/or mild soap. Will start premarin cream 0.5 mg daily for 2 weeks then  twice weekly. If any findings from swab, will treat. - conjugated estrogens (PREMARIN) vaginal cream; 0.5 mg vaginally daily x 2 weeks then twice weekly for maintenance  Dispense: 30 g; Refill: 2  2. Vaginitis, atrophic  - conjugated estrogens (PREMARIN) vaginal cream; 0.5 mg vaginally daily x 2 weeks then twice weekly for maintenance  Dispense: 30 g; Refill: 2  F/u in 5 weeks  Griffin Basil, MD Faculty Attending, [facility]

## 2021-09-30 ENCOUNTER — Other Ambulatory Visit: Payer: Self-pay | Admitting: Family Medicine

## 2021-09-30 ENCOUNTER — Other Ambulatory Visit: Payer: Self-pay | Admitting: Orthopedic Surgery

## 2021-09-30 DIAGNOSIS — E039 Hypothyroidism, unspecified: Secondary | ICD-10-CM

## 2021-10-01 LAB — CERVICOVAGINAL ANCILLARY ONLY
Bacterial Vaginitis (gardnerella): NEGATIVE
Candida Glabrata: POSITIVE — AB
Candida Vaginitis: NEGATIVE
Comment: NEGATIVE
Comment: NEGATIVE
Comment: NEGATIVE

## 2021-10-02 ENCOUNTER — Telehealth: Payer: Self-pay

## 2021-10-02 DIAGNOSIS — B379 Candidiasis, unspecified: Secondary | ICD-10-CM

## 2021-10-02 MED ORDER — FLUCONAZOLE 150 MG PO TABS: 150.0000 mg | ORAL_TABLET | Freq: Once | ORAL | 0 refills | Status: AC

## 2021-10-02 NOTE — Telephone Encounter (Signed)
Pt notified +yeast. Diflucan sent to the pharmacy. Insurance will not cover topical miconazole (Monistat). Patient agrees to get it over the counter.

## 2021-10-02 NOTE — Telephone Encounter (Signed)
-----   Message from Griffin Basil, MD sent at 10/02/2021  8:38 AM EDT ----- Yeast noted on swab, will offer diflucan  x 1 and topical miconazole for treatment

## 2021-10-05 ENCOUNTER — Encounter: Payer: Self-pay | Admitting: Adult Health

## 2021-10-05 ENCOUNTER — Ambulatory Visit (INDEPENDENT_AMBULATORY_CARE_PROVIDER_SITE_OTHER): Payer: Medicare HMO | Admitting: Adult Health

## 2021-10-05 VITALS — BP 110/60 | HR 87 | Temp 97.4°F | Ht 59.0 in | Wt 121.6 lb

## 2021-10-05 DIAGNOSIS — G629 Polyneuropathy, unspecified: Secondary | ICD-10-CM

## 2021-10-05 DIAGNOSIS — K5901 Slow transit constipation: Secondary | ICD-10-CM

## 2021-10-05 DIAGNOSIS — E039 Hypothyroidism, unspecified: Secondary | ICD-10-CM | POA: Diagnosis not present

## 2021-10-05 DIAGNOSIS — K219 Gastro-esophageal reflux disease without esophagitis: Secondary | ICD-10-CM

## 2021-10-05 DIAGNOSIS — F419 Anxiety disorder, unspecified: Secondary | ICD-10-CM | POA: Diagnosis not present

## 2021-10-05 DIAGNOSIS — I10 Essential (primary) hypertension: Secondary | ICD-10-CM

## 2021-10-05 DIAGNOSIS — M810 Age-related osteoporosis without current pathological fracture: Secondary | ICD-10-CM | POA: Diagnosis not present

## 2021-10-05 MED ORDER — DULOXETINE HCL 60 MG PO CPEP: 60.0000 mg | ORAL_CAPSULE | Freq: Every day | ORAL | 3 refills | Status: DC

## 2021-10-05 NOTE — Progress Notes (Unsigned)
[facility] clinic  Provider:   Code Status: *** Goals of Care:     03/18/2021    2:44 PM  Advanced Directives  Does Patient Have a Medical Advance Directive? No  Would patient like information on creating a medical advance directive? No - Patient declined     Chief Complaint  Patient presents with   Medical Management of Chronic Issues    Patient presents today for a 7 month follow-up   Quality Metric Gaps    Zoster, TDAP    HPI: Patient is a 88 y.o. female seen today for an acute visit for  Past Medical History:  Diagnosis Date   Anxiety    Arthritis    Phreesia 03/05/2020   Bowel obstruction ([place]) 1950   Bursitis    Cancer of septum of nose ([place])    melanoma   Constipation    At times   Depression    GERD (gastroesophageal reflux disease)    History of open leg wound    Hypertension    Hypothyroidism    Osteoporosis    Phreesia 03/05/2020   Swallowing difficulty    Thyroid activity decreased    Torn rotator cuff     Past Surgical History:  Procedure Laterality Date   ABDOMINAL HYSTERECTOMY N/A    Phreesia 01/27/7251   APPLICATION OF A-CELL OF EXTREMITY Left 10/17/2019   Procedure: APPLICATION OF INTEGRA BILAYER WOUND MATRIX OF EXTREMITY;  Surgeon: Wallace Going, DO;  Location: [facility];  Service: Plastics;  Laterality: Left;   BIOPSY  12/07/2017   Procedure: BIOPSY;  Surgeon: Rush Landmark Telford Nab., MD;  Location: [facility];  Service: Gastroenterology;;   CHOLECYSTECTOMY     ESOPHAGOGASTRODUODENOSCOPY (EGD) WITH PROPOFOL N/A 12/07/2017   Procedure: ESOPHAGOGASTRODUODENOSCOPY (EGD) WITH PROPOFOL;  Surgeon: Irving Copas., MD;  Location: [facility];  Service: Gastroenterology;  Laterality: N/A;  May need Dilation   HERNIA REPAIR N/A    Phreesia 03/05/2020   hysterecrtomy     I & D EXTREMITY Left 10/17/2019   Procedure: IRRIGATION AND DEBRIDEMENT EXTREMITY;  Surgeon: Wallace Going, DO;  Location: [facility];  Service: Plastics;  Laterality: Left;  45 min   JOINT REPLACEMENT N/A    Phreesia 03/05/2020   left hip surgery     ROTATOR CUFF REPAIR Right    SMALL INTESTINE SURGERY      Allergies  Allergen Reactions   Brimonidine    Childrens Non-Aspirin [Acetaminophen]    Codeine Other (See Comments)   Levofloxacin Other (See Comments)   Lidocaine    Lipitor [Atorvastatin]    Meloxicam    Myrbetriq [name]]    Oxycodone    Pregabalin    Sulfa Antibiotics Other (See Comments)   Timolol Maleate    Zolpidem    Gabapentin Rash    Pt did not like the way it made her feel    Outpatient Encounter Medications as of 10/05/2021  Medication Sig   acetaminophen (TYLENOL) 325 MG tablet Take 325 mg by mouth as needed.   acetaminophen (TYLENOL) 500 MG tablet Take 500 mg by mouth as needed.   ALPRAZolam (XANAX) 0.25 MG tablet Take 1 tablet (0.25 mg total) by mouth 2 (two) times daily.   b complex vitamins capsule Take 1 capsule by mouth daily.   Bismuth Tribromoph-Petrolatum (XEROFORM PETROLAT PATCH 4"X4") PADS Apply 1 application topically daily.   cetirizine (ZYRTEC) 10 MG tablet TAKE 1 TABLET BY MOUTH EVERY DAY   Cholecalciferol (  [employer] D3) 125 MCG (5000 UT) capsule Take 5,000 Units by mouth daily.   Coenzyme Q10 (COQ10) 100 MG CAPS Take by mouth.   conjugated estrogens (PREMARIN) vaginal cream 0.5 mg vaginally daily x 2 weeks then twice weekly for maintenance   Cyanocobalamin 1000 MCG/ML LIQD Take 1 tablet by mouth daily in the afternoon.   docusate sodium (COLACE) 100 MG capsule Take 100 mg by mouth as needed.   DULoxetine (CYMBALTA) 60 MG capsule TAKE 1 CAPSULE BY MOUTH 2 TIMES DAILY   hydrocortisone cream 0.5 % Apply 1 Application topically 2 (two) times daily.   Lactobacillus (PROBIOTIC ACIDOPHILUS PO) Take 1 capsule by mouth daily.   levothyroxine (SYNTHROID) 75 MCG tablet TAKE 1 TABLET BY MOUTH EVERY DAY   loperamide (IMODIUM) 2 MG capsule Take by mouth as needed for diarrhea  or loose stools.   losartan (COZAAR) 50 MG tablet TAKE 1 TABLET BY MOUTH EVERY DAY   Magnesium Gluconate 27.5 MG TABS Take 500 mg by mouth daily in the afternoon.   Menthol, Topical Analgesic, 154 MG PADS Place 1 each onto the skin daily as needed (for hip pain.).   Multiple Vitamins-Minerals (PRESERVISION AREDS 2 PO) Take by mouth.   NON FORMULARY MV-MIN-Folic Acid-Lutein 371-696 mcg Chew take by mouth   NON FORMULARY Ubidecarenone-Omega 3-vit E 25-150-200   Nutritional Supplements (FEEDING SUPPLEMENT, PEDIASURE 1.5,) LIQD liquid Take 237 mLs by mouth 2 (two) times daily between meals.   pantoprazole (PROTONIX) 40 MG tablet Take 1 tablet (40 mg total) by mouth 2 (two) times daily.   Probiotic Product (DAILY PROBIOTIC PO) Take by mouth.   PROLIA 60 MG/ML SOSY injection Take 60 mg by mouth every 6 (six) months.   Simethicone (GAS RELIEF PO) Take by mouth.   trolamine salicylate (ASPERCREME) 10 % cream Apply 1 application topically as needed for muscle pain.   white petrolatum (VASELINE) GEL Apply 1 Application topically as needed for lip care.   [DISCONTINUED] ferrous sulfate 325 (65 FE) MG EC tablet Take 325 mg by mouth. Every other week (Patient not taking: Reported on 09/29/2021)   [DISCONTINUED] mupirocin ointment (BACTROBAN) 2 % Apply 1 application topically 2 (two) times daily. (Patient not taking: Reported on 09/29/2021)   [DISCONTINUED] Pumpkin Seed-Soy Germ (AZO BLADDER CONTROL/GO-LESS PO) Take 1 tablet by mouth daily as needed (for bladder pain.). (Patient not taking: Reported on 09/29/2021)   [DISCONTINUED] traMADol (ULTRAM) 50 MG tablet TAKE 1 TABLET BY MOUTH 3 TIMES DAILY as needed for pain (Patient not taking: Reported on 09/29/2021)   No facility-administered encounter medications on file as of 10/05/2021.    Review of Systems:  Review of Systems  Constitutional:  Negative for appetite change, chills, fatigue and fever.  HENT:  Negative for congestion, hearing loss, rhinorrhea and sore  throat.   Eyes: Negative.   Respiratory:  Negative for cough, shortness of breath and wheezing.   Cardiovascular:  Positive for leg swelling. Negative for chest pain and palpitations.  Gastrointestinal:  Negative for abdominal pain, constipation, diarrhea, nausea and vomiting.  Genitourinary:  Negative for dysuria.  Musculoskeletal:  Negative for arthralgias, back pain and myalgias.  Skin:  Negative for color change, rash and wound.  Neurological:  Negative for dizziness, weakness and headaches.  Psychiatric/Behavioral:  Negative for behavioral problems. The patient is not nervous/anxious.     Health Maintenance  Topic Date Due   TETANUS/TDAP  Never done   Zoster Vaccines- Shingrix (1 of 2) Never done   INFLUENZA VACCINE  08/25/2021  Pneumonia Vaccine 14+ Years old (2 - PPSV23 or PCV20) 03/19/2022 (Originally 11/19/2015)   DEXA SCAN  Completed   HPV VACCINES  Aged Out   COVID-19 Vaccine  Discontinued    Physical Exam: Vitals:   10/05/21 1257  Weight: 121 lb 9.6 oz (55.2 kg)  Height: '4\' 11"'$  (1.499 m)   Body mass index is 24.56 kg/m. Physical Exam Constitutional:      Appearance: Normal appearance.  HENT:     Head: Normocephalic and atraumatic.     Nose: Nose normal.     Mouth/Throat:     Mouth: Mucous membranes are moist.  Eyes:     Conjunctiva/sclera: Conjunctivae normal.  Cardiovascular:     Rate and Rhythm: Normal rate and regular rhythm.  Pulmonary:     Effort: Pulmonary effort is normal.     Breath sounds: Normal breath sounds.  Abdominal:     General: Bowel sounds are normal.     Palpations: Abdomen is soft.  Musculoskeletal:        General: Swelling present. Normal range of motion.     Cervical back: Normal range of motion.     Right lower leg: Edema present.     Left lower leg: Edema present.     Comments: BLE edema 1+  Skin:    General: Skin is warm and dry.  Neurological:     General: No focal deficit present.     Mental Status: She is alert and  oriented to person, place, and time.  Psychiatric:        Mood and Affect: Mood normal.        Behavior: Behavior normal.        Thought Content: Thought content normal.        Judgment: Judgment normal.     Labs reviewed: Basic Metabolic Panel: Recent Labs    03/19/21 1353  NA 131*  K 4.1  CL 98  CO2 27  GLUCOSE 79  BUN 17  CREATININE 0.61  CALCIUM 8.2*  TSH 1.17   Liver Function Tests: Recent Labs    03/19/21 1353  AST 15  ALT 8  BILITOT 0.5  PROT 5.4*   No results for input(s): "LIPASE", "AMYLASE" in the last 8760 hours. No results for input(s): "AMMONIA" in the last 8760 hours. CBC: Recent Labs    03/19/21 1353  WBC 5.3  NEUTROABS 3,535  HGB 12.3  HCT 36.1  MCV 98.6  PLT 164   Lipid Panel: No results for input(s): "CHOL", "HDL", "LDLCALC", "TRIG", "CHOLHDL", "LDLDIRECT" in the last 8760 hours. No results found for: "HGBA1C"  Procedures since last visit: No results found.  Assessment/Plan   Labs/tests ordered:  * No order type specified * Next appt:  Visit date not found

## 2021-10-05 NOTE — Patient Instructions (Signed)
Peripheral Neuropathy Peripheral neuropathy is a type of nerve damage. It affects nerves that carry signals between the spinal cord and the arms, legs, and the rest of the body (peripheral nerves). It does not affect nerves in the spinal cord or brain. In peripheral neuropathy, one nerve or a group of nerves may be damaged. Peripheral neuropathy is a broad category that includes many specific nerve disorders, like diabetic neuropathy, hereditary neuropathy, and carpal tunnel syndrome. What are the causes? This condition may be caused by: Certain diseases, such as: Diabetes. This is the most common cause of peripheral neuropathy. Autoimmune diseases, such as rheumatoid arthritis and systemic lupus erythematosus. Nerve diseases that are passed from parent to child (inherited). Kidney disease. Thyroid disease. Other causes may include: Nerve injury. Pressure or stress on a nerve that lasts a long time. Lack (deficiency) of B vitamins. This can result from alcoholism, poor diet, or a restricted diet. Infections. Some medicines, such as cancer medicines (chemotherapy). Poisonous (toxic) substances, such as lead and mercury. Too little blood flowing to the legs. In some cases, the cause of this condition is not known. What are the signs or symptoms? Symptoms of this condition depend on which of your nerves is damaged. Symptoms in the legs, hands, and arms can include: Loss of feeling (numbness) in the feet, hands, or both. Tingling in the feet, hands, or both. Burning pain. Very sensitive skin. Weakness. Not being able to move a part of the body (paralysis). Clumsiness or poor coordination. Muscle twitching. Loss of balance. Symptoms in other parts of the body can include: Not being able to control your bladder. Feeling dizzy. Sexual problems. How is this diagnosed? Diagnosing and finding the cause of peripheral neuropathy can be difficult. Your health care provider will take your  medical history and do a physical exam. A neurological exam will also be done. This involves checking things that are affected by your brain, spinal cord, and nerves (nervous system). For example, your health care provider will check your reflexes, how you move, and what you can feel. You may have other tests, such as: Blood tests. Electromyogram (EMG) and nerve conduction tests. These tests check nerve function and how well the nerves are controlling the muscles. Imaging tests, such as a CT scan or MRI, to rule out other causes of your symptoms. Removing a small piece of nerve to be examined in a lab (nerve biopsy). Removing and examining a small amount of the fluid that surrounds the brain and spinal cord (lumbar puncture). How is this treated? Treatment for this condition may involve: Treating the underlying cause of the neuropathy, such as diabetes, kidney disease, or vitamin deficiencies. Stopping medicines that can cause neuropathy, such as chemotherapy. Medicine to help relieve pain. Medicines may include: Prescription or over-the-counter pain medicine. Anti-seizure medicine. Antidepressants. Pain-relieving patches that are applied to painful areas of skin. Surgery to relieve pressure on a nerve or to destroy a nerve that is causing pain. Physical therapy to help improve movement and balance. Devices to help you move around (assistive devices). Follow these instructions at home: Medicines Take over-the-counter and prescription medicines only as told by your health care provider. Do not take any other medicines without first asking your health care provider. Ask your health care provider if the medicine prescribed to you requires you to avoid driving or using machinery. Lifestyle  Do not use any products that contain nicotine or tobacco. These products include cigarettes, chewing tobacco, and vaping devices, such as e-cigarettes. Smoking keeps   blood from reaching damaged nerves. If you  need help quitting, ask your health care provider. Avoid or limit alcohol. Too much alcohol can cause a vitamin B deficiency, and vitamin B is needed for healthy nerves. Eat a healthy diet. This includes: Eating foods that are high in fiber, such as beans, whole grains, and fresh fruits and vegetables. Limiting foods that are high in fat and processed sugars, such as fried or sweet foods. General instructions  If you have diabetes, work closely with your health care provider to keep your blood sugar under control. If you have numbness in your feet: Check every day for signs of injury or infection. Watch for redness, warmth, and swelling. Wear padded socks and comfortable shoes. These help protect your feet. Develop a good support system. Living with peripheral neuropathy can be stressful. Consider talking with a mental health specialist or joining a support group. Use assistive devices and attend physical therapy as told by your health care provider. This may include using a walker or a cane. Keep all follow-up visits. This is important. Where to find more information National Institute of Neurological Disorders: www.ninds.nih.gov Contact a health care provider if: You have new signs or symptoms of peripheral neuropathy. You are struggling emotionally from dealing with peripheral neuropathy. Your pain is not well controlled. Get help right away if: You have an injury or infection that is not healing normally. You develop new weakness in an arm or leg. You have fallen or do so frequently. Summary Peripheral neuropathy is when the nerves in the arms or legs are damaged, resulting in numbness, weakness, or pain. There are many causes of peripheral neuropathy, including diabetes, pinched nerves, vitamin deficiencies, autoimmune disease, and hereditary conditions. Diagnosing and finding the cause of peripheral neuropathy can be difficult. Your health care provider will take your medical  history, do a physical exam, and do tests, including blood tests and nerve function tests. Treatment involves treating the underlying cause of the neuropathy and taking medicines to help control pain. Physical therapy and assistive devices may also help. This information is not intended to replace advice given to you by your health care provider. Make sure you discuss any questions you have with your health care provider. Document Revised: 09/16/2020 Document Reviewed: 09/16/2020 Elsevier Patient Education  2023 Elsevier Inc.  

## 2021-10-13 IMAGING — DX DG TIBIA/FIBULA 2V*L*
2 series · 2 of 2 positions shown · non-contrast
Comparison: 08/24/2019

CLINICAL DATA: Chronic wound. Left lower leg wound after trauma in
July 2019.

EXAM:
LEFT TIBIA AND FIBULA - 2 VIEW

[lower leg ap]
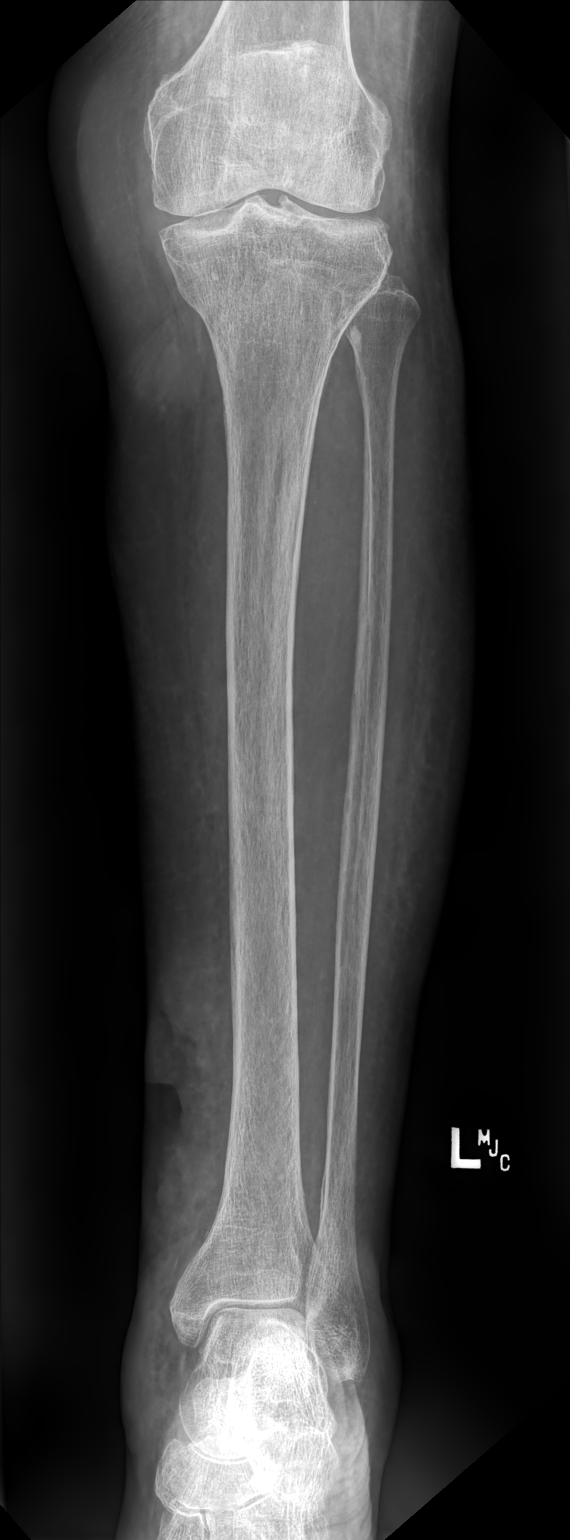

[lower leg lat]
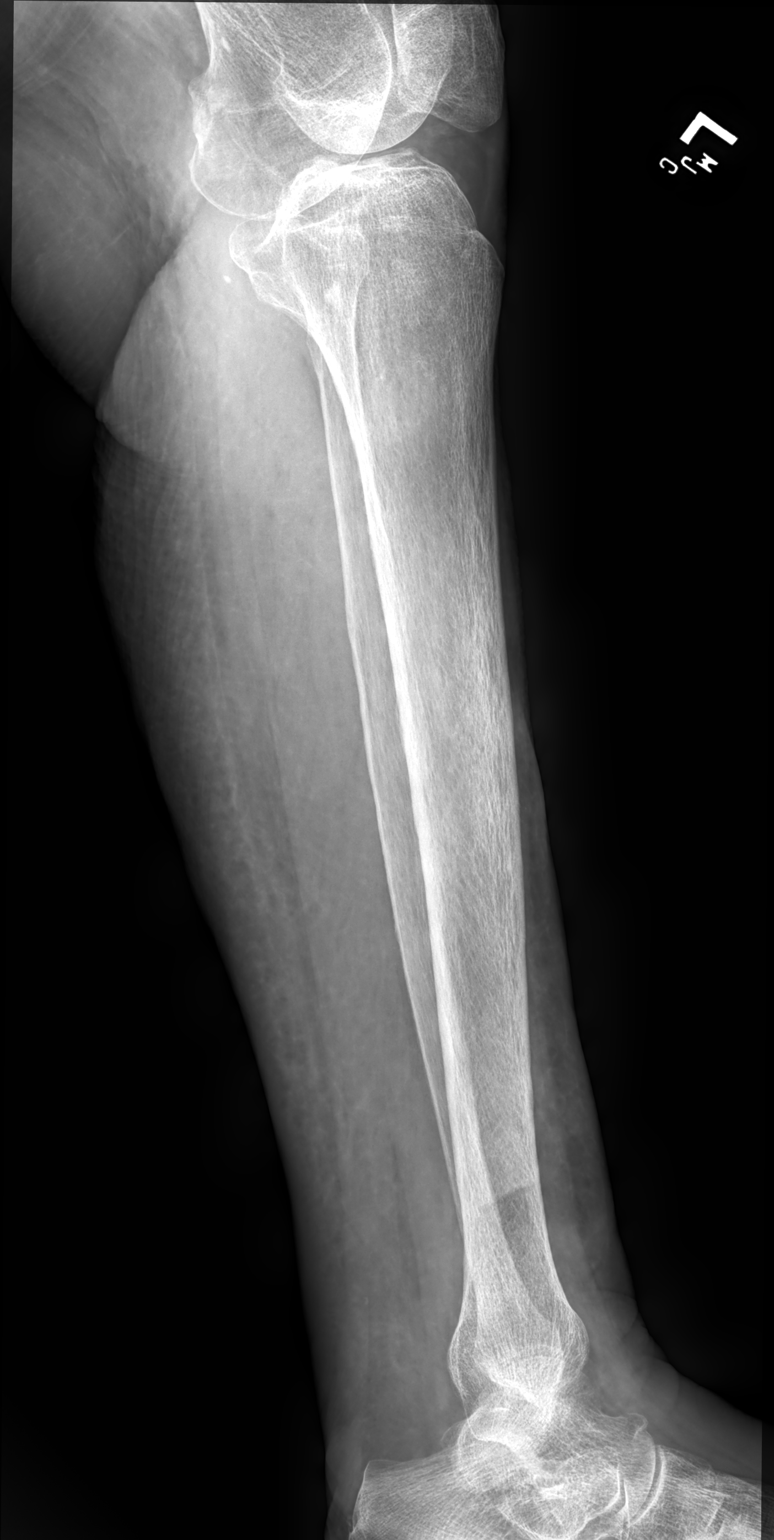

[2 of 2 positions shown; findings below may reference images not displayed]

FINDINGS: No fracture. No bone lesion. Skeletal structures are demineralized.

There is no bone resorption to suggest osteomyelitis.

Knee and ankle joints are normally aligned.

Soft tissue wound/defect is noted along the medial aspect of the
lower leg. No radiopaque foreign body. There is also diffuse
subcutaneous edema that extends from the mid leg through the ankle.
IMPRESSION: 1. Prominent soft tissue wound/defect along the medial aspect of the
lower leg with surrounding soft tissue edema/cellulitis.
2. No fracture or bone lesion.  No evidence of osteomyelitis.

## 2021-10-14 ENCOUNTER — Other Ambulatory Visit: Payer: Self-pay | Admitting: Orthopedic Surgery

## 2021-10-14 DIAGNOSIS — F419 Anxiety disorder, unspecified: Secondary | ICD-10-CM

## 2021-10-19 DIAGNOSIS — M19012 Primary osteoarthritis, left shoulder: Secondary | ICD-10-CM | POA: Diagnosis not present

## 2021-10-19 DIAGNOSIS — M19011 Primary osteoarthritis, right shoulder: Secondary | ICD-10-CM | POA: Diagnosis not present

## 2021-11-03 ENCOUNTER — Ambulatory Visit (INDEPENDENT_AMBULATORY_CARE_PROVIDER_SITE_OTHER): Payer: Medicare HMO | Admitting: Podiatry

## 2021-11-03 DIAGNOSIS — B351 Tinea unguium: Secondary | ICD-10-CM

## 2021-11-03 DIAGNOSIS — G629 Polyneuropathy, unspecified: Secondary | ICD-10-CM | POA: Diagnosis not present

## 2021-11-03 DIAGNOSIS — M79675 Pain in left toe(s): Secondary | ICD-10-CM

## 2021-11-03 DIAGNOSIS — M79674 Pain in right toe(s): Secondary | ICD-10-CM

## 2021-11-11 NOTE — Progress Notes (Signed)
Subjective: No chief complaint on file.   88 y.o. returns the office today for painful, elongated, thickened toenails which she cannot trim herself. Denies any redness or drainage around the nails.  States that her feet are doing much better.  Her daughter is with her today.  Denies any ulcerations and she has no new concerns today.  PCP: Wendie Agreste, MD  Objective: NAD DP/PT pulses palpable, CRT less than 3 seconds Venous insufficiency is noted with bruising present in the legs.  Mild edema to the ankles.  There is no pain on the calf.  The calves are supple.  No open lesions. Nails hypertrophic, dystrophic, elongated, brittle, discolored 10. There is tenderness overlying the nails 1-5 bilaterally. There is no surrounding erythema or drainage along the nail sites. No pain with calf compression, swelling, warmth, erythema.  Assessment: Patient presents with symptomatic onychomycosis  Plan: -Treatment options including alternatives, risks, complications were discussed -Nails sharply debrided 10 without complication/bleeding. -Continue current medications for neuropathy. -Elevation. -Discussed daily foot inspection. If there are any changes, to call the office immediately.  -Follow-up in 3 months or sooner if any problems are to arise. In the meantime, encouraged to call the office with any questions, concerns, changes symptoms.  Celesta Gentile, DPM

## 2021-11-16 ENCOUNTER — Other Ambulatory Visit: Payer: Self-pay | Admitting: Orthopedic Surgery

## 2021-11-16 DIAGNOSIS — K219 Gastro-esophageal reflux disease without esophagitis: Secondary | ICD-10-CM

## 2021-12-08 ENCOUNTER — Ambulatory Visit: Payer: Medicare HMO | Admitting: Obstetrics and Gynecology

## 2022-01-12 DIAGNOSIS — M1990 Unspecified osteoarthritis, unspecified site: Secondary | ICD-10-CM | POA: Diagnosis not present

## 2022-01-12 DIAGNOSIS — M25519 Pain in unspecified shoulder: Secondary | ICD-10-CM | POA: Diagnosis not present

## 2022-01-12 DIAGNOSIS — M810 Age-related osteoporosis without current pathological fracture: Secondary | ICD-10-CM | POA: Diagnosis not present

## 2022-01-12 DIAGNOSIS — M2550 Pain in unspecified joint: Secondary | ICD-10-CM | POA: Diagnosis not present

## 2022-01-12 DIAGNOSIS — Z79899 Other long term (current) drug therapy: Secondary | ICD-10-CM | POA: Diagnosis not present

## 2022-01-12 DIAGNOSIS — M25552 Pain in left hip: Secondary | ICD-10-CM | POA: Diagnosis not present

## 2022-01-12 DIAGNOSIS — M7062 Trochanteric bursitis, left hip: Secondary | ICD-10-CM | POA: Diagnosis not present

## 2022-01-23 IMAGING — DX DG ANKLE COMPLETE 3+V*R*
3 series · 3 of 3 positions shown · non-contrast
Comparison: Right lower extremity radiograph dated 08/24/2019.

CLINICAL DATA: [AGE] female with right ankle pain. No known
injury.

EXAM:
RIGHT ANKLE - COMPLETE 3+ VIEW

[ankle ap]
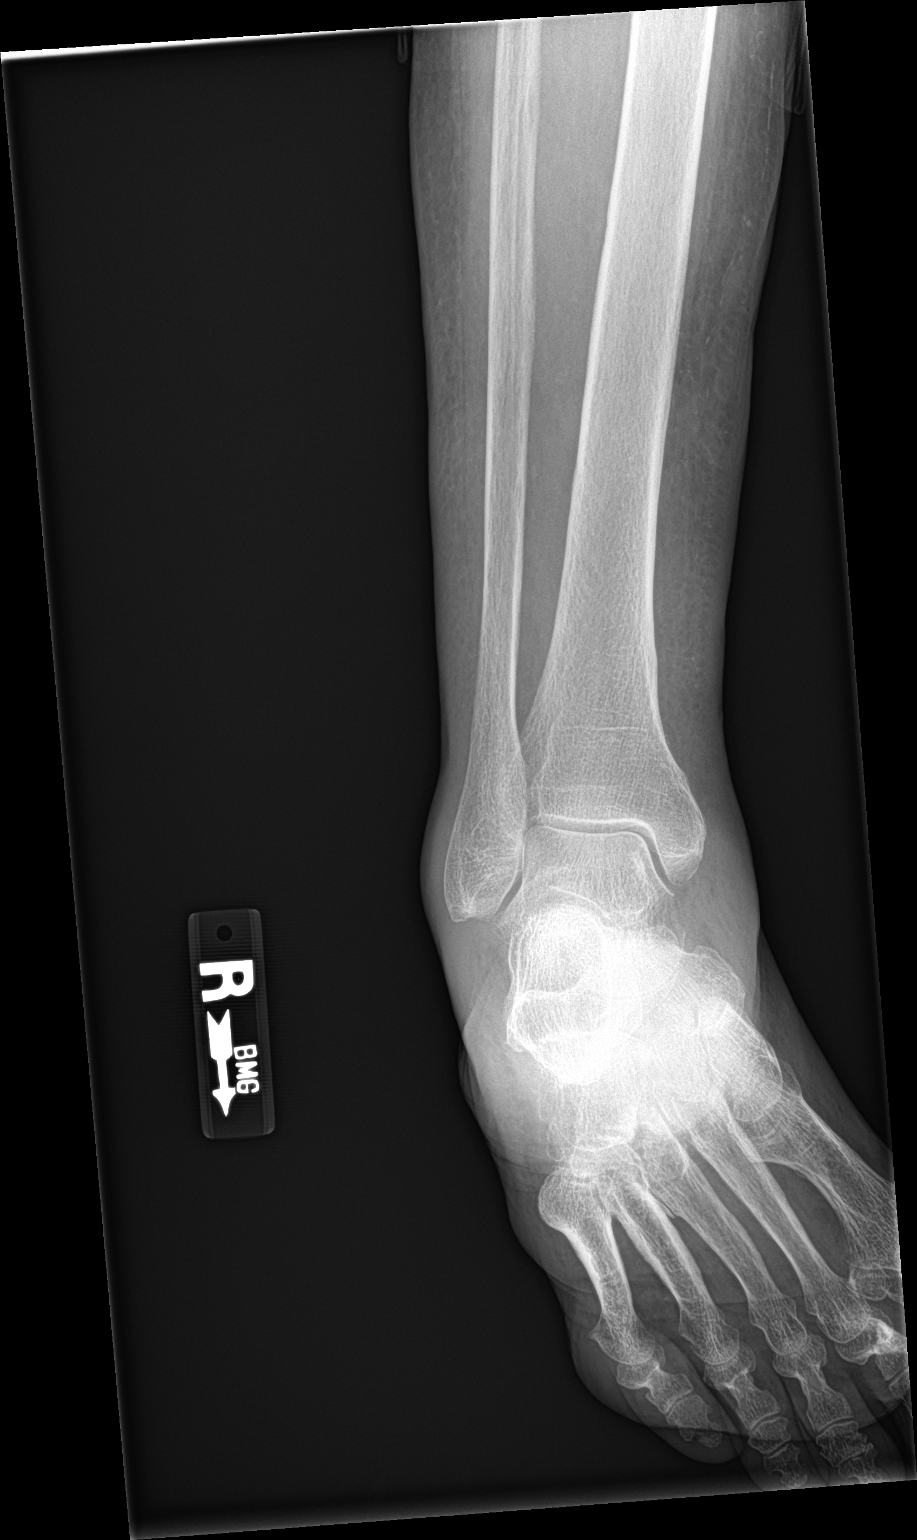

[ankle obl]
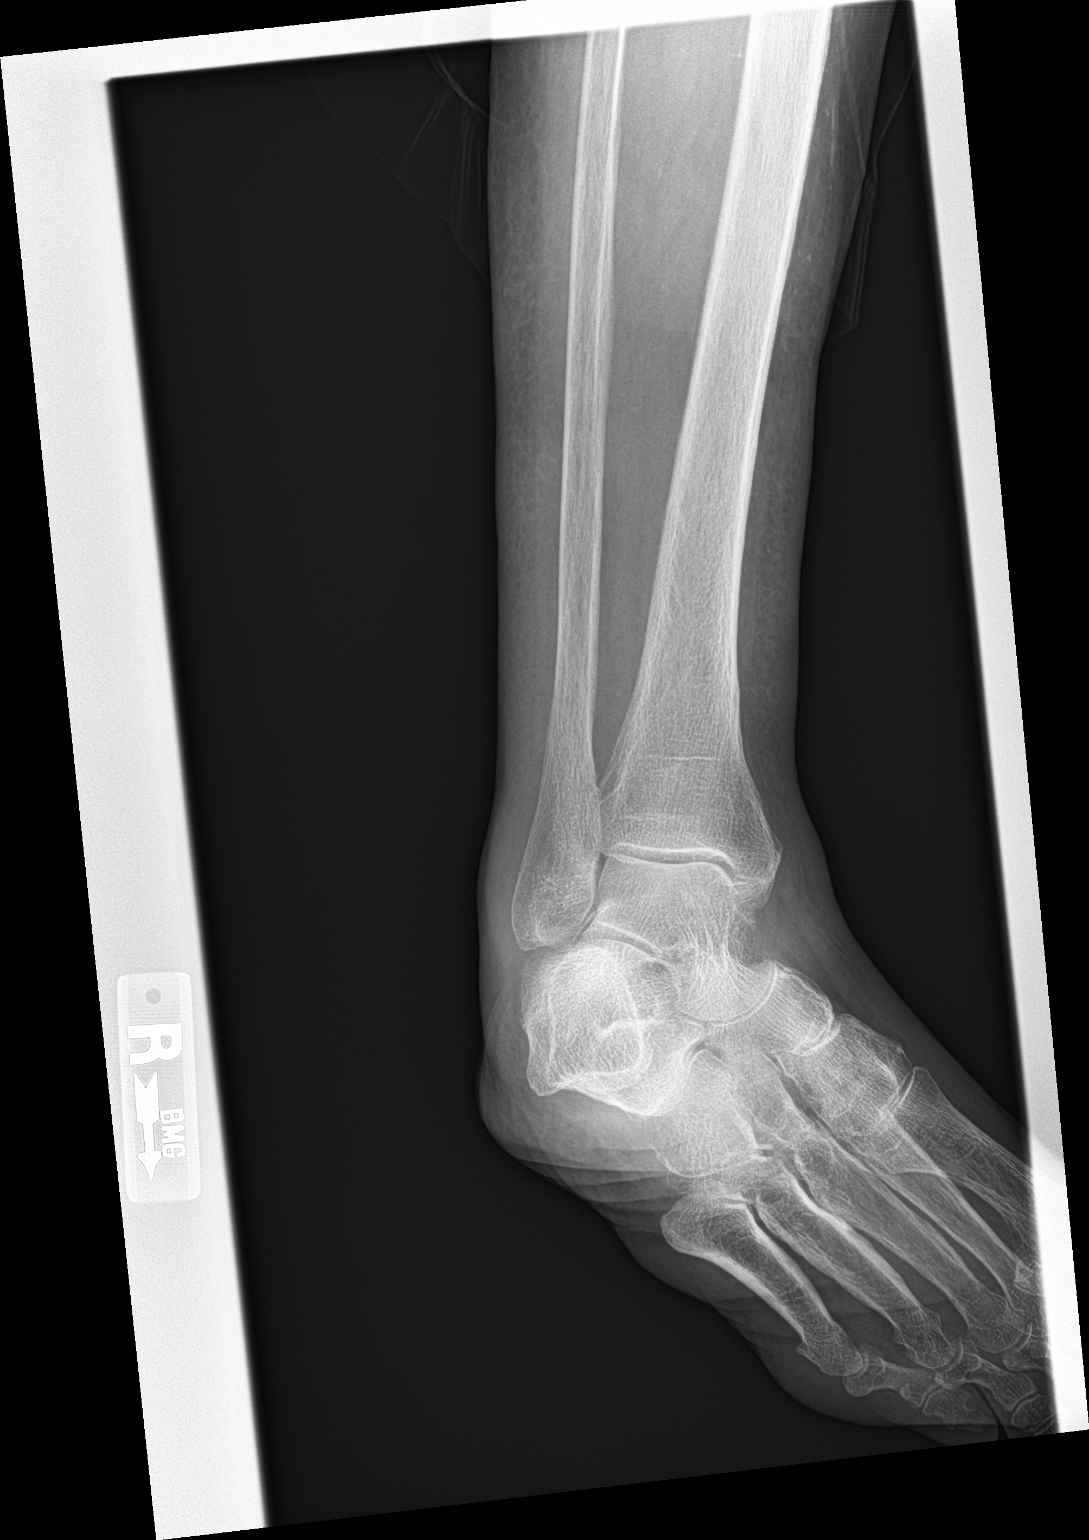

[ankle lat]
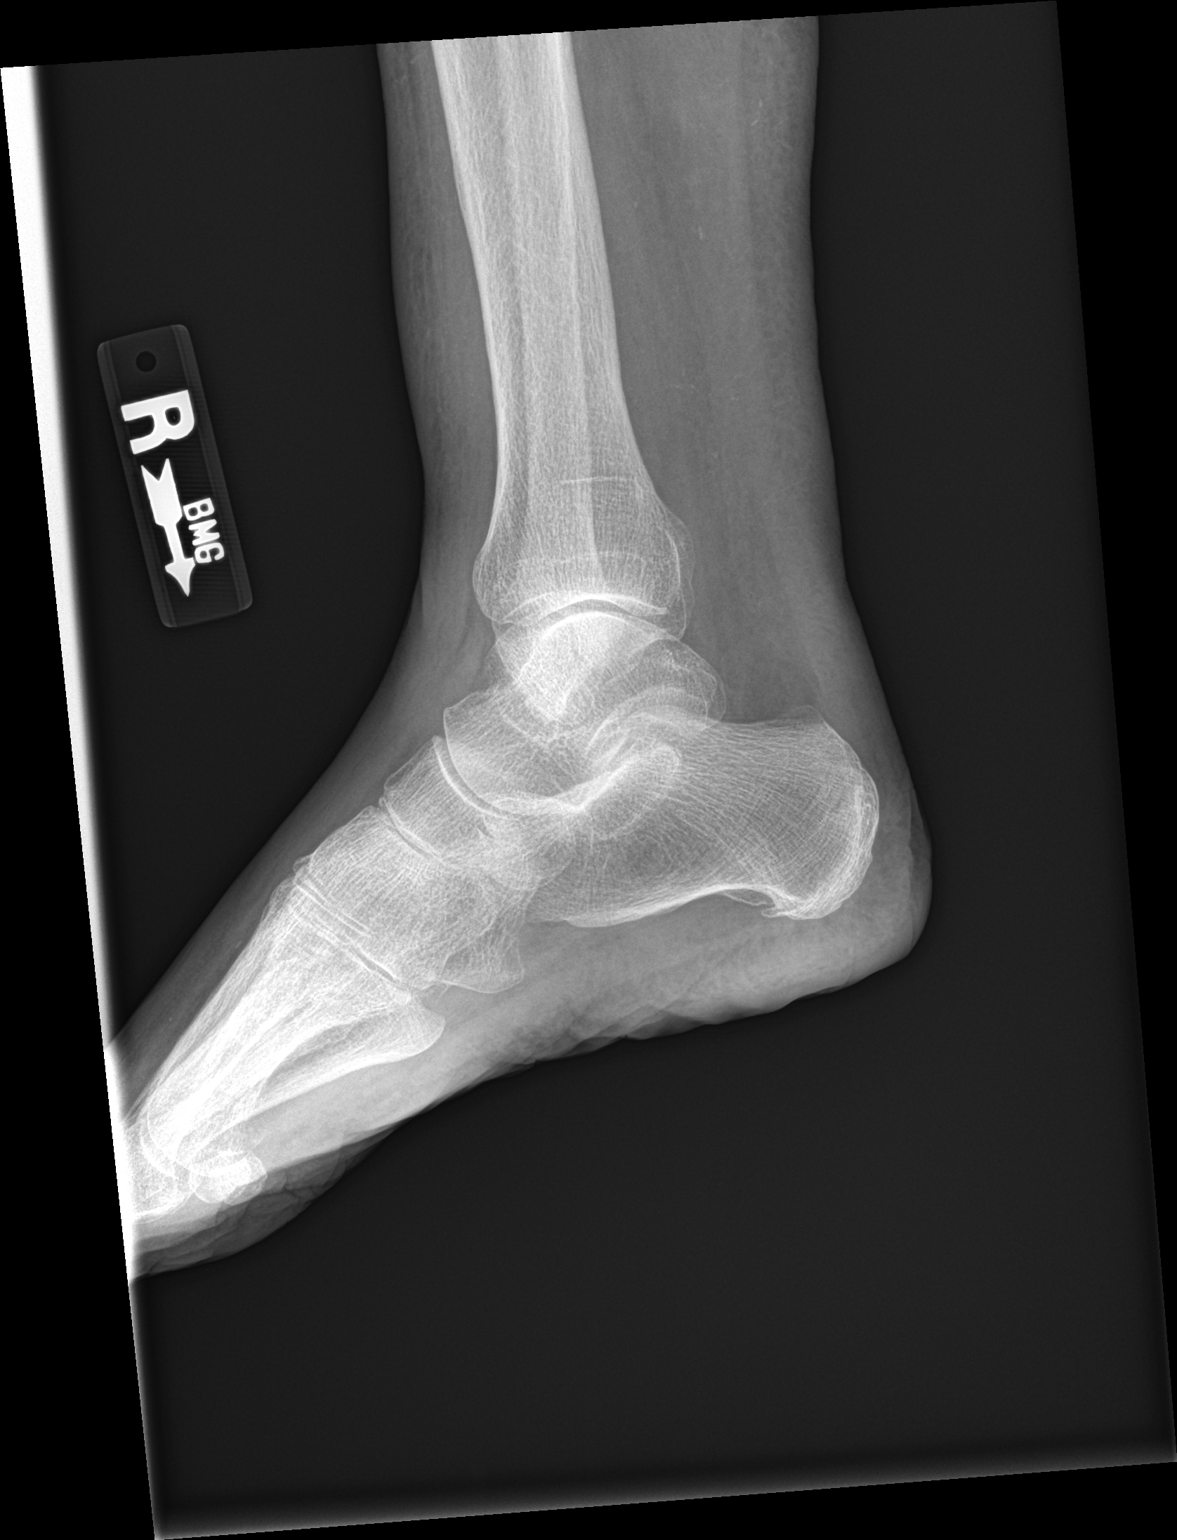

[3 of 3 positions shown; findings below may reference images not displayed]

FINDINGS: There is no acute fracture or dislocation. The bones are osteopenic.
The ankle mortise is intact. There is diffuse subcutaneous edema and
mild soft tissue swelling of the ankle. No radiopaque foreign object
or soft tissue gas.
IMPRESSION: No acute fracture or dislocation.

## 2022-01-28 ENCOUNTER — Telehealth: Payer: Self-pay

## 2022-01-28 NOTE — Telephone Encounter (Signed)
[employer] client form received and placed on desk of Windell Moulding, NP

## 2022-02-02 ENCOUNTER — Ambulatory Visit: Payer: Medicare HMO | Admitting: Podiatry

## 2022-02-18 ENCOUNTER — Ambulatory Visit: Payer: Medicare HMO | Admitting: Podiatry

## 2022-03-01 ENCOUNTER — Encounter: Payer: Self-pay | Admitting: Podiatry

## 2022-03-01 ENCOUNTER — Ambulatory Visit (INDEPENDENT_AMBULATORY_CARE_PROVIDER_SITE_OTHER): Payer: Medicare HMO | Admitting: Podiatry

## 2022-03-01 DIAGNOSIS — M79674 Pain in right toe(s): Secondary | ICD-10-CM

## 2022-03-01 DIAGNOSIS — B351 Tinea unguium: Secondary | ICD-10-CM

## 2022-03-01 DIAGNOSIS — G629 Polyneuropathy, unspecified: Secondary | ICD-10-CM

## 2022-03-01 DIAGNOSIS — M79675 Pain in left toe(s): Secondary | ICD-10-CM | POA: Diagnosis not present

## 2022-03-01 NOTE — Progress Notes (Signed)
  Subjective:  Patient ID: Kayla Lloyd, female    DOB: 01-15-1924,   MRN: [id number]  Chief Complaint  Patient presents with   Nail Problem     Routine foot care     89 y.o. female presents for concern of thickened elongated and painful nails that are difficult to trim. Requesting to have them trimmed today. Relates burning and tingling in their feet. Patient is not diabetic but does have neuropathy  PCP:  [name], NP    . Denies any other pedal complaints. Denies n/v/f/c.   Past Medical History:  Diagnosis Date   Anxiety    Arthritis    Phreesia 03/05/2020   Bowel obstruction ([facility]) 1950   Bursitis    Cancer of septum of nose ([facility])    melanoma   Constipation    At times   Depression    GERD (gastroesophageal reflux disease)    History of open leg wound    Hypertension    Hypothyroidism    Osteoporosis    Phreesia 03/05/2020   Swallowing difficulty    Thyroid activity decreased    Torn rotator cuff     Objective:  Physical Exam: Vascular: DP/PT pulses 2/4 bilateral. CFT <3 seconds. Absent hair growth on digits. Edema noted to bilateral lower extremities. Xerosis noted bilaterally.  Skin. No lacerations or abrasions bilateral feet. Nails 1-5 bilateral  are thickened discolored and elongated with subungual debris.  Musculoskeletal: MMT 5/5 bilateral lower extremities in DF, PF, Inversion and Eversion. Deceased ROM in DF of ankle joint.  Neurological: Sensation intact to light touch. Protective sensation diminished bilateral.    Assessment:   1. Pain due to onychomycosis of toenails of both feet   2. Peripheral polyneuropathy      Plan:  Patient was evaluated and treated and all questions answered. -Discussed and educated patient onfoot care, especially with  regards to the vascular, neurological and musculoskeletal systems.  -Discussed supportive shoes at all times and checking feet regularly.  -Mechanically debrided all nails 1-5 bilateral  using sterile nail nipper and filed with dremel without incident  -Answered all patient questions -Patient to return  in 3 months for at risk foot care -Patient advised to call the office if any problems or questions arise in the meantime.   Lorenda Peck, DPM

## 2022-03-08 ENCOUNTER — Ambulatory Visit: Payer: Medicare HMO | Admitting: Adult Health

## 2022-03-11 ENCOUNTER — Ambulatory Visit: Payer: Medicare HMO | Admitting: Orthopedic Surgery

## 2022-03-18 ENCOUNTER — Encounter: Payer: Self-pay | Admitting: Orthopedic Surgery

## 2022-03-18 ENCOUNTER — Ambulatory Visit (INDEPENDENT_AMBULATORY_CARE_PROVIDER_SITE_OTHER): Payer: Medicare HMO | Admitting: Orthopedic Surgery

## 2022-03-18 VITALS — BP 122/78 | HR 85 | Temp 97.6°F | Resp 17 | Ht 59.0 in | Wt 119.0 lb

## 2022-03-18 DIAGNOSIS — F419 Anxiety disorder, unspecified: Secondary | ICD-10-CM | POA: Diagnosis not present

## 2022-03-18 DIAGNOSIS — J3489 Other specified disorders of nose and nasal sinuses: Secondary | ICD-10-CM | POA: Diagnosis not present

## 2022-03-18 DIAGNOSIS — H3530 Unspecified macular degeneration: Secondary | ICD-10-CM

## 2022-03-18 DIAGNOSIS — G629 Polyneuropathy, unspecified: Secondary | ICD-10-CM

## 2022-03-18 DIAGNOSIS — R1313 Dysphagia, pharyngeal phase: Secondary | ICD-10-CM

## 2022-03-18 DIAGNOSIS — K219 Gastro-esophageal reflux disease without esophagitis: Secondary | ICD-10-CM | POA: Diagnosis not present

## 2022-03-18 DIAGNOSIS — E039 Hypothyroidism, unspecified: Secondary | ICD-10-CM | POA: Diagnosis not present

## 2022-03-18 DIAGNOSIS — M810 Age-related osteoporosis without current pathological fracture: Secondary | ICD-10-CM

## 2022-03-18 DIAGNOSIS — D692 Other nonthrombocytopenic purpura: Secondary | ICD-10-CM

## 2022-03-18 DIAGNOSIS — I10 Essential (primary) hypertension: Secondary | ICD-10-CM

## 2022-03-18 NOTE — Patient Instructions (Signed)
ENT referral made  Please let me know if you continue to cough after eating/drinking more  Take medication with apple sauce or pudding to help with swallowing

## 2022-03-18 NOTE — Progress Notes (Signed)
Careteam: Patient Care Team: Kayla Alanis, NP as PCP - General (Adult Health Nurse Practitioner)  Seen by: Windell Moulding, AGNP-C  PLACE OF SERVICE:  [place]  Advanced Directive information    Allergies  Allergen Reactions   Brimonidine    Childrens Non-Aspirin [Acetaminophen]    Codeine Other (See Comments)   Levofloxacin Other (See Comments)   Lidocaine    Lipitor [Atorvastatin]    Meloxicam    Myrbetriq Kayla Lloyd]    Oxycodone    Pregabalin    Sulfa Antibiotics Other (See Comments)   Timolol Maleate    Zolpidem    Gabapentin Rash    Pt did not like the way it made her feel    Chief Complaint  Patient presents with   Medical Management of Chronic Issues    6 month follow up.   Health Maintenance    Discuss the need for AWV.   Immunizations    Discuss the need for DTAP vaccine, Shingrix vaccine, and Influenza vaccine.     HPI: Patient is a 89 y.o. female seen today for medical management of chronic conditions.   Granddaughter present during encounter. She is now her permanent caregiver 24/7.   Polyneuropathy stable with Cymbalta and rubbing Aspercreme nightly.   Eating 2 meals daily. Drinks 1-2 Ensures daily.   Does not want Shingrix vaccine.   Tdap offered in office but not covered without additional fee. They plan to get at local pharmacy.   Trouble swallowing > swallow study 2019> pharyngeal dysphagia. Sometimes pills get hung. Discussed taking medications with pudding or apple sauce.   Recently saw dermatology> lesion in left nostril> also having runny nose and trouble breathing> requesting ENT referral.   Daughter working on living will and advanced directives.     Review of Systems:  Review of Systems  Constitutional:  Negative for chills and fever.  HENT:  Positive for hearing loss. Negative for sore throat.   Eyes:  Negative for blurred vision.  Respiratory:  Negative for cough, shortness of breath and wheezing.   Cardiovascular:   Negative for chest pain and leg swelling.  Gastrointestinal:  Positive for constipation. Negative for abdominal pain, blood in stool and heartburn.  Genitourinary:  Negative for dysuria.  Musculoskeletal:  Positive for falls and joint pain.  Skin:  Negative for rash.       Fragile skin  Neurological:  Positive for sensory change and weakness. Negative for dizziness and headaches.  Psychiatric/Behavioral:  Negative for depression. The patient is not nervous/anxious.     Past Medical History:  Diagnosis Date   Anxiety    Arthritis    Phreesia 03/05/2020   Bowel obstruction ([place]) 1950   Bursitis    Cancer of septum of nose (HCC)    melanoma   Constipation    At times   Depression    GERD (gastroesophageal reflux disease)    History of open leg wound    Hypertension    Hypothyroidism    Osteoporosis    Phreesia 03/05/2020   Swallowing difficulty    Thyroid activity decreased    Torn rotator cuff    Past Surgical History:  Procedure Laterality Date   ABDOMINAL HYSTERECTOMY N/A    Phreesia [id number]   APPLICATION OF A-CELL OF EXTREMITY Left 10/17/2019   Procedure: APPLICATION OF INTEGRA BILAYER WOUND MATRIX OF EXTREMITY;  Surgeon: Wallace Going, DO;  Location: [facility];  Service: Plastics;  Laterality: Left;   BIOPSY  12/07/2017   Procedure: BIOPSY;  Surgeon: Irving Copas., MD;  Location: [facility];  Service: Gastroenterology;;   CHOLECYSTECTOMY     ESOPHAGOGASTRODUODENOSCOPY (EGD) WITH PROPOFOL N/A 12/07/2017   Procedure: ESOPHAGOGASTRODUODENOSCOPY (EGD) WITH PROPOFOL;  Surgeon: Irving Copas., MD;  Location: [facility];  Service: Gastroenterology;  Laterality: N/A;  May need Dilation   HERNIA REPAIR N/A    Phreesia 03/05/2020   hysterecrtomy     I & D EXTREMITY Left 10/17/2019   Procedure: IRRIGATION AND DEBRIDEMENT EXTREMITY;  Surgeon: Wallace Going, DO;  Location: [facility];  Service: Plastics;   Laterality: Left;  45 min   JOINT REPLACEMENT N/A    Phreesia 03/05/2020   left hip surgery     ROTATOR CUFF REPAIR Right    SMALL INTESTINE SURGERY     Social History:   reports that she has never smoked. She has never used smokeless tobacco. She reports that she does not drink alcohol and does not use drugs.  Family History  Problem Relation Age of Onset   Uterine cancer Mother    Hypertension Father    Kidney failure Brother    Cancer Son    Cancer Daughter    Colon cancer Neg Hx    Esophageal cancer Neg Hx    Liver disease Neg Hx    Inflammatory bowel disease Neg Hx    Pancreatic cancer Neg Hx    Rectal cancer Neg Hx    Stomach cancer Neg Hx     Medications: Patient's Medications  New Prescriptions   No medications on file  Previous Medications   ACETAMINOPHEN (TYLENOL) 325 MG TABLET    Take 325 mg by mouth as needed.   ACETAMINOPHEN (TYLENOL) 500 MG TABLET    Take 500 mg by mouth as needed.   ALPRAZOLAM (XANAX) 0.25 MG TABLET    Take 1 tablet (0.25 mg total) by mouth 2 (two) times daily as needed for anxiety.   B COMPLEX VITAMINS CAPSULE    Take 1 capsule by mouth daily.   BISMUTH TRIBROMOPH-PETROLATUM (XEROFORM PETROLAT PATCH 4"X4") PADS    Apply 1 application topically daily.   CETIRIZINE (ZYRTEC) 10 MG TABLET    TAKE 1 TABLET BY MOUTH EVERY DAY   CHOLECALCIFEROL (CVS D3) 125 MCG (5000 UT) CAPSULE    Take 5,000 Units by mouth daily.   COENZYME Q10 (COQ10) 100 MG CAPS    Take by mouth.   CONJUGATED ESTROGENS (PREMARIN) VAGINAL CREAM    0.5 mg vaginally daily x 2 weeks then twice weekly for maintenance   CYANOCOBALAMIN 1000 MCG/ML LIQD    Take 1 tablet by mouth daily in the afternoon.   DOCUSATE SODIUM (COLACE) 100 MG CAPSULE    Take 100 mg by mouth as needed.   DULOXETINE (CYMBALTA) 60 MG CAPSULE    Take 1 capsule (60 mg total) by mouth daily.   HYDROCORTISONE CREAM 0.5 %    Apply 1 Application topically 2 (two) times daily.   LACTOBACILLUS (PROBIOTIC ACIDOPHILUS PO)     Take 1 capsule by mouth daily.   LEVOTHYROXINE (SYNTHROID) 75 MCG TABLET    TAKE 1 TABLET BY MOUTH EVERY DAY   LOPERAMIDE (IMODIUM) 2 MG CAPSULE    Take by mouth as needed for diarrhea or loose stools.   LOSARTAN (COZAAR) 50 MG TABLET    TAKE 1 TABLET BY MOUTH EVERY DAY   MAGNESIUM GLUCONATE 27.5 MG TABS    Take 500 mg by mouth daily in the  afternoon.   MENTHOL, TOPICAL ANALGESIC, 154 MG PADS    Place 1 each onto the skin daily as needed (for hip pain.).   MULTIPLE VITAMINS-MINERALS (PRESERVISION AREDS 2 PO)    Take by mouth.   NON FORMULARY    MV-MIN-Folic Acid-Lutein 99991111 mcg Chew take by mouth   NON FORMULARY    Ubidecarenone-Omega 3-vit E 25-150-200   NUTRITIONAL SUPPLEMENTS (FEEDING SUPPLEMENT, PEDIASURE 1.5,) LIQD LIQUID    Take 237 mLs by mouth 2 (two) times daily between meals.   PANTOPRAZOLE (PROTONIX) 40 MG TABLET    TAKE 1 TABLET BY MOUTH 2 TIMES DAILY   PROBIOTIC PRODUCT (DAILY PROBIOTIC PO)    Take by mouth.   PROLIA 60 MG/ML SOSY INJECTION    Take 60 mg by mouth every 6 (six) months.   SIMETHICONE (GAS RELIEF PO)    Take by mouth.   TROLAMINE SALICYLATE (ASPERCREME) 10 % CREAM    Apply 1 application topically as needed for muscle pain.   WHITE PETROLATUM (VASELINE) GEL    Apply 1 Application topically as needed for lip care.  Modified Medications   No medications on file  Discontinued Medications   No medications on file    Physical Exam:  There were no vitals filed for this visit. There is no height or weight on file to calculate BMI. Wt Readings from Last 3 Encounters:  10/05/21 121 lb 9.6 oz (55.2 kg)  09/29/21 115 lb (52.2 kg)  03/19/21 115 lb 9.6 oz (52.4 kg)    Physical Exam Vitals reviewed.  Constitutional:      General: She is not in acute distress. HENT:     Head: Normocephalic.  Eyes:     General:        Right eye: No discharge.        Left eye: No discharge.  Neck:     Vascular: No carotid bruit.  Cardiovascular:     Rate and Rhythm: Normal  rate and regular rhythm.     Heart sounds: Murmur heard.  Pulmonary:     Effort: Pulmonary effort is normal. No respiratory distress.     Breath sounds: Normal breath sounds. No wheezing.  Abdominal:     General: Bowel sounds are normal. There is no distension.     Palpations: Abdomen is soft.     Tenderness: There is no abdominal tenderness.  Musculoskeletal:     Cervical back: Neck supple.     Right lower leg: No edema.     Left lower leg: No edema.  Lymphadenopathy:     Cervical: No cervical adenopathy.  Skin:    General: Skin is warm and dry.     Capillary Refill: Capillary refill takes less than 2 seconds.     Comments: Non tender purple lesions to extremities, they all vary in size, no skin breakdown. Overall skin thin and fragile. Geri sleeves to lower legs.   Neurological:     General: No focal deficit present.     Mental Status: She is alert. Mental status is at baseline.     Motor: Weakness present.     Gait: Gait abnormal.     Comments: wheelchair  Psychiatric:        Mood and Affect: Mood normal.        Behavior: Behavior normal.     Labs reviewed: Basic Metabolic Panel: Recent Labs    03/19/21 1353  NA 131*  K 4.1  CL 98  CO2 27  GLUCOSE 79  BUN 17  CREATININE 0.61  CALCIUM 8.2*  TSH 1.17   Liver Function Tests: Recent Labs    03/19/21 1353  AST 15  ALT 8  BILITOT 0.5  PROT 5.4*   No results for input(s): "LIPASE", "AMYLASE" in the last 8760 hours. No results for input(s): "AMMONIA" in the last 8760 hours. CBC: Recent Labs    03/19/21 1353  WBC 5.3  NEUTROABS 3,535  HGB 12.3  HCT 36.1  MCV 98.6  PLT 164   Lipid Panel: No results for input(s): "CHOL", "HDL", "LDLCALC", "TRIG", "CHOLHDL", "LDLDIRECT" in the last 8760 hours. TSH: Recent Labs    03/19/21 1353  TSH 1.17   A1C: No results found for: "HGBA1C"   Assessment/Plan 1. Essential hypertension - controlled - BUN/creat 17/0.61 02/2021 - cont losartan - CBC with  Differential/Platelet - Complete Metabolic Panel with eGFR  2. Acquired hypothyroidism - TSH 1.17 02/2021 - cont levothyroxine - TSH  3. Polyneuropathy - improved with Cymbalta  4. Anxiety - stable with xanax prn and Cymbalta  5. Gastroesophageal reflux disease without esophagitis - hgb stable  - cont Protonix  6. Age related osteoporosis, unspecified pathological fracture presence - cont Prolia and vitamin D - discuss DEXA next visit  7. Macular degeneration, age related - cont falls safety precautions  8. Senile purpura ([place]) - education given   9. Lesion of nostril - noted by recent dermatology appointment - Ambulatory referral to ENT  10 Pharyngeal dysphagia - no recent aspirations - swallow study 2019 - recommend taking medications with applesauce/pudding   Total time: 36 minutes. Greater than 50% of total time spent doing patient education regarding HTN, hypothyroidism, GERD, falls safety, swallowing issues including symptom/medication management    Next appt: Visit date not found  [place], [facility] [phone]

## 2022-03-19 LAB — COMPLETE METABOLIC PANEL WITH GFR
AG Ratio: 1.9 (calc) (ref 1.0–2.5)
ALT: 9 U/L (ref 6–29)
AST: 17 U/L (ref 10–35)
Albumin: 3.8 g/dL (ref 3.6–5.1)
Alkaline phosphatase (APISO): 64 U/L (ref 37–153)
BUN: 20 mg/dL (ref 7–25)
CO2: 30 mmol/L (ref 20–32)
Calcium: 9.1 mg/dL (ref 8.6–10.4)
Chloride: 96 mmol/L — ABNORMAL LOW (ref 98–110)
Creat: 0.67 mg/dL (ref 0.60–0.95)
Globulin: 2 g/dL (calc) (ref 1.9–3.7)
Glucose, Bld: 76 mg/dL (ref 65–99)
Potassium: 4.6 mmol/L (ref 3.5–5.3)
Sodium: 133 mmol/L — ABNORMAL LOW (ref 135–146)
Total Bilirubin: 0.4 mg/dL (ref 0.2–1.2)
Total Protein: 5.8 g/dL — ABNORMAL LOW (ref 6.1–8.1)
eGFR: 80 mL/min/{1.73_m2} (ref >=60)

## 2022-03-19 LAB — CBC WITH DIFFERENTIAL/PLATELET
Absolute Monocytes: 726 cells/uL (ref 200–950)
Basophils Absolute: 83 cells/uL (ref 0–200)
Basophils Relative: 1.4 %
Eosinophils Absolute: 142 cells/uL (ref 15–500)
Eosinophils Relative: 2.4 %
HCT: 34 % — ABNORMAL LOW (ref 35.0–45.0)
Hemoglobin: 11.6 g/dL — ABNORMAL LOW (ref 11.7–15.5)
Lymphs Abs: 1239 cells/uL (ref 850–3900)
MCH: 35.2 pg — ABNORMAL HIGH (ref 27.0–33.0)
MCHC: 34.1 g/dL (ref 32.0–36.0)
MCV: 103 fL — ABNORMAL HIGH (ref 80.0–100.0)
MPV: 10.5 fL (ref 7.5–12.5)
Monocytes Relative: 12.3 %
Neutro Abs: 3711 cells/uL (ref 1500–7800)
Neutrophils Relative %: 62.9 %
Platelets: 156 10*3/uL (ref 140–400)
RBC: 3.3 10*6/uL — ABNORMAL LOW (ref 3.80–5.10)
RDW: 12.6 % (ref 11.0–15.0)
Total Lymphocyte: 21 %
WBC: 5.9 10*3/uL (ref 3.8–10.8)

## 2022-03-19 LAB — TSH: TSH: 3.83 mIU/L (ref 0.40–4.50)

## 2022-03-23 DIAGNOSIS — R32 Unspecified urinary incontinence: Secondary | ICD-10-CM | POA: Diagnosis not present

## 2022-03-24 ENCOUNTER — Other Ambulatory Visit: Payer: Self-pay | Admitting: Family

## 2022-03-24 ENCOUNTER — Other Ambulatory Visit: Payer: Self-pay | Admitting: Orthopedic Surgery

## 2022-03-24 DIAGNOSIS — G629 Polyneuropathy, unspecified: Secondary | ICD-10-CM

## 2022-03-24 DIAGNOSIS — E039 Hypothyroidism, unspecified: Secondary | ICD-10-CM

## 2022-03-24 DIAGNOSIS — F419 Anxiety disorder, unspecified: Secondary | ICD-10-CM

## 2022-03-24 DIAGNOSIS — K219 Gastro-esophageal reflux disease without esophagitis: Secondary | ICD-10-CM

## 2022-03-24 NOTE — Telephone Encounter (Signed)
Patient has request refill on Xanax. Patient medication last refilled 10/15/2021. Patient has NO contract on file. Patient has upcoming appointment dated 09/16/2022. Sign Contract added to patient appointment note. Medication pend and sent to PCP [name], NP for approval.

## 2022-03-24 NOTE — Telephone Encounter (Signed)
Medication pend and sent to PCP [name], NP notes on medications saying they failed

## 2022-04-13 DIAGNOSIS — J3489 Other specified disorders of nose and nasal sinuses: Secondary | ICD-10-CM | POA: Diagnosis not present

## 2022-04-13 DIAGNOSIS — J302 Other seasonal allergic rhinitis: Secondary | ICD-10-CM | POA: Diagnosis not present

## 2022-04-13 DIAGNOSIS — R1313 Dysphagia, pharyngeal phase: Secondary | ICD-10-CM | POA: Diagnosis not present

## 2022-04-13 DIAGNOSIS — Z85828 Personal history of other malignant neoplasm of skin: Secondary | ICD-10-CM | POA: Diagnosis not present

## 2022-05-13 DIAGNOSIS — H353123 Nonexudative age-related macular degeneration, left eye, advanced atrophic without subfoveal involvement: Secondary | ICD-10-CM | POA: Diagnosis not present

## 2022-05-13 DIAGNOSIS — H353114 Nonexudative age-related macular degeneration, right eye, advanced atrophic with subfoveal involvement: Secondary | ICD-10-CM | POA: Diagnosis not present

## 2022-05-13 DIAGNOSIS — H35373 Puckering of macula, bilateral: Secondary | ICD-10-CM | POA: Diagnosis not present

## 2022-05-13 DIAGNOSIS — H35033 Hypertensive retinopathy, bilateral: Secondary | ICD-10-CM | POA: Diagnosis not present

## 2022-05-24 ENCOUNTER — Encounter: Payer: Self-pay | Admitting: Orthopedic Surgery

## 2022-05-25 ENCOUNTER — Other Ambulatory Visit: Payer: Self-pay | Admitting: Orthopedic Surgery

## 2022-05-25 DIAGNOSIS — G629 Polyneuropathy, unspecified: Secondary | ICD-10-CM

## 2022-05-25 DIAGNOSIS — R54 Age-related physical debility: Secondary | ICD-10-CM

## 2022-05-25 DIAGNOSIS — R2681 Unsteadiness on feet: Secondary | ICD-10-CM

## 2022-05-25 DIAGNOSIS — R531 Weakness: Secondary | ICD-10-CM

## 2022-05-31 ENCOUNTER — Ambulatory Visit (INDEPENDENT_AMBULATORY_CARE_PROVIDER_SITE_OTHER): Payer: Medicare HMO | Admitting: Podiatry

## 2022-05-31 ENCOUNTER — Encounter: Payer: Self-pay | Admitting: Podiatry

## 2022-05-31 DIAGNOSIS — G629 Polyneuropathy, unspecified: Secondary | ICD-10-CM | POA: Diagnosis not present

## 2022-05-31 DIAGNOSIS — B351 Tinea unguium: Secondary | ICD-10-CM

## 2022-05-31 DIAGNOSIS — M79675 Pain in left toe(s): Secondary | ICD-10-CM | POA: Diagnosis not present

## 2022-05-31 DIAGNOSIS — M79674 Pain in right toe(s): Secondary | ICD-10-CM | POA: Diagnosis not present

## 2022-05-31 NOTE — Progress Notes (Signed)
  Subjective:  Patient ID: Kayla Lloyd, female    DOB: 08/19/1923,   MRN: [id number]  Chief Complaint  Patient presents with   Nail Problem     Routine foot care     89 y.o. female presents for concern of thickened elongated and painful nails that are difficult to trim. Requesting to have them trimmed today. Relates burning and tingling in their feet. Patient is not diabetic but does have neuropathy  PCP:  [place], Amy E, NP    . Denies any other pedal complaints. Denies n/v/f/c.   Past Medical History:  Diagnosis Date   Anxiety    Arthritis    Phreesia 03/05/2020   Bowel obstruction (HCC) 1950   Bursitis    Cancer of septum of nose (HCC)    melanoma   Constipation    At times   Depression    GERD (gastroesophageal reflux disease)    History of open leg wound    Hypertension    Hypothyroidism    Osteoporosis    Phreesia 03/05/2020   Swallowing difficulty    Thyroid activity decreased    Torn rotator cuff     Objective:  Physical Exam: Vascular: DP/PT pulses 2/4 bilateral. CFT <3 seconds. Absent hair growth on digits. Edema noted to bilateral lower extremities. Xerosis noted bilaterally.  Skin. No lacerations or abrasions bilateral feet. Nails 1-5 bilateral  are thickened discolored and elongated with subungual debris.  Musculoskeletal: MMT 5/5 bilateral lower extremities in DF, PF, Inversion and Eversion. Deceased ROM in DF of ankle joint.  Neurological: Sensation intact to light touch. Protective sensation diminished bilateral.    Assessment:   1. Pain due to onychomycosis of toenails of both feet   2. Peripheral polyneuropathy      Plan:  Patient was evaluated and treated and all questions answered. -Discussed and educated patient onfoot care, especially with  regards to the vascular, neurological and musculoskeletal systems.  -Discussed supportive shoes at all times and checking feet regularly.  -Mechanically debrided all nails 1-5 bilateral  using sterile nail nipper and filed with dremel without incident  -Answered all patient questions -Patient to return  in 3 months for at risk foot care -Patient advised to call the office if any problems or questions arise in the meantime.   Tyreik Delahoussaye, DPM    

## 2022-06-07 ENCOUNTER — Encounter: Payer: Self-pay | Admitting: Orthopedic Surgery

## 2022-06-08 ENCOUNTER — Encounter: Payer: Self-pay | Admitting: Orthopedic Surgery

## 2022-06-14 ENCOUNTER — Other Ambulatory Visit: Payer: Self-pay | Admitting: Orthopedic Surgery

## 2022-06-14 DIAGNOSIS — I10 Essential (primary) hypertension: Secondary | ICD-10-CM

## 2022-06-30 ENCOUNTER — Encounter: Payer: Self-pay | Admitting: Orthopedic Surgery

## 2022-07-01 ENCOUNTER — Ambulatory Visit (INDEPENDENT_AMBULATORY_CARE_PROVIDER_SITE_OTHER): Payer: Medicare HMO | Admitting: Orthopedic Surgery

## 2022-07-01 ENCOUNTER — Encounter: Payer: Self-pay | Admitting: Orthopedic Surgery

## 2022-07-01 VITALS — BP 98/60 | HR 75 | Temp 97.8°F | Resp 16 | Ht 59.0 in | Wt 124.2 lb

## 2022-07-01 DIAGNOSIS — H00014 Hordeolum externum left upper eyelid: Secondary | ICD-10-CM | POA: Diagnosis not present

## 2022-07-01 MED ORDER — ERYTHROMYCIN 5 MG/GM OP OINT: 1.0000 | TOPICAL_OINTMENT | Freq: Every day | OPHTHALMIC | 0 refills | Status: AC

## 2022-07-01 NOTE — Patient Instructions (Signed)
Recommend warm compress to eyes for 20 minutes 3-4x/daily x 7 days  Wash eyes with baby shampoo daily   Try above recommendations for a few days, if no improvement, start erythromycin ointment every night   Contact provider if symptoms do not improve or worsen

## 2022-07-01 NOTE — Progress Notes (Signed)
Careteam: Patient Care Team: Kayla Heir, NP as PCP - General (Adult Health Nurse Practitioner)  Seen by: Hazle Nordmann, AGNP-C  PLACE OF SERVICE:  [facility]  Advanced Directive information Does Patient Have a Medical Advance Directive?: Yes, Type of Advance Directive: Healthcare Power of [place];Living will, Does patient want to make changes to medical advance directive?: No - Patient declined  Allergies  Allergen Reactions   Brimonidine    Childrens Non-Aspirin [Acetaminophen]    Codeine Other (See Comments)   Levofloxacin Other (See Comments)   Lidocaine    Lipitor [Atorvastatin]    Meloxicam    Myrbetriq Theodosia Paling Er]    Oxycodone    Pregabalin    Sulfa Antibiotics Other (See Comments)   Timolol Maleate    Zolpidem    Gabapentin Rash    Pt did not like the way it made her feel    Chief Complaint  Patient presents with   Acute Visit    Patient complains of possible stye on left eye.      HPI: Patient is a 89 y.o. female seen today for acute visit due to left eye pain.   06/04 she woke up with left upper eyelid red and painful. Granddaughter tried applying allergy drops without success. Afebrile. Vitals stable.    Review of Systems:  Review of Systems  Constitutional:  Negative for fever and malaise/fatigue.  HENT:  Negative for congestion, ear pain and sore throat.   Eyes:  Positive for pain and redness.  Respiratory:  Negative for cough and shortness of breath.   Cardiovascular:  Negative for chest pain and leg swelling.  Musculoskeletal:  Negative for falls.  Psychiatric/Behavioral:  Negative for depression. The patient is not nervous/anxious.     Past Medical History:  Diagnosis Date   Anxiety    Arthritis    Phreesia 03/05/2020   Bowel obstruction (HCC) 1950   Bursitis    Cancer of septum of nose (HCC)    melanoma   Constipation    At times   Depression    GERD (gastroesophageal reflux disease)    History of open leg wound     Hypertension    Hypothyroidism    Osteoporosis    Phreesia 03/05/2020   Swallowing difficulty    Thyroid activity decreased    Torn rotator cuff    Past Surgical History:  Procedure Laterality Date   ABDOMINAL HYSTERECTOMY N/A    Phreesia 03/05/2020   APPLICATION OF A-CELL OF EXTREMITY Left 10/17/2019   Procedure: APPLICATION OF INTEGRA BILAYER WOUND MATRIX OF EXTREMITY;  Surgeon: Peggye Form, DO;  Location: [facility];  Service: Plastics;  Laterality: Left;   BIOPSY  12/07/2017   Procedure: BIOPSY;  Surgeon: Meridee Score Netty Starring., MD;  Location: [facility];  Service: Gastroenterology;;   CHOLECYSTECTOMY     ESOPHAGOGASTRODUODENOSCOPY (EGD) WITH PROPOFOL N/A 12/07/2017   Procedure: ESOPHAGOGASTRODUODENOSCOPY (EGD) WITH PROPOFOL;  Surgeon: Lemar Lofty., MD;  Location: [facility];  Service: Gastroenterology;  Laterality: N/A;  May need Dilation   HERNIA REPAIR N/A    Phreesia 03/05/2020   hysterecrtomy     I & D EXTREMITY Left 10/17/2019   Procedure: IRRIGATION AND DEBRIDEMENT EXTREMITY;  Surgeon: Peggye Form, DO;  Location: [facility];  Service: Plastics;  Laterality: Left;  45 min   JOINT REPLACEMENT N/A    Phreesia 03/05/2020   left hip surgery     ROTATOR CUFF REPAIR Right    SMALL  INTESTINE SURGERY     Social History:   reports that she has never smoked. She has never used smokeless tobacco. She reports current alcohol use of about 1.0 standard drink of alcohol per week. She reports that she does not use drugs.  Family History  Problem Relation Age of Onset   Uterine cancer Mother    Hypertension Father    Kidney failure Brother    Cancer Son    Cancer Daughter    Colon cancer Neg Hx    Esophageal cancer Neg Hx    Liver disease Neg Hx    Inflammatory bowel disease Neg Hx    Pancreatic cancer Neg Hx    Rectal cancer Neg Hx    Stomach cancer Neg Hx     Medications: Patient's Medications  New  Prescriptions   ERYTHROMYCIN OPHTHALMIC OINTMENT    Place 1 Application into the left eye at bedtime for 7 days.  Previous Medications   ACETAMINOPHEN (TYLENOL) 500 MG TABLET    Take 500 mg by mouth as needed.   ALPRAZOLAM (XANAX) 0.25 MG TABLET    TAKE 1 TABLET BY MOUTH TWICE DAILY AS NEEDED FOR ANXIETY   B COMPLEX VITAMINS CAPSULE    Take 1 capsule by mouth daily.   CETIRIZINE (ZYRTEC) 10 MG TABLET    TAKE 1 TABLET BY MOUTH EVERY DAY   CHOLECALCIFEROL (CVS D3) 125 MCG (5000 UT) CAPSULE    Take 5,000 Units by mouth daily.   COENZYME Q10 (COQ10) 100 MG CAPS    Take by mouth.   CONJUGATED ESTROGENS (PREMARIN) VAGINAL CREAM    0.5 mg vaginally daily x 2 weeks then twice weekly for maintenance   CYANOCOBALAMIN 1000 MCG/ML LIQD    Take 1 tablet by mouth daily in the afternoon.   DOCUSATE SODIUM (COLACE) 100 MG CAPSULE    Take 100 mg by mouth as needed.   DULOXETINE (CYMBALTA) 60 MG CAPSULE    TAKE 1 CAPSULE BY MOUTH 2 TIMES DAILY   HYDROCORTISONE CREAM 0.5 %    Apply 1 Application topically as needed.   LACTOBACILLUS (PROBIOTIC ACIDOPHILUS PO)    Take 1 capsule by mouth daily.   LEVOTHYROXINE (SYNTHROID) 75 MCG TABLET    TAKE 1 TABLET BY MOUTH EVERY DAY   LOPERAMIDE (IMODIUM) 2 MG CAPSULE    Take by mouth as needed for diarrhea or loose stools.   LOSARTAN (COZAAR) 50 MG TABLET    TAKE 1 TABLET BY MOUTH EVERY DAY   MAGNESIUM GLUCONATE 27.5 MG TABS    Take 500 mg by mouth as directed. With electrolytes drink   MENTHOL, TOPICAL ANALGESIC, 154 MG PADS    Place 1 each onto the skin daily as needed (for hip pain.).   NUTRITIONAL SUPPLEMENTS (FEEDING SUPPLEMENT, PEDIASURE 1.5,) LIQD LIQUID    Take 237 mLs by mouth 2 (two) times daily between meals.   PANTOPRAZOLE (PROTONIX) 40 MG TABLET    TAKE 1 TABLET BY MOUTH 2 TIMES DAILY   PROBIOTIC PRODUCT (DAILY PROBIOTIC PO)    Take by mouth.   PROLIA 60 MG/ML SOSY INJECTION    Take 60 mg by mouth every 6 (six) months.   SIMETHICONE (GAS RELIEF PO)    Take by  mouth.   TROLAMINE SALICYLATE (ASPERCREME) 10 % CREAM    Apply 1 application topically as needed for muscle pain.   WHITE PETROLATUM (VASELINE) GEL    Apply 1 Application topically as needed for lip care.  Modified Medications   No  medications on file  Discontinued Medications   ACETAMINOPHEN (TYLENOL) 325 MG TABLET    Take 325 mg by mouth as needed.   BISMUTH TRIBROMOPH-PETROLATUM (XEROFORM PETROLAT PATCH 4"X4") PADS    Apply 1 application topically daily.   MULTIPLE VITAMINS-MINERALS (PRESERVISION AREDS 2 PO)    Take by mouth.   NON FORMULARY    MV-MIN-Folic Acid-Lutein 500-250 mcg Chew take by mouth   NON FORMULARY    Ubidecarenone-Omega 3-vit E 25-150-200    Physical Exam:  Vitals:   07/01/22 1415  BP: 98/60  Pulse: 75  Resp: 16  Temp: 97.8 F (36.6 C)  SpO2: 93%  Weight: 124 lb 3.2 oz (56.3 kg)  Height: 4\' 11"  (1.499 m)   Body mass index is 25.09 kg/m. Wt Readings from Last 3 Encounters:  07/01/22 124 lb 3.2 oz (56.3 kg)  03/18/22 119 lb (54 kg)  10/05/21 121 lb 9.6 oz (55.2 kg)    Physical Exam Vitals reviewed.  Constitutional:      General: She is not in acute distress. HENT:     Head: Normocephalic.     Nose: Nose normal.     Mouth/Throat:     Mouth: Mucous membranes are moist.  Eyes:     General:        Right eye: No discharge.        Left eye: No discharge.     Comments: Left upper eye stye, approx 0.25 cm, no discharge/ deformity  Cardiovascular:     Rate and Rhythm: Normal rate and regular rhythm.     Pulses: Normal pulses.     Heart sounds: Normal heart sounds.  Pulmonary:     Effort: Pulmonary effort is normal.     Breath sounds: Normal breath sounds.  Musculoskeletal:     Cervical back: Neck supple.  Skin:    General: Skin is warm.     Capillary Refill: Capillary refill takes less than 2 seconds.  Neurological:     General: No focal deficit present.     Mental Status: She is alert.     Motor: Weakness present.     Gait: Gait abnormal.      Comments: wheelchair  Psychiatric:        Mood and Affect: Mood normal.     Labs reviewed: Basic Metabolic Panel: Recent Labs    03/18/22 1347  NA 133*  K 4.6  CL 96*  CO2 30  GLUCOSE 76  BUN 20  CREATININE 0.67  CALCIUM 9.1  TSH 3.83   Liver Function Tests: Recent Labs    03/18/22 1347  AST 17  ALT 9  BILITOT 0.4  PROT 5.8*   No results for input(s): "LIPASE", "AMYLASE" in the last 8760 hours. No results for input(s): "AMMONIA" in the last 8760 hours. CBC: Recent Labs    03/18/22 1347  WBC 5.9  NEUTROABS 3,711  HGB 11.6*  HCT 34.0*  MCV 103.0*  PLT 156   Lipid Panel: No results for input(s): "CHOL", "HDL", "LDLCALC", "TRIG", "CHOLHDL", "LDLDIRECT" in the last 8760 hours. TSH: Recent Labs    03/18/22 1347  TSH 3.83   A1C: No results found for: "HGBA1C"   Assessment/Plan 1. Hordeolum externum left upper eyelid - onset 06/04 - left upper stye approx 0.25 cm - start warm compresses 20 minutes, 3-4x/daily - wash every with baby shampoo daily - may start erythromycin ointment qhs if symptoms do not improve - erythromycin ophthalmic ointment; Place 1 Application into the left eye at bedtime  for 7 days.  Dispense: 3.5 g; Refill: 0  Total time: 12 minutes. Greater than 50% of total time spent doing patient education regarding left eye stye including symptom/medication management.     Next appt: 09/16/2022  Hazle Nordmann, Juel Burrow  [facility] [phone]

## 2022-07-15 DIAGNOSIS — M2550 Pain in unspecified joint: Secondary | ICD-10-CM | POA: Diagnosis not present

## 2022-07-15 DIAGNOSIS — Z79899 Other long term (current) drug therapy: Secondary | ICD-10-CM | POA: Diagnosis not present

## 2022-07-15 DIAGNOSIS — M1990 Unspecified osteoarthritis, unspecified site: Secondary | ICD-10-CM | POA: Diagnosis not present

## 2022-07-15 DIAGNOSIS — M810 Age-related osteoporosis without current pathological fracture: Secondary | ICD-10-CM | POA: Diagnosis not present

## 2022-07-15 DIAGNOSIS — M25511 Pain in right shoulder: Secondary | ICD-10-CM | POA: Diagnosis not present

## 2022-07-15 DIAGNOSIS — M25552 Pain in left hip: Secondary | ICD-10-CM | POA: Diagnosis not present

## 2022-07-15 DIAGNOSIS — M7062 Trochanteric bursitis, left hip: Secondary | ICD-10-CM | POA: Diagnosis not present

## 2022-07-15 LAB — CBC AND DIFFERENTIAL
HCT: 30 — AB (ref 36–46)
Hemoglobin: 9.5 — AB (ref 12.0–16.0)
Neutrophils Absolute: 2.3
Platelets: 171 10*3/uL (ref 150–400)
WBC: 4

## 2022-07-15 LAB — HEPATIC FUNCTION PANEL
ALT: 8 U/L (ref 7–35)
AST: 17 (ref 13–35)
Alkaline Phosphatase: 50 (ref 25–125)
Bilirubin, Total: 0.3

## 2022-07-15 LAB — BASIC METABOLIC PANEL
BUN: 17 (ref 4–21)
CO2: 26 — AB (ref 13–22)
Chloride: 99 (ref 99–108)
Creatinine: 0.7 (ref 0.5–1.1)
Glucose: 81
Potassium: 4.9 mEq/L (ref 3.5–5.1)
Sodium: 137 (ref 137–147)

## 2022-07-15 LAB — CBC: RBC: 3.41 — AB (ref 3.87–5.11)

## 2022-07-15 LAB — COMPREHENSIVE METABOLIC PANEL
Albumin: 4 (ref 3.5–5.0)
Calcium: 9.4 (ref 8.7–10.7)
Globulin: 1.9
eGFR: 79

## 2022-07-15 LAB — VITAMIN D 25 HYDROXY (VIT D DEFICIENCY, FRACTURES): Vit D, 25-Hydroxy: 85.1

## 2022-07-19 DIAGNOSIS — R399 Unspecified symptoms and signs involving the genitourinary system: Secondary | ICD-10-CM | POA: Diagnosis not present

## 2022-07-26 DIAGNOSIS — R32 Unspecified urinary incontinence: Secondary | ICD-10-CM | POA: Diagnosis not present

## 2022-08-26 DIAGNOSIS — R32 Unspecified urinary incontinence: Secondary | ICD-10-CM | POA: Diagnosis not present

## 2022-09-01 ENCOUNTER — Ambulatory Visit: Payer: Medicare HMO | Admitting: Podiatry

## 2022-09-16 ENCOUNTER — Ambulatory Visit: Payer: Medicare HMO | Admitting: Orthopedic Surgery

## 2022-09-20 ENCOUNTER — Ambulatory Visit: Payer: Medicare HMO | Admitting: Podiatry

## 2022-09-20 ENCOUNTER — Encounter: Payer: Self-pay | Admitting: Podiatry

## 2022-09-20 DIAGNOSIS — B351 Tinea unguium: Secondary | ICD-10-CM

## 2022-09-20 DIAGNOSIS — G629 Polyneuropathy, unspecified: Secondary | ICD-10-CM | POA: Diagnosis not present

## 2022-09-20 DIAGNOSIS — M79674 Pain in right toe(s): Secondary | ICD-10-CM

## 2022-09-20 DIAGNOSIS — M79675 Pain in left toe(s): Secondary | ICD-10-CM

## 2022-09-20 NOTE — Progress Notes (Signed)
  Subjective:  Patient ID: Kayla Lloyd, female    DOB: 27-Feb-1923,   MRN: [id number]  No chief complaint on file.   89 y.o. female presents for concern of thickened elongated and painful nails that are difficult to trim. Requesting to have them trimmed today. Relates burning and tingling in their feet. Patient is not diabetic but does have neuropathy  PCP:  [name], NP    . Denies any other pedal complaints. Denies n/v/f/c.   Past Medical History:  Diagnosis Date   Anxiety    Arthritis    Phreesia 03/05/2020   Bowel obstruction (HCC) 1950   Bursitis    Cancer of septum of nose (HCC)    melanoma   Constipation    At times   Depression    GERD (gastroesophageal reflux disease)    History of open leg wound    Hypertension    Hypothyroidism    Osteoporosis    Phreesia 03/05/2020   Swallowing difficulty    Thyroid activity decreased    Torn rotator cuff     Objective:  Physical Exam: Vascular: DP/PT pulses 2/4 bilateral. CFT <3 seconds. Absent hair growth on digits. Edema noted to bilateral lower extremities. Xerosis noted bilaterally.  Skin. No lacerations or abrasions bilateral feet. Nails 1-5 bilateral  are thickened discolored and elongated with subungual debris.  Musculoskeletal: MMT 5/5 bilateral lower extremities in DF, PF, Inversion and Eversion. Deceased ROM in DF of ankle joint.  Neurological: Sensation intact to light touch. Protective sensation diminished bilateral.    Assessment:   1. Pain due to onychomycosis of toenails of both feet   2. Peripheral polyneuropathy        Plan:  Patient was evaluated and treated and all questions answered. -Discussed and educated patient onfoot care, especially with  regards to the vascular, neurological and musculoskeletal systems.  -Discussed supportive shoes at all times and checking feet regularly.  -Mechanically debrided all nails 1-5 bilateral using sterile nail nipper and filed with dremel  without incident  -Answered all patient questions -Patient to return  in 3 months for at risk foot care -Patient advised to call the office if any problems or questions arise in the meantime.   Louann Sjogren, DPM

## 2022-09-21 ENCOUNTER — Other Ambulatory Visit: Payer: Self-pay | Admitting: Orthopedic Surgery

## 2022-09-21 DIAGNOSIS — G629 Polyneuropathy, unspecified: Secondary | ICD-10-CM

## 2022-09-21 DIAGNOSIS — K219 Gastro-esophageal reflux disease without esophagitis: Secondary | ICD-10-CM

## 2022-09-21 NOTE — Telephone Encounter (Signed)
  Ear, Nose, and Throat:  Antihistamines 2 Failed08/27/2024 01:10 PM  Protocol Details Valid encounter within last 12 months   Cr in normal range and within 360 days       Psychiatry: Antidepressants - SNRI - duloxetine Failed08/27/2024 01:10 PM  Protocol Details Valid encounter within last 6 months   Cr in normal range and within 360 days   eGFR is 30 or above and within 360 days   Completed PHQ-2 or PHQ-9 in the last 360 days   Last BP in normal range

## 2022-09-22 ENCOUNTER — Other Ambulatory Visit: Payer: Self-pay | Admitting: Orthopedic Surgery

## 2022-09-22 DIAGNOSIS — E039 Hypothyroidism, unspecified: Secondary | ICD-10-CM

## 2022-09-28 DIAGNOSIS — R32 Unspecified urinary incontinence: Secondary | ICD-10-CM | POA: Diagnosis not present

## 2022-09-28 DIAGNOSIS — S81801D Unspecified open wound, right lower leg, subsequent encounter: Secondary | ICD-10-CM | POA: Diagnosis not present

## 2022-09-30 ENCOUNTER — Ambulatory Visit (INDEPENDENT_AMBULATORY_CARE_PROVIDER_SITE_OTHER): Payer: Medicare HMO | Admitting: Orthopedic Surgery

## 2022-09-30 ENCOUNTER — Encounter: Payer: Self-pay | Admitting: Orthopedic Surgery

## 2022-09-30 VITALS — BP 108/58 | HR 64 | Temp 96.0°F | Resp 16 | Ht 59.0 in | Wt 119.2 lb

## 2022-09-30 DIAGNOSIS — F419 Anxiety disorder, unspecified: Secondary | ICD-10-CM | POA: Diagnosis not present

## 2022-09-30 DIAGNOSIS — I10 Essential (primary) hypertension: Secondary | ICD-10-CM

## 2022-09-30 DIAGNOSIS — M810 Age-related osteoporosis without current pathological fracture: Secondary | ICD-10-CM | POA: Diagnosis not present

## 2022-09-30 DIAGNOSIS — Z79899 Other long term (current) drug therapy: Secondary | ICD-10-CM | POA: Diagnosis not present

## 2022-09-30 DIAGNOSIS — Z23 Encounter for immunization: Secondary | ICD-10-CM | POA: Diagnosis not present

## 2022-09-30 DIAGNOSIS — R1313 Dysphagia, pharyngeal phase: Secondary | ICD-10-CM | POA: Diagnosis not present

## 2022-09-30 DIAGNOSIS — E039 Hypothyroidism, unspecified: Secondary | ICD-10-CM | POA: Diagnosis not present

## 2022-09-30 DIAGNOSIS — G629 Polyneuropathy, unspecified: Secondary | ICD-10-CM

## 2022-09-30 DIAGNOSIS — K219 Gastro-esophageal reflux disease without esophagitis: Secondary | ICD-10-CM

## 2022-09-30 MED ORDER — LOSARTAN POTASSIUM 50 MG PO TABS: 25.0000 mg | ORAL_TABLET | Freq: Every day | ORAL | Status: DC

## 2022-09-30 NOTE — Progress Notes (Signed)
Careteam: Patient Care Team: [name], NP as PCP - General (Adult Health Nurse Practitioner)  Seen by: Hazle Nordmann, AGNP-C  PLACE OF SERVICE:  [facility]  Advanced Directive information    Allergies  Allergen Reactions   Brimonidine    Childrens Non-Aspirin [Acetaminophen]    Codeine Other (See Comments)   Levofloxacin Other (See Comments)   Lidocaine    Lipitor [Atorvastatin]    Meloxicam    Myrbetriq Theodosia Paling Er]    Oxycodone    Pregabalin    Sulfa Antibiotics Other (See Comments)   Timolol Maleate    Zolpidem    Gabapentin Rash    Pt did not like the way it made her feel    Chief Complaint  Patient presents with   Medical Management of Chronic Issues    6 month follow up.    Health Maintenance    Discuss the need for AWV.   Immunizations    Discuss the need for DTAP, Shingrixm Pne, and Influenza vaccine.      HPI: Patient is a 89 y.o. female seen today for medical management of chronic conditions.   Granddaughter/caregiver present during encounter.   Fall early August. Bruised right hip. She was evaluated by EMS and was not sent to hospital. No pain today. Ambulates short distances with walker and long with wheelchair.   Blood pressures trending SBP< 120 per granddaughter. Plan to reduce losartan to 25 mg daily.   Weight down 5 lbs. She is eating more soft foods due to dysphagia. No recent aspirations, cough or choking episodes.   Controlled substance contract renewed for Xanax, urine drug screen ordered. No recent panic attacks.   Prolia every 6 months, discussed need for bone density.   Flu vaccine given.   Discussed getting prevnar 20 at local pharmacy.     Review of Systems:  Review of Systems  Constitutional:  Negative for malaise/fatigue.  HENT:  Positive for hearing loss. Negative for sore throat.   Eyes:  Negative for blurred vision.  Respiratory:  Negative for cough, shortness of breath and wheezing.   Cardiovascular:  Positive  for leg swelling. Negative for chest pain.  Gastrointestinal:  Negative for abdominal pain and blood in stool.  Genitourinary:  Negative for dysuria and frequency.  Musculoskeletal:  Positive for falls and joint pain.  Neurological:  Positive for weakness. Negative for dizziness and headaches.  Psychiatric/Behavioral:  Positive for depression and memory loss. The patient is nervous/anxious.     Past Medical History:  Diagnosis Date   Anxiety    Arthritis    Phreesia 03/05/2020   Bowel obstruction (HCC) 1950   Bursitis    Cancer of septum of nose (HCC)    melanoma   Constipation    At times   Depression    GERD (gastroesophageal reflux disease)    History of open leg wound    Hypertension    Hypothyroidism    Osteoporosis    Phreesia 03/05/2020   Swallowing difficulty    Thyroid activity decreased    Torn rotator cuff    Past Surgical History:  Procedure Laterality Date   ABDOMINAL HYSTERECTOMY N/A    Phreesia 03/05/2020   APPLICATION OF A-CELL OF EXTREMITY Left 10/17/2019   Procedure: APPLICATION OF INTEGRA BILAYER WOUND MATRIX OF EXTREMITY;  Surgeon: Peggye Form, DO;  Location: [facility];  Service: Plastics;  Laterality: Left;   BIOPSY  12/07/2017   Procedure: BIOPSY;  Surgeon: Lemar Lofty.,  MD;  Location: [facility];  Service: Gastroenterology;;   CHOLECYSTECTOMY     ESOPHAGOGASTRODUODENOSCOPY (EGD) WITH PROPOFOL N/A 12/07/2017   Procedure: ESOPHAGOGASTRODUODENOSCOPY (EGD) WITH PROPOFOL;  Surgeon: Meridee Score Netty Starring., MD;  Location: [facility];  Service: Gastroenterology;  Laterality: N/A;  May need Dilation   HERNIA REPAIR N/A    Phreesia 03/05/2020   hysterecrtomy     I & D EXTREMITY Left 10/17/2019   Procedure: IRRIGATION AND DEBRIDEMENT EXTREMITY;  Surgeon: Peggye Form, DO;  Location: [facility];  Service: Plastics;  Laterality: Left;  45 min   JOINT REPLACEMENT N/A    Phreesia 03/05/2020   left  hip surgery     ROTATOR CUFF REPAIR Right    SMALL INTESTINE SURGERY     Social History:   reports that she has never smoked. She has never used smokeless tobacco. She reports current alcohol use of about 1.0 standard drink of alcohol per week. She reports that she does not use drugs.  Family History  Problem Relation Age of Onset   Uterine cancer Mother    Hypertension Father    Kidney failure Brother    Cancer Son    Cancer Daughter    Colon cancer Neg Hx    Esophageal cancer Neg Hx    Liver disease Neg Hx    Inflammatory bowel disease Neg Hx    Pancreatic cancer Neg Hx    Rectal cancer Neg Hx    Stomach cancer Neg Hx     Medications: Patient's Medications  New Prescriptions   No medications on file  Previous Medications   ACETAMINOPHEN (TYLENOL) 500 MG TABLET    Take 500 mg by mouth as needed.   ALPRAZOLAM (XANAX) 0.25 MG TABLET    TAKE 1 TABLET BY MOUTH TWICE DAILY AS NEEDED FOR ANXIETY   B COMPLEX VITAMINS CAPSULE    Take 1 capsule by mouth daily.   CETIRIZINE (ZYRTEC) 10 MG TABLET    TAKE 1 TABLET BY MOUTH EVERY DAY   CHOLECALCIFEROL (CVS D3) 125 MCG (5000 UT) CAPSULE    Take 5,000 Units by mouth daily.   COENZYME Q10 (COQ10) 100 MG CAPS    Take by mouth.   CONJUGATED ESTROGENS (PREMARIN) VAGINAL CREAM    0.5 mg vaginally daily x 2 weeks then twice weekly for maintenance   CYANOCOBALAMIN 1000 MCG/ML LIQD    Take 1 tablet by mouth daily in the afternoon.   DOCUSATE SODIUM (COLACE) 100 MG CAPSULE    Take 100 mg by mouth as needed.   DULOXETINE (CYMBALTA) 60 MG CAPSULE    TAKE 1 CAPSULE BY MOUTH 2 TIMES DAILY   HYDROCORTISONE CREAM 0.5 %    Apply 1 Application topically as needed.   LACTOBACILLUS (PROBIOTIC ACIDOPHILUS PO)    Take 1 capsule by mouth daily.   LEVOTHYROXINE (SYNTHROID) 75 MCG TABLET    TAKE 1 TABLET BY MOUTH EVERY DAY   LOPERAMIDE (IMODIUM) 2 MG CAPSULE    Take by mouth as needed for diarrhea or loose stools.   LOSARTAN (COZAAR) 50 MG TABLET    TAKE 1  TABLET BY MOUTH EVERY DAY   MAGNESIUM GLUCONATE 27.5 MG TABS    Take 500 mg by mouth as directed. With electrolytes drink   MENTHOL, TOPICAL ANALGESIC, 154 MG PADS    Place 1 each onto the skin daily as needed (for hip pain.).   NUTRITIONAL SUPPLEMENTS (FEEDING SUPPLEMENT, PEDIASURE 1.5,) LIQD LIQUID    Take 237 mLs by mouth  2 (two) times daily between meals.   PANTOPRAZOLE (PROTONIX) 40 MG TABLET    TAKE 1 TABLET BY MOUTH 2 TIMES DAILY   PROBIOTIC PRODUCT (DAILY PROBIOTIC PO)    Take by mouth.   PROLIA 60 MG/ML SOSY INJECTION    Take 60 mg by mouth every 6 (six) months.   SIMETHICONE (GAS RELIEF PO)    Take by mouth.   TROLAMINE SALICYLATE (ASPERCREME) 10 % CREAM    Apply 1 application topically as needed for muscle pain.   WHITE PETROLATUM (VASELINE) GEL    Apply 1 Application topically as needed for lip care.  Modified Medications   No medications on file  Discontinued Medications   No medications on file    Physical Exam:  Vitals:   09/30/22 1342  BP: (!) 108/58  Pulse: 64  Resp: 16  Temp: (!) 96 F (35.6 C)  SpO2: 97%  Weight: 119 lb 3.2 oz (54.1 kg)  Height: 4\' 11"  (1.499 m)   Body mass index is 24.08 kg/m. Wt Readings from Last 3 Encounters:  09/30/22 119 lb 3.2 oz (54.1 kg)  07/01/22 124 lb 3.2 oz (56.3 kg)  03/18/22 119 lb (54 kg)    Physical Exam Vitals reviewed.  Constitutional:      General: She is not in acute distress. HENT:     Head: Normocephalic.  Eyes:     General:        Right eye: No discharge.        Left eye: No discharge.  Cardiovascular:     Rate and Rhythm: Normal rate and regular rhythm.     Pulses: Normal pulses.     Heart sounds: Normal heart sounds.  Pulmonary:     Effort: Pulmonary effort is normal. No respiratory distress.     Breath sounds: Normal breath sounds. No wheezing.  Abdominal:     General: Bowel sounds are normal.     Palpations: Abdomen is soft.  Musculoskeletal:     Cervical back: Neck supple.     Right lower leg:  Edema present.     Left lower leg: Edema present.     Comments: Non pitting  Skin:    General: Skin is warm.     Capillary Refill: Capillary refill takes less than 2 seconds.     Comments: Thin and fragile skin  Neurological:     General: No focal deficit present.     Mental Status: She is alert. Mental status is at baseline.     Motor: Weakness present.     Gait: Gait abnormal.  Psychiatric:        Mood and Affect: Mood normal.     Labs reviewed: Basic Metabolic Panel: Recent Labs    03/18/22 1347 07/15/22 0000  NA 133* 137  K 4.6 4.9  CL 96* 99  CO2 30 26*  GLUCOSE 76  --   BUN 20 17  CREATININE 0.67 0.7  CALCIUM 9.1 9.4  TSH 3.83  --    Liver Function Tests: Recent Labs    03/18/22 1347 07/15/22 0000  AST 17 17  ALT 9 8  ALKPHOS  --  50  BILITOT 0.4  --   PROT 5.8*  --   ALBUMIN  --  4.0   No results for input(s): "LIPASE", "AMYLASE" in the last 8760 hours. No results for input(s): "AMMONIA" in the last 8760 hours. CBC: Recent Labs    03/18/22 1347 07/15/22 0000  WBC 5.9 4.0  NEUTROABS 3,711  2.30  HGB 11.6* 9.5*  HCT 34.0* 30*  MCV 103.0*  --   PLT 156 171   Lipid Panel: No results for input(s): "CHOL", "HDL", "LDLCALC", "TRIG", "CHOLHDL", "LDLDIRECT" in the last 8760 hours. TSH: Recent Labs    03/18/22 1347  TSH 3.83   A1C: No results found for: "HGBA1C"   Assessment/Plan 1. Essential hypertension - low, SBP< 120 - hgb was 9.5 07/15/2022 - plan to reduce losartan to 25 mg po daily - advised to check blood pressures BID x 1 week and report to PCP - losartan (COZAAR) 50 MG tablet; Take 0.5 tablets (25 mg total) by mouth daily. - Complete Metabolic Panel with eGFR - CBC with Differential/Platelet  2. Acquired hypothyroidism - TSH 3.83 03/18/2022 - cont levothyroxine - TSH  3. Polyneuropathy - ongoing - cont Cymbalta  4. Anxiety - no recent panic attacks - controlled substance contract signed today - cont xanax - DRUG  MONITORING, PANEL 8 WITH CONFIRMATION, URINE  5. Gastroesophageal reflux disease without esophagitis - hgb 9.5 07/15/2022 - cont pantoprazole  6. Pharyngeal dysphagia - no recent aspirations - swallow study 2019 - now taking meds with pudding/apple sauce - cont soft diet  7. Age related osteoporosis, unspecified pathological fracture presence - on Prolia and Vitamin D - DG BONE DENSITY (DXA); Future  8. Need for influenza vaccination - Flu Vaccine Trivalent High Dose (Fluad)  Total time: 38 minutes. Greater than 50% of total time spent doing patient education regarding health maintenance, HTN, anxiety, dysphagia, and osteoporosis.     Next appt: Visit date not found  Patsie Mccardle Scherry Ran  [facility] [phone]

## 2022-09-30 NOTE — Patient Instructions (Addendum)
Please get Prevnar 20 (pneumonia vaccine) at [facility] next week  [facility] fax# [phone]> send order for Ensure and pull up   Bone density scheduled with [facility]> call [phone]

## 2022-10-02 LAB — CBC WITH DIFFERENTIAL/PLATELET
Absolute Monocytes: 717 {cells}/uL (ref 200–950)
Basophils Absolute: 101 {cells}/uL (ref 0–200)
Basophils Relative: 1.8 %
Eosinophils Absolute: 78 {cells}/uL (ref 15–500)
Eosinophils Relative: 1.4 %
HCT: 38.2 % (ref 35.0–45.0)
Hemoglobin: 12.8 g/dL (ref 11.7–15.5)
Lymphs Abs: 1238 {cells}/uL (ref 850–3900)
MCH: 34.1 pg — ABNORMAL HIGH (ref 27.0–33.0)
MCHC: 33.5 g/dL (ref 32.0–36.0)
MCV: 101.9 fL — ABNORMAL HIGH (ref 80.0–100.0)
MPV: 10.4 fL (ref 7.5–12.5)
Monocytes Relative: 12.8 %
Neutro Abs: 3466 {cells}/uL (ref 1500–7800)
Neutrophils Relative %: 61.9 %
Platelets: 194 10*3/uL (ref 140–400)
RBC: 3.75 10*6/uL — ABNORMAL LOW (ref 3.80–5.10)
RDW: 14.4 % (ref 11.0–15.0)
Total Lymphocyte: 22.1 %
WBC: 5.6 10*3/uL (ref 3.8–10.8)

## 2022-10-02 LAB — DRUG MONITORING, PANEL 8 WITH CONFIRMATION, URINE
6 Acetylmorphine: NEGATIVE ng/mL (ref <10)
Alcohol Metabolites: NEGATIVE ng/mL (ref <500)
Alphahydroxyalprazolam: 277 ng/mL — ABNORMAL HIGH (ref <25)
Alphahydroxymidazolam: NEGATIVE ng/mL (ref <50)
Alphahydroxytriazolam: NEGATIVE ng/mL (ref <50)
Aminoclonazepam: NEGATIVE ng/mL (ref <25)
Amphetamines: NEGATIVE ng/mL (ref <500)
Benzodiazepines: POSITIVE ng/mL — AB (ref <100)
Buprenorphine, Urine: NEGATIVE ng/mL (ref <5)
Cocaine Metabolite: NEGATIVE ng/mL (ref <150)
Creatinine: 115.3 mg/dL (ref >=20.0)
Hydroxyethylflurazepam: NEGATIVE ng/mL (ref <50)
Lorazepam: NEGATIVE ng/mL (ref <50)
MDMA: NEGATIVE ng/mL (ref <500)
Marijuana Metabolite: NEGATIVE ng/mL (ref <20)
Nordiazepam: NEGATIVE ng/mL (ref <50)
Opiates: NEGATIVE ng/mL (ref <100)
Oxazepam: NEGATIVE ng/mL (ref <50)
Oxidant: NEGATIVE ug/mL (ref <200)
Oxycodone: NEGATIVE ng/mL (ref <100)
Temazepam: NEGATIVE ng/mL (ref <50)
pH: 5.6 (ref 4.5–9.0)

## 2022-10-02 LAB — COMPLETE METABOLIC PANEL WITH GFR
AG Ratio: 2.1 (calc) (ref 1.0–2.5)
ALT: 8 U/L (ref 6–29)
AST: 20 U/L (ref 10–35)
Albumin: 4.2 g/dL (ref 3.6–5.1)
Alkaline phosphatase (APISO): 55 U/L (ref 37–153)
BUN: 16 mg/dL (ref 7–25)
CO2: 30 mmol/L (ref 20–32)
Calcium: 9.1 mg/dL (ref 8.6–10.4)
Chloride: 94 mmol/L — ABNORMAL LOW (ref 98–110)
Creat: 0.7 mg/dL (ref 0.60–0.95)
Globulin: 2 g/dL (ref 1.9–3.7)
Glucose, Bld: 80 mg/dL (ref 65–99)
Potassium: 4.1 mmol/L (ref 3.5–5.3)
Sodium: 132 mmol/L — ABNORMAL LOW (ref 135–146)
Total Bilirubin: 0.6 mg/dL (ref 0.2–1.2)
Total Protein: 6.2 g/dL (ref 6.1–8.1)
eGFR: 79 mL/min/{1.73_m2} (ref >=60)

## 2022-10-02 LAB — DM TEMPLATE

## 2022-10-02 LAB — TSH: TSH: 1.2 m[IU]/L (ref 0.40–4.50)

## 2022-10-04 ENCOUNTER — Other Ambulatory Visit: Payer: Self-pay | Admitting: Orthopedic Surgery

## 2022-10-04 DIAGNOSIS — F419 Anxiety disorder, unspecified: Secondary | ICD-10-CM

## 2022-10-04 NOTE — Telephone Encounter (Signed)
Patient has request refill on medication Xanax 0.25mg . Patient last refill dated 03/25/2022. Patient has no contract on file. Medication pend and sent to PCP [name], NP

## 2022-10-11 ENCOUNTER — Encounter: Payer: Self-pay | Admitting: Orthopedic Surgery

## 2022-10-14 ENCOUNTER — Encounter: Payer: Self-pay | Admitting: Orthopedic Surgery

## 2022-11-02 ENCOUNTER — Telehealth: Payer: Self-pay

## 2022-11-02 NOTE — Telephone Encounter (Signed)
Kayla Lloyd w/ health department called to see if patients incontinence supply order could be faxed to home care delivered @[phone] due to [employer] not able to supply at the moment.

## 2022-11-03 ENCOUNTER — Ambulatory Visit: Payer: Medicare HMO | Admitting: Podiatry

## 2022-11-24 ENCOUNTER — Telehealth: Payer: Self-pay

## 2022-11-24 NOTE — Telephone Encounter (Signed)
Message left on clinical intake voicemail:   Kayla Lloyd with [employer] called to confirm that we received a fax sent requesting DME supplies.  I returned call to see when fax was sent, per representative it was sent on 11/18/22. Form is in Amy's review and sign folder for review and then to the administrative staff for further processing.

## 2022-11-26 DIAGNOSIS — N3281 Overactive bladder: Secondary | ICD-10-CM | POA: Diagnosis not present

## 2022-11-26 DIAGNOSIS — E039 Hypothyroidism, unspecified: Secondary | ICD-10-CM | POA: Diagnosis not present

## 2022-11-26 DIAGNOSIS — I10 Essential (primary) hypertension: Secondary | ICD-10-CM | POA: Diagnosis not present

## 2022-11-26 DIAGNOSIS — R32 Unspecified urinary incontinence: Secondary | ICD-10-CM | POA: Diagnosis not present

## 2022-12-13 ENCOUNTER — Encounter: Payer: Self-pay | Admitting: Podiatry

## 2022-12-13 ENCOUNTER — Ambulatory Visit (INDEPENDENT_AMBULATORY_CARE_PROVIDER_SITE_OTHER): Payer: Medicare HMO | Admitting: Podiatry

## 2022-12-13 DIAGNOSIS — M79675 Pain in left toe(s): Secondary | ICD-10-CM

## 2022-12-13 DIAGNOSIS — B351 Tinea unguium: Secondary | ICD-10-CM

## 2022-12-13 DIAGNOSIS — M79674 Pain in right toe(s): Secondary | ICD-10-CM

## 2022-12-13 DIAGNOSIS — G629 Polyneuropathy, unspecified: Secondary | ICD-10-CM | POA: Diagnosis not present

## 2022-12-13 NOTE — Progress Notes (Signed)
  Subjective:  Patient ID: Kayla Lloyd, female    DOB: 27-Feb-1923,   MRN: [id number]  No chief complaint on file.   89 y.o. female presents for concern of thickened elongated and painful nails that are difficult to trim. Requesting to have them trimmed today. Relates burning and tingling in their feet. Patient is not diabetic but does have neuropathy  PCP:  [name], NP    . Denies any other pedal complaints. Denies n/v/f/c.   Past Medical History:  Diagnosis Date   Anxiety    Arthritis    Phreesia 03/05/2020   Bowel obstruction (HCC) 1950   Bursitis    Cancer of septum of nose (HCC)    melanoma   Constipation    At times   Depression    GERD (gastroesophageal reflux disease)    History of open leg wound    Hypertension    Hypothyroidism    Osteoporosis    Phreesia 03/05/2020   Swallowing difficulty    Thyroid activity decreased    Torn rotator cuff     Objective:  Physical Exam: Vascular: DP/PT pulses 2/4 bilateral. CFT <3 seconds. Absent hair growth on digits. Edema noted to bilateral lower extremities. Xerosis noted bilaterally.  Skin. No lacerations or abrasions bilateral feet. Nails 1-5 bilateral  are thickened discolored and elongated with subungual debris.  Musculoskeletal: MMT 5/5 bilateral lower extremities in DF, PF, Inversion and Eversion. Deceased ROM in DF of ankle joint.  Neurological: Sensation intact to light touch. Protective sensation diminished bilateral.    Assessment:   1. Pain due to onychomycosis of toenails of both feet   2. Peripheral polyneuropathy        Plan:  Patient was evaluated and treated and all questions answered. -Discussed and educated patient onfoot care, especially with  regards to the vascular, neurological and musculoskeletal systems.  -Discussed supportive shoes at all times and checking feet regularly.  -Mechanically debrided all nails 1-5 bilateral using sterile nail nipper and filed with dremel  without incident  -Answered all patient questions -Patient to return  in 3 months for at risk foot care -Patient advised to call the office if any problems or questions arise in the meantime.   Louann Sjogren, DPM

## 2022-12-22 ENCOUNTER — Ambulatory Visit: Payer: Medicare HMO | Admitting: Podiatry

## 2022-12-26 DIAGNOSIS — R32 Unspecified urinary incontinence: Secondary | ICD-10-CM | POA: Diagnosis not present

## 2022-12-26 DIAGNOSIS — N3281 Overactive bladder: Secondary | ICD-10-CM | POA: Diagnosis not present

## 2022-12-29 DIAGNOSIS — R399 Unspecified symptoms and signs involving the genitourinary system: Secondary | ICD-10-CM | POA: Diagnosis not present

## 2023-01-14 DIAGNOSIS — R3981 Functional urinary incontinence: Secondary | ICD-10-CM | POA: Diagnosis not present

## 2023-01-14 DIAGNOSIS — N319 Neuromuscular dysfunction of bladder, unspecified: Secondary | ICD-10-CM | POA: Diagnosis not present

## 2023-01-14 DIAGNOSIS — N8110 Cystocele, unspecified: Secondary | ICD-10-CM | POA: Diagnosis not present

## 2023-01-17 DIAGNOSIS — M25511 Pain in right shoulder: Secondary | ICD-10-CM | POA: Diagnosis not present

## 2023-01-17 DIAGNOSIS — M810 Age-related osteoporosis without current pathological fracture: Secondary | ICD-10-CM | POA: Diagnosis not present

## 2023-01-17 DIAGNOSIS — M25552 Pain in left hip: Secondary | ICD-10-CM | POA: Diagnosis not present

## 2023-01-17 DIAGNOSIS — Z79899 Other long term (current) drug therapy: Secondary | ICD-10-CM | POA: Diagnosis not present

## 2023-01-17 DIAGNOSIS — M1990 Unspecified osteoarthritis, unspecified site: Secondary | ICD-10-CM | POA: Diagnosis not present

## 2023-01-17 DIAGNOSIS — M2550 Pain in unspecified joint: Secondary | ICD-10-CM | POA: Diagnosis not present

## 2023-01-17 DIAGNOSIS — M25551 Pain in right hip: Secondary | ICD-10-CM | POA: Diagnosis not present

## 2023-01-17 DIAGNOSIS — M7062 Trochanteric bursitis, left hip: Secondary | ICD-10-CM | POA: Diagnosis not present

## 2023-01-17 DIAGNOSIS — M25512 Pain in left shoulder: Secondary | ICD-10-CM | POA: Diagnosis not present

## 2023-01-26 DIAGNOSIS — N3281 Overactive bladder: Secondary | ICD-10-CM | POA: Diagnosis not present

## 2023-01-26 DIAGNOSIS — R32 Unspecified urinary incontinence: Secondary | ICD-10-CM | POA: Diagnosis not present

## 2023-02-17 DIAGNOSIS — L989 Disorder of the skin and subcutaneous tissue, unspecified: Secondary | ICD-10-CM | POA: Diagnosis not present

## 2023-02-17 DIAGNOSIS — L299 Pruritus, unspecified: Secondary | ICD-10-CM | POA: Diagnosis not present

## 2023-02-18 DIAGNOSIS — N3000 Acute cystitis without hematuria: Secondary | ICD-10-CM | POA: Diagnosis not present

## 2023-02-26 DIAGNOSIS — R32 Unspecified urinary incontinence: Secondary | ICD-10-CM | POA: Diagnosis not present

## 2023-02-26 DIAGNOSIS — N3281 Overactive bladder: Secondary | ICD-10-CM | POA: Diagnosis not present

## 2023-02-28 ENCOUNTER — Encounter: Payer: Self-pay | Admitting: Podiatry

## 2023-03-09 ENCOUNTER — Other Ambulatory Visit: Payer: Self-pay | Admitting: Orthopedic Surgery

## 2023-03-09 DIAGNOSIS — E039 Hypothyroidism, unspecified: Secondary | ICD-10-CM

## 2023-03-09 DIAGNOSIS — K219 Gastro-esophageal reflux disease without esophagitis: Secondary | ICD-10-CM

## 2023-03-10 ENCOUNTER — Other Ambulatory Visit: Payer: Self-pay | Admitting: Orthopedic Surgery

## 2023-03-10 DIAGNOSIS — F419 Anxiety disorder, unspecified: Secondary | ICD-10-CM

## 2023-03-16 ENCOUNTER — Ambulatory Visit: Payer: Medicare HMO | Admitting: Podiatry

## 2023-03-26 DIAGNOSIS — N3281 Overactive bladder: Secondary | ICD-10-CM | POA: Diagnosis not present

## 2023-03-26 DIAGNOSIS — R32 Unspecified urinary incontinence: Secondary | ICD-10-CM | POA: Diagnosis not present

## 2023-03-28 ENCOUNTER — Ambulatory Visit (INDEPENDENT_AMBULATORY_CARE_PROVIDER_SITE_OTHER): Payer: Medicare HMO | Admitting: Podiatry

## 2023-03-28 VITALS — Ht 59.0 in | Wt 119.0 lb

## 2023-03-28 DIAGNOSIS — G629 Polyneuropathy, unspecified: Secondary | ICD-10-CM

## 2023-03-28 DIAGNOSIS — M79674 Pain in right toe(s): Secondary | ICD-10-CM | POA: Diagnosis not present

## 2023-03-28 DIAGNOSIS — M79675 Pain in left toe(s): Secondary | ICD-10-CM | POA: Diagnosis not present

## 2023-03-28 DIAGNOSIS — B351 Tinea unguium: Secondary | ICD-10-CM

## 2023-03-28 NOTE — Progress Notes (Signed)
  Subjective:  Patient ID: Kayla Lloyd, female    DOB: 04-May-1923,   MRN: [id number]  Chief Complaint  Patient presents with   RFC    Nail trim .    89 y.o. female presents for concern of thickened elongated and painful nails that are difficult to trim. Requesting to have them trimmed today. Relates burning and tingling in their feet. Patient is not diabetic but does have neuropathy  PCP:  [name], NP    . Denies any other pedal complaints. Denies n/v/f/c.   Past Medical History:  Diagnosis Date   Anxiety    Arthritis    Phreesia 03/05/2020   Bowel obstruction (HCC) 1950   Bursitis    Cancer of septum of nose (HCC)    melanoma   Constipation    At times   Depression    GERD (gastroesophageal reflux disease)    History of open leg wound    Hypertension    Hypothyroidism    Osteoporosis    Phreesia 03/05/2020   Swallowing difficulty    Thyroid activity decreased    Torn rotator cuff     Objective:  Physical Exam: Vascular: DP/PT pulses 2/4 bilateral. CFT <3 seconds. Absent hair growth on digits. Edema noted to bilateral lower extremities. Xerosis noted bilaterally.  Skin. No lacerations or abrasions bilateral feet. Nails 1-5 bilateral  are thickened discolored and elongated with subungual debris.  Musculoskeletal: MMT 5/5 bilateral lower extremities in DF, PF, Inversion and Eversion. Deceased ROM in DF of ankle joint.  Neurological: Sensation intact to light touch. Protective sensation diminished bilateral.    Assessment:   1. Pain due to onychomycosis of toenails of both feet   2. Peripheral polyneuropathy        Plan:  Patient was evaluated and treated and all questions answered. -Discussed and educated patient onfoot care, especially with  regards to the vascular, neurological and musculoskeletal systems.  -Discussed supportive shoes at all times and checking feet regularly.  -Mechanically debrided all nails 1-5 bilateral using sterile  nail nipper and filed with dremel without incident  -Answered all patient questions -Patient to return  in 3 months for at risk foot care -Patient advised to call the office if any problems or questions arise in the meantime.   Louann Sjogren, DPM

## 2023-03-31 ENCOUNTER — Encounter: Payer: Self-pay | Admitting: Orthopedic Surgery

## 2023-03-31 ENCOUNTER — Ambulatory Visit (INDEPENDENT_AMBULATORY_CARE_PROVIDER_SITE_OTHER): Payer: Medicare HMO | Admitting: Orthopedic Surgery

## 2023-03-31 VITALS — BP 114/82 | HR 87 | Temp 96.1°F | Resp 16 | Ht 59.0 in | Wt 111.4 lb

## 2023-03-31 DIAGNOSIS — H6123 Impacted cerumen, bilateral: Secondary | ICD-10-CM | POA: Diagnosis not present

## 2023-03-31 DIAGNOSIS — R197 Diarrhea, unspecified: Secondary | ICD-10-CM | POA: Diagnosis not present

## 2023-03-31 DIAGNOSIS — R634 Abnormal weight loss: Secondary | ICD-10-CM | POA: Diagnosis not present

## 2023-03-31 DIAGNOSIS — E039 Hypothyroidism, unspecified: Secondary | ICD-10-CM | POA: Diagnosis not present

## 2023-03-31 DIAGNOSIS — G629 Polyneuropathy, unspecified: Secondary | ICD-10-CM

## 2023-03-31 DIAGNOSIS — I10 Essential (primary) hypertension: Secondary | ICD-10-CM

## 2023-03-31 DIAGNOSIS — M810 Age-related osteoporosis without current pathological fracture: Secondary | ICD-10-CM

## 2023-03-31 DIAGNOSIS — K219 Gastro-esophageal reflux disease without esophagitis: Secondary | ICD-10-CM | POA: Diagnosis not present

## 2023-03-31 MED ORDER — DULOXETINE HCL 60 MG PO CPEP: 60.0000 mg | ORAL_CAPSULE | Freq: Two times a day (BID) | ORAL | 3 refills | Status: DC

## 2023-03-31 MED ORDER — LOSARTAN POTASSIUM 25 MG PO TABS: 25.0000 mg | ORAL_TABLET | Freq: Every day | ORAL | 2 refills | Status: AC

## 2023-03-31 NOTE — Patient Instructions (Addendum)
 Try carbamide peroxide 6.5% ( AKA Debrox) 5 drops to both ears every night x 5 days> flush ears in shower after 5 days  Try fiber capsules (psyllium) 1-2 times weekly to bulk up stool  No more pepto bismal

## 2023-03-31 NOTE — Progress Notes (Signed)
 Careteam: Patient Care Team: [name], NP as PCP - General (Adult Health Nurse Practitioner)  Seen by: Hazle Nordmann, AGNP-C  PLACE OF SERVICE:  [facility]  Advanced Directive information Does Patient Have a Medical Advance Directive?: Yes, Type of Advance Directive: Healthcare Power of [place];Living will;Out of facility DNR (pink MOST or yellow form), Does patient want to make changes to medical advance directive?: No - Patient declined  Allergies  Allergen Reactions   Brimonidine    Codeine Other (See Comments)   Levofloxacin Other (See Comments)   Lipitor [Atorvastatin]    Meloxicam    Myrbetriq Theodosia Paling Er]    Oxycodone    Pregabalin    Sulfa Antibiotics Other (See Comments)   Timolol Maleate    Zolpidem    Gabapentin Rash    Pt did not like the way it made her feel    Chief Complaint  Patient presents with   Medical Management of Chronic Issues    6 month follow up.    Immunizations    Discuss the need for Pne vaccine, and Shingrix vaccine. NCIR Verified.    Health Maintenance    Discuss the need for AWV.     HPI: Patient is a 89 y.o. female seen today foe medical management of chronic conditions.   Granddaughter present during encounter.   Followed by Dr. Deanne Coffer for osteoporosis. Bone density recently done, indicating worsening osteoporosis. She is currently taking Prolia. Foreto daily injections recommended. Granddaughter/caregiver unsure if necessary at 89 years old. Advised to discuss goals of care with provider.   No recent falls. Ambulates long distances with w/c.   01/24 treated for UTI, culture > 60,000-100,000 klebsiella pneumoniae and E.coli. She completed antibiotics. No urinary symptoms. She has had intermittent diarrhea the last few weeks. She stopped taking supplement shake because she thought it was cause. Also been taking pepto without relief. She is eating 2 meals daily, small amounts. Weight is down 8 labs from last routine visit.    She is more HOH today. Followed by audiology. Uses bilateral hearing aids.   Blood pressure stable with reduced losartan.     Review of Systems:  Review of Systems  Unable to perform ROS: Age [facility])    Past Medical History:  Diagnosis Date   Anxiety    Arthritis    Phreesia 03/05/2020   Bowel obstruction (HCC) 1950   Bursitis    Cancer of septum of nose (HCC)    melanoma   Constipation    At times   Depression    GERD (gastroesophageal reflux disease)    History of open leg wound    Hypertension    Hypothyroidism    Osteoporosis    Phreesia 03/05/2020   Swallowing difficulty    Thyroid activity decreased    Torn rotator cuff    Past Surgical History:  Procedure Laterality Date   ABDOMINAL HYSTERECTOMY N/A    Phreesia 03/05/2020   APPLICATION OF A-CELL OF EXTREMITY Left 10/17/2019   Procedure: APPLICATION OF INTEGRA BILAYER WOUND MATRIX OF EXTREMITY;  Surgeon: Peggye Form, DO;  Location:  SURGERY CENTER;  Service: Plastics;  Laterality: Left;   BIOPSY  12/07/2017   Procedure: BIOPSY;  Surgeon: Meridee Score Netty Starring., MD;  Location: [facility];  Service: Gastroenterology;;   CHOLECYSTECTOMY     ESOPHAGOGASTRODUODENOSCOPY (EGD) WITH PROPOFOL N/A 12/07/2017   Procedure: ESOPHAGOGASTRODUODENOSCOPY (EGD) WITH PROPOFOL;  Surgeon: Lemar Lofty., MD;  Location: [facility];  Service: Gastroenterology;  Laterality:  N/A;  May need Dilation   HERNIA REPAIR N/A    Phreesia 03/05/2020   hysterecrtomy     I & D EXTREMITY Left 10/17/2019   Procedure: IRRIGATION AND DEBRIDEMENT EXTREMITY;  Surgeon: Peggye Form, DO;  Location: [facility];  Service: Plastics;  Laterality: Left;  45 min   JOINT REPLACEMENT N/A    Phreesia 03/05/2020   left hip surgery     ROTATOR CUFF REPAIR Right    SMALL INTESTINE SURGERY     Social History:   reports that she has never smoked. She has never used smokeless tobacco. She reports current  alcohol use of about 1.0 standard drink of alcohol per week. She reports that she does not use drugs.  Family History  Problem Relation Age of Onset   Uterine cancer Mother    Hypertension Father    Kidney failure Brother    Cancer Son    Cancer Daughter    Colon cancer Neg Hx    Esophageal cancer Neg Hx    Liver disease Neg Hx    Inflammatory bowel disease Neg Hx    Pancreatic cancer Neg Hx    Rectal cancer Neg Hx    Stomach cancer Neg Hx     Medications: Patient's Medications  New Prescriptions   No medications on file  Previous Medications   ACETAMINOPHEN (TYLENOL) 500 MG TABLET    Take 500 mg by mouth as needed.   ALPRAZOLAM (XANAX) 0.25 MG TABLET    TAKE 1 TABLET BY MOUTH TWICE DAILY AS NEEDED FOR ANXIETY   B COMPLEX VITAMINS CAPSULE    Take 1 capsule by mouth daily.   CETIRIZINE (ZYRTEC) 10 MG TABLET    TAKE 1 TABLET BY MOUTH EVERY DAY   CHOLECALCIFEROL ([employer]S D3) 125 MCG (5000 UT) CAPSULE    Take 5,000 Units by mouth daily.   COENZYME Q10 (COQ10) 100 MG CAPS    Take by mouth.   CYANOCOBALAMIN 1000 MCG/ML LIQD    Take 1 tablet by mouth daily in the afternoon.   DULOXETINE (CYMBALTA) 60 MG CAPSULE    TAKE 1 CAPSULE BY MOUTH 2 TIMES DAILY   HYDROCORTISONE CREAM 0.5 %    Apply 1 Application topically as needed.   LEVOTHYROXINE (SYNTHROID) 75 MCG TABLET    TAKE 1 TABLET BY MOUTH ONCE DAILY   LOSARTAN (COZAAR) 50 MG TABLET    Take 0.5 tablets (25 mg total) by mouth daily.   MENTHOL, TOPICAL ANALGESIC, 154 MG PADS    Place 1 each onto the skin daily as needed (for hip pain.).   NUTRITIONAL SUPPLEMENTS (FEEDING SUPPLEMENT, PEDIASURE 1.5,) LIQD LIQUID    Take 237 mLs by mouth 2 (two) times daily between meals.   PANTOPRAZOLE (PROTONIX) 40 MG TABLET    TAKE 1 TABLET BY MOUTH 2 TIMES DAILY   PROBIOTIC PRODUCT (DAILY PROBIOTIC PO)    Take by mouth.   PROLIA 60 MG/ML SOSY INJECTION    Take 60 mg by mouth every 6 (six) months.   SIMETHICONE (GAS RELIEF PO)    Take by mouth.   TROLAMINE  SALICYLATE (ASPERCREME) 10 % CREAM    Apply 1 application topically as needed for muscle pain.   WHITE PETROLATUM (VASELINE) GEL    Apply 1 Application topically as needed for lip care.  Modified Medications   No medications on file  Discontinued Medications   LACTOBACILLUS (PROBIOTIC ACIDOPHILUS PO)    Take 1 capsule by mouth daily.  Physical Exam:  Vitals:   03/31/23 1332  BP: 114/82  Pulse: 87  Resp: 16  Temp: (!) 96.1 F (35.6 C)  SpO2: 93%  Weight: 111 lb 6.4 oz (50.5 kg)  Height: 4\' 11"  (1.499 m)   Body mass index is 22.5 kg/m. Wt Readings from Last 3 Encounters:  03/31/23 111 lb 6.4 oz (50.5 kg)  03/28/23 119 lb (54 kg)  09/30/22 119 lb 3.2 oz (54.1 kg)    Physical Exam Vitals reviewed.  Constitutional:      General: She is not in acute distress. HENT:     Head: Normocephalic.     Right Ear: There is impacted cerumen.     Left Ear: There is impacted cerumen.     Ears:     Comments: Bilateral hearing aids    Nose: Nose normal.     Mouth/Throat:     Mouth: Mucous membranes are moist.  Eyes:     General:        Right eye: No discharge.        Left eye: No discharge.  Cardiovascular:     Rate and Rhythm: Normal rate and regular rhythm.     Pulses: Normal pulses.     Heart sounds: Murmur heard.  Pulmonary:     Effort: Pulmonary effort is normal.     Breath sounds: Normal breath sounds.  Abdominal:     General: Bowel sounds are normal.     Palpations: Abdomen is soft.  Musculoskeletal:     Cervical back: Neck supple.     Right lower leg: No edema.     Left lower leg: No edema.  Skin:    General: Skin is warm.     Capillary Refill: Capillary refill takes less than 2 seconds.     Comments: Fragile skin   Neurological:     General: No focal deficit present.     Mental Status: She is alert. Mental status is at baseline.     Motor: Weakness present.     Gait: Gait abnormal.     Comments: wheelchair  Psychiatric:        Mood and Affect: Mood normal.      Labs reviewed: Basic Metabolic Panel: Recent Labs    07/15/22 0000 09/30/22 1424  NA 137 132*  K 4.9 4.1  CL 99 94*  CO2 26* 30  GLUCOSE  --  80  BUN 17 16  CREATININE 0.7 0.70  CALCIUM 9.4 9.1  TSH  --  1.20   Liver Function Tests: Recent Labs    07/15/22 0000 09/30/22 1424  AST 17 20  ALT 8 8  ALKPHOS 50  --   BILITOT  --  0.6  PROT  --  6.2  ALBUMIN 4.0  --    No results for input(s): "LIPASE", "AMYLASE" in the last 8760 hours. No results for input(s): "AMMONIA" in the last 8760 hours. CBC: Recent Labs    07/15/22 0000 09/30/22 1424  WBC 4.0 5.6  NEUTROABS 2.30 3,466  HGB 9.5* 12.8  HCT 30* 38.2  MCV  --  101.9*  PLT 171 194   Lipid Panel: No results for input(s): "CHOL", "HDL", "LDLCALC", "TRIG", "CHOLHDL", "LDLDIRECT" in the last 8760 hours. TSH: Recent Labs    09/30/22 1424  TSH 1.20   A1C: No results found for: "HGBA1C"   Assessment/Plan 1. Essential hypertension (Primary) - controlled - BUN/creat 14/0.62 03/31/2022 - losartan reduced 09/2022 - cont losartan - CBC with Differential/Platelet - Complete  Metabolic Panel with eGFR - losartan (COZAAR) 25 MG tablet; Take 1 tablet (25 mg total) by mouth daily.  Dispense: 90 tablet; Refill: 2  2. Gastroesophageal reflux disease without esophagitis - hgb stable> 12.4 03/31/2023 - cont pantoprazole  3. Acquired hypothyroidism - TSH stable> 0.88 03/31/2023 - cont levothyroxine - TSH  4. Age related osteoporosis, unspecified pathological fracture presence - followed by rheumatology - recent DEXA indicated worsening osteoporosis> Forteo recommended - caregiver does not want daily injections for her - advised to discuss goals of care with provider - cont Prolia at this time  5. Weight loss - weight down 8 lbs - suspect from holding supplement shakes due to diarrhea versus advanced age - advised to continue supplement shakes - if weights continue to decline recommend hospice  6.  Polyneuropathy - DULoxetine (CYMBALTA) 60 MG capsule; Take 1 capsule (60 mg total) by mouth 2 (two) times daily.  Dispense: 180 capsule; Refill: 3  7. Bilateral impacted cerumen - increased hearing loss with hearing aids - start debrox 5 gtts to both ears every night x 5 days - flush ears in shower when Debrox complete  8. Diarrhea, unspecified - began with recent antibiotic use for UTI - exam unremarkable - stop pepto bismol - start psyllium capsules> 2x/week  - discussed yougurt/probiotic  Total time: 32 minutes. Greater than 50% of total time spent doing patient education regarding health maintenance, HTN, osteoporosis, diarrhea, cerumen impaction and weight loss including symptom/medication management.   Next appt: Visit date not found  Kayla Lloyd  [facility] [phone]

## 2023-04-01 ENCOUNTER — Encounter: Payer: Self-pay | Admitting: Orthopedic Surgery

## 2023-04-01 LAB — CBC WITH DIFFERENTIAL/PLATELET
Absolute Lymphocytes: 1064 {cells}/uL (ref 850–3900)
Absolute Monocytes: 683 {cells}/uL (ref 200–950)
Basophils Absolute: 73 {cells}/uL (ref 0–200)
Basophils Relative: 1.3 %
Eosinophils Absolute: 39 {cells}/uL (ref 15–500)
Eosinophils Relative: 0.7 %
HCT: 36.7 % (ref 35.0–45.0)
Hemoglobin: 12.4 g/dL (ref 11.7–15.5)
MCH: 34.8 pg — ABNORMAL HIGH (ref 27.0–33.0)
MCHC: 33.8 g/dL (ref 32.0–36.0)
MCV: 103.1 fL — ABNORMAL HIGH (ref 80.0–100.0)
MPV: 10.4 fL (ref 7.5–12.5)
Monocytes Relative: 12.2 %
Neutro Abs: 3741 {cells}/uL (ref 1500–7800)
Neutrophils Relative %: 66.8 %
Platelets: 165 10*3/uL (ref 140–400)
RBC: 3.56 10*6/uL — ABNORMAL LOW (ref 3.80–5.10)
RDW: 11.4 % (ref 11.0–15.0)
Total Lymphocyte: 19 %
WBC: 5.6 10*3/uL (ref 3.8–10.8)

## 2023-04-01 LAB — COMPLETE METABOLIC PANEL WITH GFR
AG Ratio: 2.1 (calc) (ref 1.0–2.5)
ALT: 9 U/L (ref 6–29)
AST: 18 U/L (ref 10–35)
Albumin: 4 g/dL (ref 3.6–5.1)
Alkaline phosphatase (APISO): 44 U/L (ref 37–153)
BUN: 14 mg/dL (ref 7–25)
CO2: 33 mmol/L — ABNORMAL HIGH (ref 20–32)
Calcium: 9.3 mg/dL (ref 8.6–10.4)
Chloride: 98 mmol/L (ref 98–110)
Creat: 0.62 mg/dL (ref 0.60–0.95)
Globulin: 1.9 g/dL (ref 1.9–3.7)
Glucose, Bld: 80 mg/dL (ref 65–139)
Potassium: 4.3 mmol/L (ref 3.5–5.3)
Sodium: 134 mmol/L — ABNORMAL LOW (ref 135–146)
Total Bilirubin: 0.6 mg/dL (ref 0.2–1.2)
Total Protein: 5.9 g/dL — ABNORMAL LOW (ref 6.1–8.1)
eGFR: 81 mL/min/{1.73_m2} (ref >=60)

## 2023-04-01 LAB — TSH: TSH: 0.88 m[IU]/L (ref 0.40–4.50)

## 2023-04-01 MED ORDER — DEBROX 6.5 % OT SOLN: 5.0000 [drp] | Freq: Every evening | OTIC | Status: AC

## 2023-04-01 NOTE — Telephone Encounter (Signed)
Message routed to PCP [place], Amy E, NP  

## 2023-04-05 ENCOUNTER — Other Ambulatory Visit: Payer: Self-pay | Admitting: Orthopedic Surgery

## 2023-04-05 DIAGNOSIS — F419 Anxiety disorder, unspecified: Secondary | ICD-10-CM

## 2023-04-05 NOTE — Telephone Encounter (Signed)
 Pharmacy requested refill.  Epic LR: 03/10/2023 Contract Date:09/30/2022  Pended Rx and sent to Hazle Nordmann, NP for approval.

## 2023-04-07 ENCOUNTER — Encounter: Payer: Self-pay | Admitting: Orthopedic Surgery

## 2023-04-07 NOTE — Telephone Encounter (Signed)
Message routed to PCP [place], Amy E, NP  

## 2023-04-22 ENCOUNTER — Encounter: Payer: Self-pay | Admitting: Orthopedic Surgery

## 2023-04-22 ENCOUNTER — Other Ambulatory Visit: Payer: Self-pay | Admitting: Orthopedic Surgery

## 2023-04-22 DIAGNOSIS — H00019 Hordeolum externum unspecified eye, unspecified eyelid: Secondary | ICD-10-CM

## 2023-04-22 MED ORDER — ERYTHROMYCIN 5 MG/GM OP OINT: 1.0000 | TOPICAL_OINTMENT | Freq: Two times a day (BID) | OPHTHALMIC | 0 refills | Status: AC

## 2023-04-29 ENCOUNTER — Other Ambulatory Visit: Payer: Medicare HMO

## 2023-05-09 DIAGNOSIS — R399 Unspecified symptoms and signs involving the genitourinary system: Secondary | ICD-10-CM | POA: Diagnosis not present

## 2023-05-18 DIAGNOSIS — M25551 Pain in right hip: Secondary | ICD-10-CM | POA: Diagnosis not present

## 2023-05-18 DIAGNOSIS — M25511 Pain in right shoulder: Secondary | ICD-10-CM | POA: Diagnosis not present

## 2023-05-18 DIAGNOSIS — M25512 Pain in left shoulder: Secondary | ICD-10-CM | POA: Diagnosis not present

## 2023-05-18 DIAGNOSIS — M7062 Trochanteric bursitis, left hip: Secondary | ICD-10-CM | POA: Diagnosis not present

## 2023-05-18 DIAGNOSIS — Z79899 Other long term (current) drug therapy: Secondary | ICD-10-CM | POA: Diagnosis not present

## 2023-05-18 DIAGNOSIS — M25552 Pain in left hip: Secondary | ICD-10-CM | POA: Diagnosis not present

## 2023-05-18 DIAGNOSIS — M2550 Pain in unspecified joint: Secondary | ICD-10-CM | POA: Diagnosis not present

## 2023-05-18 DIAGNOSIS — M810 Age-related osteoporosis without current pathological fracture: Secondary | ICD-10-CM | POA: Diagnosis not present

## 2023-05-18 DIAGNOSIS — M1990 Unspecified osteoarthritis, unspecified site: Secondary | ICD-10-CM | POA: Diagnosis not present

## 2023-05-24 DIAGNOSIS — R32 Unspecified urinary incontinence: Secondary | ICD-10-CM | POA: Diagnosis not present

## 2023-05-26 DIAGNOSIS — M810 Age-related osteoporosis without current pathological fracture: Secondary | ICD-10-CM | POA: Diagnosis not present

## 2023-05-26 DIAGNOSIS — Z79899 Other long term (current) drug therapy: Secondary | ICD-10-CM | POA: Diagnosis not present

## 2023-05-26 DIAGNOSIS — M1990 Unspecified osteoarthritis, unspecified site: Secondary | ICD-10-CM | POA: Diagnosis not present

## 2023-05-26 DIAGNOSIS — M2550 Pain in unspecified joint: Secondary | ICD-10-CM | POA: Diagnosis not present

## 2023-05-26 DIAGNOSIS — M25511 Pain in right shoulder: Secondary | ICD-10-CM | POA: Diagnosis not present

## 2023-05-26 DIAGNOSIS — M25551 Pain in right hip: Secondary | ICD-10-CM | POA: Diagnosis not present

## 2023-05-26 DIAGNOSIS — M25552 Pain in left hip: Secondary | ICD-10-CM | POA: Diagnosis not present

## 2023-05-26 DIAGNOSIS — M25512 Pain in left shoulder: Secondary | ICD-10-CM | POA: Diagnosis not present

## 2023-05-26 DIAGNOSIS — M7062 Trochanteric bursitis, left hip: Secondary | ICD-10-CM | POA: Diagnosis not present

## 2023-05-31 ENCOUNTER — Encounter: Payer: Self-pay | Admitting: Orthopedic Surgery

## 2023-05-31 ENCOUNTER — Other Ambulatory Visit: Payer: Self-pay | Admitting: Orthopedic Surgery

## 2023-05-31 DIAGNOSIS — E039 Hypothyroidism, unspecified: Secondary | ICD-10-CM

## 2023-05-31 NOTE — Telephone Encounter (Signed)
Message routed to PCP [place], Amy E, NP  

## 2023-05-31 NOTE — Telephone Encounter (Signed)
 Pharmacy comment: Mylan is on indefinite backorder from a recall. We will need to change to Lupin manuf which is avaliable.

## 2023-06-01 NOTE — Telephone Encounter (Signed)
 I have completed form (based on order from 05/25/2022) for rehab medical,  attached last 2 OV notes, insurance and demographics. Once form is signed it can be further processed by the administrative staff by faxing to [phone].  No new order is needed at this time as the form serves as the order. Placed in Amy's review and sign folder

## 2023-06-07 ENCOUNTER — Other Ambulatory Visit: Payer: Self-pay | Admitting: Orthopedic Surgery

## 2023-06-07 DIAGNOSIS — R2681 Unsteadiness on feet: Secondary | ICD-10-CM

## 2023-06-07 DIAGNOSIS — R5381 Other malaise: Secondary | ICD-10-CM

## 2023-06-07 NOTE — Telephone Encounter (Signed)
Message routed to PCP [place], Amy E, NP  

## 2023-06-16 ENCOUNTER — Other Ambulatory Visit: Payer: Self-pay | Admitting: Orthopedic Surgery

## 2023-06-16 DIAGNOSIS — I10 Essential (primary) hypertension: Secondary | ICD-10-CM

## 2023-06-17 DIAGNOSIS — R399 Unspecified symptoms and signs involving the genitourinary system: Secondary | ICD-10-CM | POA: Diagnosis not present

## 2023-06-28 ENCOUNTER — Ambulatory Visit (INDEPENDENT_AMBULATORY_CARE_PROVIDER_SITE_OTHER): Admitting: Podiatry

## 2023-06-28 ENCOUNTER — Encounter: Payer: Self-pay | Admitting: Podiatry

## 2023-06-28 DIAGNOSIS — M79675 Pain in left toe(s): Secondary | ICD-10-CM | POA: Diagnosis not present

## 2023-06-28 DIAGNOSIS — B351 Tinea unguium: Secondary | ICD-10-CM | POA: Diagnosis not present

## 2023-06-28 DIAGNOSIS — G629 Polyneuropathy, unspecified: Secondary | ICD-10-CM | POA: Diagnosis not present

## 2023-06-28 DIAGNOSIS — M79674 Pain in right toe(s): Secondary | ICD-10-CM

## 2023-06-28 NOTE — Progress Notes (Signed)
 This patient returns to my office for at risk foot care.  This patient requires this care by a professional since this patient will be at risk due to having severe venous stasis. This patient is unable to cut nails herself since the patient cannot reach her nails.These nails are painful walking and wearing shoes.  This patient presents for at risk foot care today.  General Appearance  Alert, conversant and in no acute stress.  Vascular  Dorsalis pedis and posterior tibial  pulses are palpable  bilaterally.  Capillary return is within normal limits  bilaterally. Temperature is within normal limits  bilaterally.  Neurologic  Senn-Weinstein monofilament wire test within normal limits  bilaterally. Muscle power within normal limits bilaterally.  Nails Thick disfigured discolored nails with subungual debris  from hallux to fifth toes bilaterally. No evidence of bacterial infection or drainage bilaterally.  Orthopedic  No limitations of motion  feet .  No crepitus or effusions noted.  No bony pathology or digital deformities noted.  Skin  normotropic skin with no porokeratosis noted bilaterally.  No signs of infections or ulcers noted.  Distal corn second toe right foot.  Padding dispensed.    Onychomycosis  Pain in right toes  Pain in left toes  Consent was obtained for treatment procedures.   Mechanical debridement of nails 1-5  bilaterally performed with a nail nipper.  Filed with dremel without incident.    Return office visit  10 weeks.                   Told patient to return for periodic foot care and evaluation due to potential at risk complications.   Ruffin Cotton DPM

## 2023-06-29 ENCOUNTER — Encounter: Payer: Self-pay | Admitting: Orthopedic Surgery

## 2023-06-30 ENCOUNTER — Ambulatory Visit

## 2023-07-04 ENCOUNTER — Other Ambulatory Visit: Payer: Self-pay

## 2023-07-04 ENCOUNTER — Ambulatory Visit

## 2023-07-04 DIAGNOSIS — R2689 Other abnormalities of gait and mobility: Secondary | ICD-10-CM

## 2023-07-04 DIAGNOSIS — M6281 Muscle weakness (generalized): Secondary | ICD-10-CM

## 2023-07-04 NOTE — Therapy (Signed)
 Patient Name: Kayla Lloyd MRN: [id number] DOB:11/06/1923, 89 y.o., female Today's Date: 07/04/2023  Pt present with granddaughter who is primary caregiver. They came in today with hoping to get a small power chair that can be easily folded and put in the car by grand daughter so patient can use it out in the community. Patient is able to ambulate small distances with walker and is currently using transport chair which is pushed by grand daughter for longer distance mobility. Patient and grand daugther both educated that insurance companies no longer pay for folding power chairs, scooters. We discussed options with scooters, which will be purchased out of pocket. Pt and caregiver educated that scooters are not always safest options because they can be easily tipped over. Other option was group 2 power chair which cannot be folded and it can way upto 150 lbs. They would need a ramp for the house, trailer hitch and trailer basket if they take it out in the community. Patient and caregiver opted not to perform PT evaluation today as they are thinking of purchasing scooter out of pocket which fits their needs better.  Kristine Phalen, PT 07/04/2023, 2:19 PM

## 2023-07-15 ENCOUNTER — Encounter: Payer: Self-pay | Admitting: Orthopedic Surgery

## 2023-07-19 ENCOUNTER — Telehealth: Admitting: Orthopedic Surgery

## 2023-07-19 DIAGNOSIS — M810 Age-related osteoporosis without current pathological fracture: Secondary | ICD-10-CM | POA: Diagnosis not present

## 2023-07-19 NOTE — Telephone Encounter (Signed)
 Received Handicap Placard. Filled out and placed in Amy's Folder to review, fill out and sign.

## 2023-07-19 NOTE — Telephone Encounter (Signed)
 Pt's granddaughter dropped off application for renewal of disability placard. She says she will be back on Thursday to pick it up.  Please be advised.

## 2023-07-22 NOTE — Telephone Encounter (Signed)
 I called and I was unable to leave a voicemail because voicemail box is full. I will also send MyChart message as another form of communication.

## 2023-08-22 ENCOUNTER — Other Ambulatory Visit: Payer: Self-pay | Admitting: Orthopedic Surgery

## 2023-08-22 ENCOUNTER — Encounter: Payer: Self-pay | Admitting: Orthopedic Surgery

## 2023-08-22 DIAGNOSIS — I10 Essential (primary) hypertension: Secondary | ICD-10-CM

## 2023-08-23 ENCOUNTER — Other Ambulatory Visit: Payer: Self-pay | Admitting: Orthopedic Surgery

## 2023-08-23 DIAGNOSIS — I10 Essential (primary) hypertension: Secondary | ICD-10-CM

## 2023-08-25 ENCOUNTER — Other Ambulatory Visit: Payer: Self-pay | Admitting: Orthopedic Surgery

## 2023-08-25 DIAGNOSIS — F419 Anxiety disorder, unspecified: Secondary | ICD-10-CM

## 2023-08-31 ENCOUNTER — Other Ambulatory Visit: Payer: Self-pay | Admitting: Orthopedic Surgery

## 2023-08-31 DIAGNOSIS — K219 Gastro-esophageal reflux disease without esophagitis: Secondary | ICD-10-CM

## 2023-09-06 ENCOUNTER — Ambulatory Visit (INDEPENDENT_AMBULATORY_CARE_PROVIDER_SITE_OTHER): Admitting: Podiatry

## 2023-09-06 ENCOUNTER — Encounter: Payer: Self-pay | Admitting: Podiatry

## 2023-09-06 DIAGNOSIS — G629 Polyneuropathy, unspecified: Secondary | ICD-10-CM

## 2023-09-06 DIAGNOSIS — B351 Tinea unguium: Secondary | ICD-10-CM | POA: Diagnosis not present

## 2023-09-06 DIAGNOSIS — M79675 Pain in left toe(s): Secondary | ICD-10-CM

## 2023-09-06 DIAGNOSIS — M79674 Pain in right toe(s): Secondary | ICD-10-CM

## 2023-09-06 NOTE — Progress Notes (Signed)
 This patient returns to my office for at risk foot care.  This patient requires this care by a professional since this patient will be at risk due to having severe venous stasis. This patient is unable to cut nails herself since the patient cannot reach her nails.These nails are painful walking and wearing shoes.  This patient presents for at risk foot care today.  General Appearance  Alert, conversant and in no acute stress.  Vascular  Dorsalis pedis and posterior tibial  pulses are palpable  bilaterally.  Capillary return is within normal limits  bilaterally. Temperature is within normal limits  bilaterally.  Neurologic  Senn-Weinstein monofilament wire test within normal limits  bilaterally. Muscle power within normal limits bilaterally.  Nails Thick disfigured discolored nails with subungual debris  from hallux to fifth toes bilaterally. No evidence of bacterial infection or drainage bilaterally.  Orthopedic  No limitations of motion  feet .  No crepitus or effusions noted.  No bony pathology or digital deformities noted.  Skin  normotropic skin with no porokeratosis noted bilaterally.  No signs of infections or ulcers noted.  Distal corn second toe right foot.  Padding dispensed.    Onychomycosis  Pain in right toes  Pain in left toes  Consent was obtained for treatment procedures.   Mechanical debridement of nails 1-5  bilaterally performed with a nail nipper.  Filed with dremel without incident.    Return office visit  10 weeks.                   Told patient to return for periodic foot care and evaluation due to potential at risk complications.   Cordella Bold DPM

## 2023-09-13 DIAGNOSIS — Z79899 Other long term (current) drug therapy: Secondary | ICD-10-CM | POA: Diagnosis not present

## 2023-09-13 DIAGNOSIS — M810 Age-related osteoporosis without current pathological fracture: Secondary | ICD-10-CM | POA: Diagnosis not present

## 2023-09-13 DIAGNOSIS — M7062 Trochanteric bursitis, left hip: Secondary | ICD-10-CM | POA: Diagnosis not present

## 2023-09-13 DIAGNOSIS — M25552 Pain in left hip: Secondary | ICD-10-CM | POA: Diagnosis not present

## 2023-09-13 DIAGNOSIS — M25551 Pain in right hip: Secondary | ICD-10-CM | POA: Diagnosis not present

## 2023-09-13 DIAGNOSIS — M1990 Unspecified osteoarthritis, unspecified site: Secondary | ICD-10-CM | POA: Diagnosis not present

## 2023-09-13 DIAGNOSIS — M25511 Pain in right shoulder: Secondary | ICD-10-CM | POA: Diagnosis not present

## 2023-09-13 DIAGNOSIS — M25512 Pain in left shoulder: Secondary | ICD-10-CM | POA: Diagnosis not present

## 2023-09-13 DIAGNOSIS — M2550 Pain in unspecified joint: Secondary | ICD-10-CM | POA: Diagnosis not present

## 2023-09-24 ENCOUNTER — Other Ambulatory Visit: Payer: Self-pay | Admitting: Orthopedic Surgery

## 2023-09-24 DIAGNOSIS — I10 Essential (primary) hypertension: Secondary | ICD-10-CM

## 2023-09-27 DIAGNOSIS — R32 Unspecified urinary incontinence: Secondary | ICD-10-CM | POA: Diagnosis not present

## 2023-09-29 ENCOUNTER — Encounter: Payer: Self-pay | Admitting: Orthopedic Surgery

## 2023-09-29 DIAGNOSIS — F419 Anxiety disorder, unspecified: Secondary | ICD-10-CM

## 2023-09-29 MED ORDER — ALPRAZOLAM 0.25 MG PO TABS: 0.2500 mg | ORAL_TABLET | Freq: Two times a day (BID) | ORAL | 5 refills | Status: AC | PRN

## 2023-09-29 NOTE — Telephone Encounter (Signed)
 Message routed to PCP Gil Greig BRAVO, NP . Medication pending for approval.

## 2023-09-29 NOTE — Telephone Encounter (Signed)
 MyChart message has been sent to patient notifying her of prescription refill. I have also added to appointment notes for 10/06/2023 Update Contract due to last contract being signed 09/30/2022. Message routed to PCP Gil Greig BRAVO, NP as RICK.

## 2023-10-06 ENCOUNTER — Encounter: Payer: Self-pay | Admitting: Orthopedic Surgery

## 2023-10-06 ENCOUNTER — Ambulatory Visit: Admitting: Orthopedic Surgery

## 2023-10-06 VITALS — BP 116/60 | HR 84 | Temp 96.2°F | Resp 16 | Ht 59.0 in | Wt 113.4 lb

## 2023-10-06 DIAGNOSIS — M810 Age-related osteoporosis without current pathological fracture: Secondary | ICD-10-CM

## 2023-10-06 DIAGNOSIS — Z23 Encounter for immunization: Secondary | ICD-10-CM

## 2023-10-06 DIAGNOSIS — K219 Gastro-esophageal reflux disease without esophagitis: Secondary | ICD-10-CM | POA: Diagnosis not present

## 2023-10-06 DIAGNOSIS — N3946 Mixed incontinence: Secondary | ICD-10-CM

## 2023-10-06 DIAGNOSIS — I10 Essential (primary) hypertension: Secondary | ICD-10-CM | POA: Diagnosis not present

## 2023-10-06 DIAGNOSIS — M79661 Pain in right lower leg: Secondary | ICD-10-CM

## 2023-10-06 DIAGNOSIS — E559 Vitamin D deficiency, unspecified: Secondary | ICD-10-CM | POA: Diagnosis not present

## 2023-10-06 DIAGNOSIS — M79662 Pain in left lower leg: Secondary | ICD-10-CM

## 2023-10-06 DIAGNOSIS — F419 Anxiety disorder, unspecified: Secondary | ICD-10-CM | POA: Diagnosis not present

## 2023-10-06 DIAGNOSIS — N3281 Overactive bladder: Secondary | ICD-10-CM

## 2023-10-06 LAB — CBC WITH DIFFERENTIAL/PLATELET
Absolute Lymphocytes: 1080 {cells}/uL (ref 850–3900)
Absolute Monocytes: 829 {cells}/uL (ref 200–950)
Basophils Absolute: 67 {cells}/uL (ref 0–200)
Basophils Relative: 0.9 %
Eosinophils Absolute: 52 {cells}/uL (ref 15–500)
Eosinophils Relative: 0.7 %
HCT: 36.7 % (ref 35.0–45.0)
Hemoglobin: 12.2 g/dL (ref 11.7–15.5)
MCH: 35.3 pg — ABNORMAL HIGH (ref 27.0–33.0)
MCHC: 33.2 g/dL (ref 32.0–36.0)
MCV: 106.1 fL — ABNORMAL HIGH (ref 80.0–100.0)
MPV: 10.5 fL (ref 7.5–12.5)
Monocytes Relative: 11.2 %
Neutro Abs: 5372 {cells}/uL (ref 1500–7800)
Neutrophils Relative %: 72.6 %
Platelets: 138 Thousand/uL — ABNORMAL LOW (ref 140–400)
RBC: 3.46 Million/uL — ABNORMAL LOW (ref 3.80–5.10)
RDW: 12 % (ref 11.0–15.0)
Total Lymphocyte: 14.6 %
WBC: 7.4 Thousand/uL (ref 3.8–10.8)

## 2023-10-06 LAB — COMPREHENSIVE METABOLIC PANEL WITH GFR
AG Ratio: 2.1 (calc) (ref 1.0–2.5)
ALT: 8 U/L (ref 6–29)
AST: 16 U/L (ref 10–35)
Albumin: 4 g/dL (ref 3.6–5.1)
Alkaline phosphatase (APISO): 44 U/L (ref 37–153)
BUN: 21 mg/dL (ref 7–25)
CO2: 30 mmol/L (ref 20–32)
Calcium: 9.1 mg/dL (ref 8.6–10.4)
Chloride: 97 mmol/L — ABNORMAL LOW (ref 98–110)
Creat: 0.63 mg/dL (ref 0.60–0.95)
Globulin: 1.9 g/dL (ref 1.9–3.7)
Glucose, Bld: 92 mg/dL (ref 65–99)
Potassium: 4.3 mmol/L (ref 3.5–5.3)
Sodium: 134 mmol/L — ABNORMAL LOW (ref 135–146)
Total Bilirubin: 0.6 mg/dL (ref 0.2–1.2)
Total Protein: 5.9 g/dL — ABNORMAL LOW (ref 6.1–8.1)
eGFR: 81 mL/min/1.73m2 (ref >=60)

## 2023-10-06 LAB — VITAMIN D 25 HYDROXY (VIT D DEFICIENCY, FRACTURES): Vit D, 25-Hydroxy: 89 ng/mL (ref 30–100)

## 2023-10-06 NOTE — Progress Notes (Unsigned)
 Careteam: Patient Care Team: Gil Greig BRAVO, NP as PCP - General (Adult Health Nurse Practitioner) Curt Lighter, MD (Rheumatology)  Seen by: [name], AGNP-C  PLACE OF SERVICE:  [facility]  Advanced Directive information Does Patient Have a Medical Advance Directive?: Yes, Type of Advance Directive: Healthcare Power of [place];Living will;Out of facility DNR (pink MOST or yellow form), Does patient want to make changes to medical advance directive?: No - Patient declined  Allergies  Allergen Reactions   Brimonidine    Codeine Other (See Comments)   Levofloxacin Other (See Comments)   Lipitor [Atorvastatin]    Meloxicam    Myrbetriq [Mirabegron Er]    Oxycodone    Pregabalin    Sulfa  Antibiotics Other (See Comments)   Timolol Maleate    Zolpidem    Gabapentin  Rash    Pt did not like the way it made her feel    Chief Complaint  Patient presents with   Medical Management of Chronic Issues    6 month follow up. Discuss the need for Pne vaccine, Influenza vaccine, Shingrix vaccine, and AWV.   Form Completion    Sign Contract     HPI: Patient is a 89 y.o. female seen today for medical management of chronic conditions.   Discussed the use of AI scribe software for clinical note transcription with the patient, who gave verbal consent to proceed.  History of Present Illness   Kayla Lloyd is a 89 year old female who presents for a routine checkup and management of chronic leg pain and skin sensitivity.  She experiences chronic leg pain and skin sensitivity, describing her legs as 'just skin' and cringing at the slightest touch. She primarily uses a wheelchair for mobility outside the home and alternates between a cane and a walker indoors. Her caregiver notes that her legs are very sensitive, and she has a history of a leg sore that took a long time to heal.   She experiences neuropathy in her feet, which are soaked nightly. Her caregiver is concerned about  potential skin breakdown and infection due to her thin skin and frequent bruising. Discussed using neoprene fabric for additional padding to prevent injuries.  Her nutritional intake includes Ensure with breakfast and another meal, supplemented with high-protein cookies. Her weight was reported as 111 pounds at the last visit, and she continues to consume Ensure for additional calories. Her caregiver is not concerned about diabetes despite a family history, as her glucose levels have been stable.  H/o incontinence. She has a history of urinary tract infections but no recent urinary issues. There have been no falls or accidents since the last visit. She received a flu shot today. Discussed benefits of Prevnar 20.   Her memory is described as 'a little scattered,' but she does not believe she has dementia. She has a family history of dementia, with a brother who passed away from the condition. Her caregiver uses a Air cabin crew of a 'Engineering geologist' to describe her memory capacity.  She is currently on Cymbalta  60 mg twice daily for chronic pain management, particularly for her lower leg pain. Symptoms of serotonin syndrome due to the high dosage discussed with caregiver. She uses Tylenol  for pain management and has tried various topical treatments for her feet.       Review of Systems:  Review of Systems  Unable to perform ROS: Age    Past Medical History:  Diagnosis Date   Anxiety    Arthritis  Phreesia 03/05/2020   Bowel obstruction (HCC) 1950   Bursitis    Cancer of septum of nose (HCC)    melanoma   Constipation    At times   Depression    GERD (gastroesophageal reflux disease)    History of open leg wound    Hypertension    Hypothyroidism    Osteoporosis    Phreesia 03/05/2020   Swallowing difficulty    Thyroid  activity decreased    Torn rotator cuff    Past Surgical History:  Procedure Laterality Date   ABDOMINAL HYSTERECTOMY N/A    Phreesia 03/05/2020   APPLICATION OF A-CELL OF  EXTREMITY Left 10/17/2019   Procedure: APPLICATION OF INTEGRA BILAYER WOUND MATRIX OF EXTREMITY;  Surgeon: Lowery Estefana RAMAN, DO;  Location: [facility];  Service: Plastics;  Laterality: Left;   BIOPSY  12/07/2017   Procedure: BIOPSY;  Surgeon: Wilhelmenia Aloha Raddle., MD;  Location: [facility];  Service: Gastroenterology;;   CHOLECYSTECTOMY     ESOPHAGOGASTRODUODENOSCOPY (EGD) WITH PROPOFOL  N/A 12/07/2017   Procedure: ESOPHAGOGASTRODUODENOSCOPY (EGD) WITH PROPOFOL ;  Surgeon: Wilhelmenia Aloha Raddle., MD;  Location: [facility];  Service: Gastroenterology;  Laterality: N/A;  May need Dilation   HERNIA REPAIR N/A    Phreesia 03/05/2020   hysterecrtomy     I & D EXTREMITY Left 10/17/2019   Procedure: IRRIGATION AND DEBRIDEMENT EXTREMITY;  Surgeon: Lowery Estefana RAMAN, DO;  Location: [facility];  Service: Plastics;  Laterality: Left;  45 min   JOINT REPLACEMENT N/A    Phreesia 03/05/2020   left hip surgery     ROTATOR CUFF REPAIR Right    SMALL INTESTINE SURGERY     Social History:   reports that she has never smoked. She has never used smokeless tobacco. She reports current alcohol use of about 1.0 standard drink of alcohol per week. She reports that she does not use drugs.  Family History  Problem Relation Age of Onset   Uterine cancer Mother    Hypertension Father    Kidney failure Brother    Cancer Son    Cancer Daughter    Colon cancer Neg Hx    Esophageal cancer Neg Hx    Liver disease Neg Hx    Inflammatory bowel disease Neg Hx    Pancreatic cancer Neg Hx    Rectal cancer Neg Hx    Stomach cancer Neg Hx     Medications: Patient's Medications  New Prescriptions   No medications on file  Previous Medications   ACETAMINOPHEN  (TYLENOL ) 500 MG TABLET    Take 500 mg by mouth as needed.   ALPRAZOLAM  (XANAX ) 0.25 MG TABLET    Take 1 tablet (0.25 mg total) by mouth 2 (two) times daily as needed. for anxiety   B COMPLEX VITAMINS CAPSULE    Take 1  capsule by mouth daily.   CETIRIZINE  (ZYRTEC ) 10 MG TABLET    TAKE 1 TABLET BY MOUTH ONCE DAILY   CHOLECALCIFEROL (CVS D3) 125 MCG (5000 UT) CAPSULE    Take 5,000 Units by mouth daily.   COENZYME Q10 (COQ10) 100 MG CAPS    Take by mouth.   CRANBERRY-D MANNOSE PO    Take 1,000 mg by mouth at bedtime. Mixed in bedtime cocktail   CYANOCOBALAMIN 1000 MCG/ML LIQD    Take 1 tablet by mouth daily in the afternoon.   DULOXETINE  (CYMBALTA ) 60 MG CAPSULE    Take 1 capsule (60 mg total) by mouth 2 (two) times daily.  ENSURE (ENSURE)    Take 1 Can by mouth in the morning and at bedtime.   HYDROCORTISONE  CREAM 0.5 %    Apply 1 Application topically as needed.   LEVOTHYROXINE  (SYNTHROID ) 75 MCG TABLET    TAKE 1 TABLET BY MOUTH ONCE DAILY   LOSARTAN  (COZAAR ) 25 MG TABLET    Take 1 tablet (25 mg total) by mouth daily.   MENAQUINONE-7 (VITAMIN K2 PO)    Take 100 mcg by mouth daily.   MENTHOL, TOPICAL ANALGESIC, 154 MG PADS    Place 1 each onto the skin daily as needed (for hip pain.).   MISC NATURAL PRODUCTS (PUMPKIN SEED OIL PO)    Take 1,000 mg by mouth at bedtime. Mixed into bedtime cocktail   NUTRITIONAL SUPPLEMENTS (FEEDING SUPPLEMENT, PEDIASURE 1.5,) LIQD LIQUID    Take 237 mLs by mouth 2 (two) times daily between meals.   PANTOPRAZOLE  (PROTONIX ) 40 MG TABLET    TAKE 1 TABLET BY MOUTH 2 TIMES DAILY   PROBIOTIC PRODUCT (DAILY PROBIOTIC PO)    Take by mouth.   PROLIA  60 MG/ML SOSY INJECTION    Take 60 mg by mouth every 6 (six) months.   SIMETHICONE (GAS RELIEF PO)    Take 2-3 Pieces by mouth daily. Gel Capsules   TROLAMINE SALICYLATE (ASPERCREME) 10 % CREAM    Apply 1 application topically as needed for muscle pain.   WHITE PETROLATUM (VASELINE) GEL    Apply 1 Application topically as needed for lip care.  Modified Medications   No medications on file  Discontinued Medications   No medications on file    Physical Exam:  Vitals:   10/06/23 1307  BP: 116/60  Pulse: 84  Resp: 16  Temp: (!) 96.2 F  (35.7 C)  SpO2: 96%  Weight: 113 lb 6.4 oz (51.4 kg)  Height: 4' 11 (1.499 m)   Body mass index is 22.9 kg/m. Wt Readings from Last 3 Encounters:  10/06/23 113 lb 6.4 oz (51.4 kg)  03/31/23 111 lb 6.4 oz (50.5 kg)  03/28/23 119 lb (54 kg)    Physical Exam Vitals reviewed.  Constitutional:      General: She is not in acute distress.    Appearance: She is underweight.  HENT:     Head: Normocephalic.     Ears:     Comments: HOH Eyes:     General:        Right eye: No discharge.        Left eye: No discharge.  Cardiovascular:     Rate and Rhythm: Normal rate and regular rhythm.     Pulses: Normal pulses.     Heart sounds: Normal heart sounds.  Pulmonary:     Effort: Pulmonary effort is normal. No respiratory distress.     Breath sounds: Normal breath sounds. No wheezing or rales.  Abdominal:     General: Bowel sounds are normal. There is no distension.     Palpations: Abdomen is soft.     Tenderness: There is no abdominal tenderness.  Musculoskeletal:     Cervical back: Neck supple.     Right lower leg: No edema.     Left lower leg: No edema.     Comments: BLE tender to light touch  Skin:    General: Skin is warm.     Capillary Refill: Capillary refill takes less than 2 seconds.  Neurological:     General: No focal deficit present.     Mental Status: She is  alert. Mental status is at baseline.     Gait: Gait abnormal.  Psychiatric:        Mood and Affect: Mood normal.     Labs reviewed: Basic Metabolic Panel: Recent Labs    03/31/23 1415  NA 134*  K 4.3  CL 98  CO2 33*  GLUCOSE 80  BUN 14  CREATININE 0.62  CALCIUM  9.3  TSH 0.88   Liver Function Tests: Recent Labs    03/31/23 1415  AST 18  ALT 9  BILITOT 0.6  PROT 5.9*   No results for input(s): LIPASE, AMYLASE in the last 8760 hours. No results for input(s): AMMONIA in the last 8760 hours. CBC: Recent Labs    03/31/23 1415  WBC 5.6  NEUTROABS 3,741  HGB 12.4  HCT 36.7  MCV  103.1*  PLT 165   Lipid Panel: No results for input(s): CHOL, HDL, LDLCALC, TRIG, CHOLHDL, LDLDIRECT in the last 8760 hours. TSH: Recent Labs    03/31/23 1415  TSH 0.88   A1C: No results found for: HGBA1C   Assessment/Plan 1. Need for influenza vaccination (Primary) - Flu vaccine HIGH DOSE PF(Fluzone Trivalent)  2. Essential hypertension - controlled, goal < 150/90 - BUN/creat 21/0.63 10/06/2023 - cont losartan  - CBC with Differential/Platelet - Complete Metabolic Panel with eGFR  3. Gastroesophageal reflux disease without esophagitis - hgb 12.2 10/06/2023 - cont pantoprazole   4. Anxiety - no recent panic attacks - cont Cymbalta  ans xanax   5. Mixed and stress urinary incontinence - cont frequent brief changes   6. Vitamin D  deficiency - vitamin D  level 89 10/06/2023 - cont vitamin D  - Vitamin D , 25-hydroxy  7. Age related osteoporosis, unspecified pathological fracture presence -  followed by rheumatology> f/u with Dr. Cindy 12/2023 - recent DEXA indicated worsening osteoporosis> Forteo recommended - cont Prolia   8. Pain in both lower legs - ongoing - associated with deconditioning and advanced age - on high dose Cymbalta > benefits outweigh risk - Na+ 134 10/06/2023  Total time: 38 minutes. Greater than 50% of total time spent doing patient education regarding health maintenance, HTN, incontinence, anxiety  and lower leg pain including symptom/medication management.     Next appt: Visit date not found  Milarose Savich Gil Lloyd  [facility] [phone]

## 2023-10-06 NOTE — Patient Instructions (Addendum)
 Recommend shin bumpers for lower legs to prevent injury  Recommend Prevnar 20 pneumonia vaccine at local pharmacy  Eat foods you enjoy> not worried about diabetes

## 2023-10-07 ENCOUNTER — Ambulatory Visit: Payer: Self-pay | Admitting: Orthopedic Surgery

## 2023-10-28 ENCOUNTER — Encounter: Payer: Self-pay | Admitting: Orthopedic Surgery

## 2023-11-03 DIAGNOSIS — R399 Unspecified symptoms and signs involving the genitourinary system: Secondary | ICD-10-CM | POA: Diagnosis not present

## 2023-11-10 ENCOUNTER — Encounter: Payer: Self-pay | Admitting: Orthopedic Surgery

## 2023-11-10 ENCOUNTER — Other Ambulatory Visit: Payer: Self-pay | Admitting: Orthopedic Surgery

## 2023-11-10 MED ORDER — CEPHALEXIN 500 MG PO CAPS: 500.0000 mg | ORAL_CAPSULE | Freq: Three times a day (TID) | ORAL | 0 refills | Status: AC

## 2023-11-10 NOTE — Progress Notes (Signed)
 10/09 urine culture indicated > 100,000 cfu/mL Proteus mirabilis. Family unable to get in touch with urology. Sensitivity reports notes sensitivity to Keflex . Prescription sent to [facility].

## 2023-11-11 ENCOUNTER — Other Ambulatory Visit: Payer: Self-pay | Admitting: Orthopedic Surgery

## 2023-11-11 DIAGNOSIS — N390 Urinary tract infection, site not specified: Secondary | ICD-10-CM

## 2023-11-11 NOTE — Telephone Encounter (Signed)
Message routed to PCP [place], Amy E, NP  

## 2023-11-21 ENCOUNTER — Ambulatory Visit (INDEPENDENT_AMBULATORY_CARE_PROVIDER_SITE_OTHER): Admitting: Podiatry

## 2023-11-21 ENCOUNTER — Encounter: Payer: Self-pay | Admitting: Podiatry

## 2023-11-21 DIAGNOSIS — M79674 Pain in right toe(s): Secondary | ICD-10-CM

## 2023-11-21 DIAGNOSIS — B351 Tinea unguium: Secondary | ICD-10-CM | POA: Diagnosis not present

## 2023-11-21 DIAGNOSIS — I878 Other specified disorders of veins: Secondary | ICD-10-CM

## 2023-11-21 DIAGNOSIS — M79675 Pain in left toe(s): Secondary | ICD-10-CM | POA: Diagnosis not present

## 2023-11-21 DIAGNOSIS — G629 Polyneuropathy, unspecified: Secondary | ICD-10-CM | POA: Diagnosis not present

## 2023-11-21 NOTE — Progress Notes (Signed)
 This patient returns to my office for at risk foot care.  This patient requires this care by a professional since this patient will be at risk due to having severe venous stasis. This patient is unable to cut nails herself since the patient cannot reach her nails.These nails are painful walking and wearing shoes.  This patient presents for at risk foot care today.  General Appearance  Alert, conversant and in no acute stress.  Vascular  Dorsalis pedis and posterior tibial  pulses are palpable  bilaterally.  Capillary return is within normal limits  bilaterally. Temperature is within normal limits  bilaterally.  Neurologic  Senn-Weinstein monofilament wire test within normal limits  bilaterally. Muscle power within normal limits bilaterally.  Nails Thick disfigured discolored nails with subungual debris  hallux nails   bilaterally. No evidence of bacterial infection or drainage bilaterally.  Orthopedic  No limitations of motion  feet .  No crepitus or effusions noted.  No bony pathology or digital deformities noted.  Skin  normotropic skin with no porokeratosis noted bilaterally.  No signs of infections or ulcers noted.   Onychomycosis  Pain in right toes  Pain in left toes  Consent was obtained for treatment procedures.   Mechanical debridement of nails 1-5  bilaterally performed with a nail nipper.  Filed with dremel without incident.    Return office visit  3 months                    Told patient to return for periodic foot care and evaluation due to potential at risk complications.   Cordella Bold DPM  tomma

## 2023-11-24 ENCOUNTER — Other Ambulatory Visit: Payer: Self-pay | Admitting: Orthopedic Surgery

## 2023-11-24 DIAGNOSIS — E039 Hypothyroidism, unspecified: Secondary | ICD-10-CM

## 2023-11-29 ENCOUNTER — Encounter: Payer: Self-pay | Admitting: Orthopedic Surgery

## 2023-11-29 ENCOUNTER — Other Ambulatory Visit: Payer: Self-pay | Admitting: Orthopedic Surgery

## 2023-11-29 MED ORDER — NITROFURANTOIN MONOHYD MACRO 100 MG PO CAPS: 100.0000 mg | ORAL_CAPSULE | Freq: Every day | ORAL | 1 refills | Status: AC

## 2023-12-06 DIAGNOSIS — R351 Nocturia: Secondary | ICD-10-CM | POA: Diagnosis not present

## 2023-12-06 DIAGNOSIS — R350 Frequency of micturition: Secondary | ICD-10-CM | POA: Diagnosis not present

## 2023-12-06 DIAGNOSIS — N3281 Overactive bladder: Secondary | ICD-10-CM | POA: Diagnosis not present

## 2023-12-06 DIAGNOSIS — R399 Unspecified symptoms and signs involving the genitourinary system: Secondary | ICD-10-CM | POA: Diagnosis not present

## 2023-12-06 DIAGNOSIS — N390 Urinary tract infection, site not specified: Secondary | ICD-10-CM | POA: Diagnosis not present

## 2024-02-06 ENCOUNTER — Encounter: Payer: Self-pay | Admitting: Podiatry

## 2024-02-06 ENCOUNTER — Ambulatory Visit (INDEPENDENT_AMBULATORY_CARE_PROVIDER_SITE_OTHER): Admitting: Podiatry

## 2024-02-06 DIAGNOSIS — M79675 Pain in left toe(s): Secondary | ICD-10-CM

## 2024-02-06 DIAGNOSIS — M79674 Pain in right toe(s): Secondary | ICD-10-CM

## 2024-02-06 DIAGNOSIS — B351 Tinea unguium: Secondary | ICD-10-CM | POA: Diagnosis not present

## 2024-02-06 DIAGNOSIS — I878 Other specified disorders of veins: Secondary | ICD-10-CM

## 2024-02-06 DIAGNOSIS — G629 Polyneuropathy, unspecified: Secondary | ICD-10-CM | POA: Diagnosis not present

## 2024-02-06 NOTE — Progress Notes (Signed)
 This patient returns to my office for at risk foot care.  This patient requires this care by a professional since this patient will be at risk due to having severe venous stasis. This patient is unable to cut nails herself since the patient cannot reach her nails.These nails are painful walking and wearing shoes.  This patient presents for at risk foot care today.  General Appearance  Alert, conversant and in no acute stress.  Vascular  Dorsalis pedis and posterior tibial  pulses are palpable  bilaterally.  Capillary return is within normal limits  bilaterally. Temperature is within normal limits  bilaterally.  Neurologic  Senn-Weinstein monofilament wire test within normal limits  bilaterally. Muscle power within normal limits bilaterally.  Nails Thick disfigured discolored nails with subungual debris  hallux nails   bilaterally. No evidence of bacterial infection or drainage bilaterally.  Orthopedic  No limitations of motion  feet .  No crepitus or effusions noted.  No bony pathology or digital deformities noted.  Skin  normotropic skin with no porokeratosis noted bilaterally.  No signs of infections or ulcers noted.   Onychomycosis  Pain in right toes  Pain in left toes  Consent was obtained for treatment procedures.   Mechanical debridement of nails 1-5  bilaterally performed with a nail nipper.  Filed with dremel without incident.    Return office visit  10 weeks                   Told patient to return for periodic foot care and evaluation due to potential at risk complications.   Cordella Bold DPM

## 2024-02-20 ENCOUNTER — Ambulatory Visit: Admitting: Podiatry

## 2024-02-20 ENCOUNTER — Other Ambulatory Visit: Payer: Self-pay | Admitting: Orthopedic Surgery

## 2024-02-29 ENCOUNTER — Other Ambulatory Visit: Payer: Self-pay | Admitting: Orthopedic Surgery

## 2024-02-29 DIAGNOSIS — K219 Gastro-esophageal reflux disease without esophagitis: Secondary | ICD-10-CM

## 2024-02-29 DIAGNOSIS — G629 Polyneuropathy, unspecified: Secondary | ICD-10-CM

## 2024-02-29 NOTE — Telephone Encounter (Signed)
 Pharmacy requested refill.  Pended Rx and sent to Ulyses Gandy, NP for approval.

## 2024-04-05 ENCOUNTER — Ambulatory Visit: Payer: Self-pay | Admitting: Orthopedic Surgery

## 2024-04-18 ENCOUNTER — Ambulatory Visit: Admitting: Podiatry
